# Patient Record
Sex: Female | Born: 1960 | Race: White | Hispanic: No | Marital: Married | Smoking: Former smoker
Health system: Southern US, Community
[De-identification: ages and names within clinical notes are randomized; demographics above are authoritative.]

## PROBLEM LIST (undated history)

## (undated) DIAGNOSIS — L219 Seborrheic dermatitis, unspecified: Secondary | ICD-10-CM

## (undated) DIAGNOSIS — K219 Gastro-esophageal reflux disease without esophagitis: Secondary | ICD-10-CM

## (undated) DIAGNOSIS — Z780 Asymptomatic menopausal state: Secondary | ICD-10-CM

## (undated) DIAGNOSIS — N2889 Other specified disorders of kidney and ureter: Secondary | ICD-10-CM

## (undated) DIAGNOSIS — I10 Essential (primary) hypertension: Secondary | ICD-10-CM

## (undated) DIAGNOSIS — K8020 Calculus of gallbladder without cholecystitis without obstruction: Secondary | ICD-10-CM

## (undated) DIAGNOSIS — D649 Anemia, unspecified: Secondary | ICD-10-CM

## (undated) DIAGNOSIS — R7303 Prediabetes: Secondary | ICD-10-CM

## (undated) DIAGNOSIS — R911 Solitary pulmonary nodule: Secondary | ICD-10-CM

## (undated) DIAGNOSIS — K862 Cyst of pancreas: Secondary | ICD-10-CM

## (undated) DIAGNOSIS — Z862 Personal history of diseases of the blood and blood-forming organs and certain disorders involving the immune mechanism: Secondary | ICD-10-CM

## (undated) DIAGNOSIS — Z8639 Personal history of other endocrine, nutritional and metabolic disease: Secondary | ICD-10-CM

## (undated) DIAGNOSIS — M1990 Unspecified osteoarthritis, unspecified site: Secondary | ICD-10-CM

## (undated) DIAGNOSIS — M25511 Pain in right shoulder: Secondary | ICD-10-CM

## (undated) DIAGNOSIS — N183 Chronic kidney disease, stage 3 unspecified: Secondary | ICD-10-CM

## (undated) DIAGNOSIS — G709 Myoneural disorder, unspecified: Secondary | ICD-10-CM

## (undated) DIAGNOSIS — Z905 Acquired absence of kidney: Secondary | ICD-10-CM

## (undated) DIAGNOSIS — T7840XA Allergy, unspecified, initial encounter: Secondary | ICD-10-CM

## (undated) DIAGNOSIS — M109 Gout, unspecified: Secondary | ICD-10-CM

## (undated) DIAGNOSIS — R0789 Other chest pain: Secondary | ICD-10-CM

## (undated) DIAGNOSIS — C189 Malignant neoplasm of colon, unspecified: Secondary | ICD-10-CM

## (undated) DIAGNOSIS — Z8739 Personal history of other diseases of the musculoskeletal system and connective tissue: Secondary | ICD-10-CM

## (undated) DIAGNOSIS — Z9289 Personal history of other medical treatment: Secondary | ICD-10-CM

## (undated) HISTORY — DX: Allergy, unspecified, initial encounter: T78.40XA

## (undated) HISTORY — PX: LIVER SURGERY: SHX698

## (undated) HISTORY — DX: Other chest pain: R07.89

## (undated) HISTORY — DX: Acquired absence of kidney: Z90.5

## (undated) HISTORY — DX: Anemia, unspecified: D64.9

## (undated) HISTORY — DX: Myoneural disorder, unspecified: G70.9

## (undated) HISTORY — DX: Personal history of diseases of the blood and blood-forming organs and certain disorders involving the immune mechanism: Z86.2

## (undated) HISTORY — DX: Solitary pulmonary nodule: R91.1

## (undated) HISTORY — DX: Chronic kidney disease, stage 3 (moderate): N18.3

## (undated) HISTORY — DX: Personal history of other endocrine, nutritional and metabolic disease: Z86.39

## (undated) HISTORY — DX: Gastro-esophageal reflux disease without esophagitis: K21.9

## (undated) HISTORY — PX: POLYPECTOMY: SHX149

## (undated) HISTORY — DX: Prediabetes: R73.03

## (undated) HISTORY — DX: Gout, unspecified: M10.9

## (undated) HISTORY — DX: Personal history of other medical treatment: Z92.89

## (undated) HISTORY — DX: Personal history of other diseases of the musculoskeletal system and connective tissue: Z87.39

## (undated) HISTORY — DX: Pain in right shoulder: M25.511

## (undated) HISTORY — DX: Essential (primary) hypertension: I10

## (undated) HISTORY — DX: Other specified disorders of kidney and ureter: N28.89

## (undated) HISTORY — DX: Chronic kidney disease, stage 3 unspecified: N18.30

## (undated) HISTORY — DX: Asymptomatic menopausal state: Z78.0

## (undated) HISTORY — DX: Calculus of gallbladder without cholecystitis without obstruction: K80.20

## (undated) HISTORY — PX: OTHER SURGICAL HISTORY: SHX169

## (undated) HISTORY — DX: Cyst of pancreas: K86.2

## (undated) HISTORY — DX: Seborrheic dermatitis, unspecified: L21.9

## (undated) HISTORY — DX: Unspecified osteoarthritis, unspecified site: M19.90

---
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
## 2001-08-12 HISTORY — PX: BREAST REDUCTION SURGERY: SHX8

## 2002-08-12 HISTORY — PX: LUMBAR LAMINECTOMY: SHX95

## 2012-07-24 ENCOUNTER — Encounter: Payer: Self-pay | Admitting: Family Medicine

## 2012-07-24 ENCOUNTER — Ambulatory Visit (INDEPENDENT_AMBULATORY_CARE_PROVIDER_SITE_OTHER): Payer: Managed Care, Other (non HMO) | Admitting: Family Medicine

## 2012-07-24 VITALS — BP 155/79 | HR 83 | Temp 98.2°F | Wt 189.0 lb

## 2012-07-24 DIAGNOSIS — J069 Acute upper respiratory infection, unspecified: Secondary | ICD-10-CM

## 2012-07-24 DIAGNOSIS — J209 Acute bronchitis, unspecified: Secondary | ICD-10-CM

## 2012-07-24 MED ORDER — ALBUTEROL SULFATE HFA 108 (90 BASE) MCG/ACT IN AERS: 2.0000 | INHALATION_SPRAY | Freq: Four times a day (QID) | RESPIRATORY_TRACT | Status: DC | PRN

## 2012-07-24 MED ORDER — BENZONATATE 200 MG PO CAPS: 200.0000 mg | ORAL_CAPSULE | Freq: Three times a day (TID) | ORAL | Status: DC | PRN

## 2012-07-24 NOTE — Progress Notes (Signed)
Office Note 07/26/2012  CC:  Chief Complaint  Patient presents with  . Establish Care    cough x 1 week    HPI:  Elaine Jenkins is a 51 y.o. White female who is here to establish care and discuss recent respiratory complaints. Patient's most recent primary MD: Dr. Sherryll Burger in [place], [place].  Nephrologist is Dr. Darel Hong in [place]. Old records were not reviewed prior to or during today's visit.  Pt presents complaining of respiratory symptoms for 8  days.  Primary symptoms are: nasal mucous, sinus congestion, cough.  Worst symptoms seems to be the cough.  Lately the symptoms seem to be improving some until last night when she had a bad coughing spell.  Feels pretty good today.  Question of fevers--"sweats at night". Spits out clear mucous.  She hears some intermittent wheezing that somewhat clears with position change. Pertinent negatives: no chest tightness or chest pain. Symptoms made worse by night time, talking.  Symptoms improved by nothing (no meds tried). Smoker? no Recent sick contact? unknown Muscle or joint aches? no Flu shot this season at least 2 wks ago? yes  Additional ROS: no n/v/d or abdominal pain.  No rash.  No neck stiffness.   +Mild fatigue.  +Mild appetite loss.     Past Medical History  Diagnosis Date  . HTN (hypertension)   . Gout   . 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 History of subacute thyroiditis   . History of anemia   . Postmenopausal   . History of blood transfusion 1963    Past Surgical History  Procedure Date  . Appendectomy 1964  . Nephrectomy 1964    Wilms tumor (right)  . Back surgery 2001    microdiscectomy  . Breast reduction surgery 1999    bilateral    Family History  Problem Relation Age of Onset  . Hypertension Mother   . Heart disease Mother   . Hypertension Father   . Heart disease Father     History   Social History  . Marital Status: Married    Spouse Name: N/A    Number of  Children: N/A  . Years of Education: N/A   Occupational History  . Not on file.   Social History Main Topics  . Smoking status: Former Games developer  . Smokeless tobacco: Never Used  . Alcohol Use: Yes     Comment: rare  . Drug Use: No  . Sexually Active: Not on file   Other Topics Concern  . 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 No Tobacco.  Rare alcohol.  No drug use.     MEDS: Diovan 160mg  qd, Uloric 40mg  po qd  Allergies  Allergen Reactions  . Aldomet (Methyldopa)     Severe bone suppression  . Latex Rash    Blisters, rash    ROS Review of Systems  Constitutional: Negative for fever and fatigue.  HENT: Negative for sore throat.        See HPI  Eyes: Negative for visual disturbance.  Respiratory:       See HPI  Cardiovascular: Negative for chest pain.  Gastrointestinal: Negative for nausea and abdominal pain.  Genitourinary: Negative for dysuria.  Musculoskeletal: Negative for back pain and joint swelling.  Skin: Negative for rash.  Neurological: Negative for weakness and headaches.  Hematological: Negative for adenopathy.  PE; Blood pressure 155/79, pulse 83, temperature 98.2 F (36.8 C), temperature source Temporal, weight 189 lb (85.73 kg), SpO2 97.00%. VS: noted--normal except systolic bp up slightly. Gen: alert, NAD, NONTOXIC APPEARING. HEENT: eyes without injection, drainage, or swelling.  Ears: EACs clear, TMs with normal light reflex and landmarks.  Nose: Clear rhinorrhea, with some dried, crusty exudate adherent to mildly injected mucosa.  No purulent d/c.  No paranasal sinus TTP.  No facial swelling.  Throat and mouth without focal lesion.  No pharyngial swelling, erythema, or exudate.   Neck: supple, no LAD.   LUNGS: CTA bilat, nonlabored resps.  Mild prolongation of expiratory phase.  No significant post-exhalation coughing. CV: RRR, no  m/r/g. ABD: soft, NT, ND, BS normal.  No HSM, no mass, no bruit. EXT: no c/c/e SKIN: no rash   Pertinent labs:  None today  ASSESSMENT AND PLAN:   New pt: obtain both PMD and nephrologist's old records.  Acute bronchitis With URI as well, plus the slightest hint of RAD. No systemic steroids warranted.  No sign of bacterial infection. Rx'd tessalon perles 200mg  q8h prn and gave a sample of ventolin HFA to use 2 puffs q6h prn wheezing/dry cough/chest tightness.   HTN: historically good control.  Will not change meds based on today's bp reading but encouraged pt to monitor bp regularly between now and next f/u and bring these in for review.  With one kidney we need to have an aggressive bp goal of <130/80 with her.  Gout: well controlled since getting on uloric.  Continue this med.    She'll return in 7 or more days when feeling better and get fasting labs (30 min app and we'll fax copies to her nephrologist in [place], whom she'll continue to see periodically.  Return for f/u in 7 or more days--30 min appt.

## 2012-07-26 ENCOUNTER — Encounter: Payer: Self-pay | Admitting: Family Medicine

## 2012-07-26 DIAGNOSIS — J209 Acute bronchitis, unspecified: Secondary | ICD-10-CM | POA: Insufficient documentation

## 2012-07-26 NOTE — Assessment & Plan Note (Signed)
With URI as well, plus the slightest hint of RAD. No systemic steroids warranted.  No sign of bacterial infection. Rx'd tessalon perles 200mg  q8h prn and gave a sample of ventolin HFA to use 2 puffs q6h prn wheezing/dry cough/chest tightness.

## 2012-08-06 ENCOUNTER — Ambulatory Visit: Payer: Managed Care, Other (non HMO) | Admitting: Family Medicine

## 2012-08-18 ENCOUNTER — Ambulatory Visit: Payer: Managed Care, Other (non HMO) | Admitting: Family Medicine

## 2012-08-24 ENCOUNTER — Encounter: Payer: Self-pay | Admitting: Family Medicine

## 2012-09-02 ENCOUNTER — Ambulatory Visit (INDEPENDENT_AMBULATORY_CARE_PROVIDER_SITE_OTHER): Payer: BC Managed Care – PPO | Admitting: Family Medicine

## 2012-09-02 ENCOUNTER — Encounter: Payer: Self-pay | Admitting: Family Medicine

## 2012-09-02 VITALS — BP 144/81 | HR 97 | Wt 191.0 lb

## 2012-09-02 DIAGNOSIS — R21 Rash and other nonspecific skin eruption: Secondary | ICD-10-CM

## 2012-09-02 DIAGNOSIS — Z905 Acquired absence of kidney: Secondary | ICD-10-CM

## 2012-09-02 DIAGNOSIS — N189 Chronic kidney disease, unspecified: Secondary | ICD-10-CM

## 2012-09-02 DIAGNOSIS — M109 Gout, unspecified: Secondary | ICD-10-CM

## 2012-09-02 LAB — CBC WITH DIFFERENTIAL/PLATELET
Basophils Absolute: 0.1 10*3/uL (ref 0.0–0.1)
Basophils Relative: 1.2 % (ref 0.0–3.0)
Eosinophils Absolute: 0.2 10*3/uL (ref 0.0–0.7)
Hemoglobin: 12.7 g/dL (ref 12.0–15.0)
Lymphocytes Relative: 27.5 % (ref 12.0–46.0)
MCHC: 33.3 g/dL (ref 30.0–36.0)
Monocytes Relative: 6.5 % (ref 3.0–12.0)
Neutro Abs: 3.8 10*3/uL (ref 1.4–7.7)
Neutrophils Relative %: 62.3 % (ref 43.0–77.0)
RBC: 4.3 Mil/uL (ref 3.87–5.11)

## 2012-09-02 LAB — COMPREHENSIVE METABOLIC PANEL
ALT: 23 U/L (ref 0–35)
AST: 22 U/L (ref 0–37)
Albumin: 3.9 g/dL (ref 3.5–5.2)
BUN: 22 mg/dL (ref 6–23)
CO2: 26 mEq/L (ref 19–32)
Calcium: 9.2 mg/dL (ref 8.4–10.5)
Chloride: 107 mEq/L (ref 96–112)
Creatinine, Ser: 1 mg/dL (ref 0.4–1.2)
GFR: 59.1 mL/min — ABNORMAL LOW (ref >60.00)
Potassium: 4.4 mEq/L (ref 3.5–5.1)

## 2012-09-02 MED ORDER — MUPIROCIN 2 % EX OINT: TOPICAL_OINTMENT | Freq: Three times a day (TID) | CUTANEOUS | Status: DC

## 2012-09-02 NOTE — Progress Notes (Signed)
OFFICE NOTE  09/02/2012  CC:  Chief Complaint  Patient presents with  . Follow-up    fasting labs     HPI: Patient is a 52 y.o. Caucasian female who is here for fasting labs. Resp sx's gone, didn't even have to fill rx's I gave her last visit. Still having little "zits" come up around her eyes, R>L (5 wks hx).  A bit itchy just prior to onset of a lesion, but then they don't bother her.  She is worried b/c they are close to her eyes.  Applying cortisone 1/% and sometimes a cream from a spa.  Pertinent PMH:  Past Medical History  Diagnosis Date  . HTN (hypertension)     since age 69.  Also hx of PIH.  Marland Kitchen Gout     Uric acid level decreased from 9 to 4.1 with addition of uloric 40 mg qd (02/2011).  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 History of subacute thyroiditis   . History of anemia   . Postmenopausal   . History of blood transfusion 1963  . Chronic renal insufficiency, stage I     Stage I/II.  No proteinuria.  ARB added 12/2010 by nephrologist for renal protection    MEDS:  Outpatient Prescriptions Prior to Visit  Medication Sig Dispense Refill  . colchicine 0.6 MG tablet Take 0.6 mg by mouth 2 (two) times daily. PRN use for gout flare      . febuxostat (ULORIC) 40 MG tablet Take 40 mg by mouth daily.      . valsartan (DIOVAN) 160 MG tablet Take 160 mg by mouth daily.      . [DISCONTINUED] albuterol (VENTOLIN HFA) 108 (90 BASE) MCG/ACT inhaler Inhale 2 puffs into the lungs every 6 (six) hours as needed for wheezing.  1 Inhaler  0  . [DISCONTINUED] benzonatate (TESSALON) 200 MG capsule Take 1 capsule (200 mg total) by mouth 3 (three) times daily as needed for cough.  30 capsule  1   Last reviewed on 09/02/2012 10:59 AM by Jeoffrey Massed, MD  PE: Blood pressure 144/81, pulse 97, weight 191 lb (86.637 kg). Gen: Alert, well appearing.  Patient is oriented to person, place, time, and situation. CV: RRR, no m/r/g.   LUNGS: CTA  bilat, nonlabored resps, good aeration in all lung fields. FACE: 6-8 scattered erythematous papules in periorbital region/eyelids.  No eye d/c.  No periorbital swelling.  No conjunctival injection  IMPRESSION AND PLAN:  1) Unilateral kidney, s/p nephrectomy for Wilm's tumor, stage I/II CRI. Labs done today to fax to her nephrologist (CBC, CMET, Mag, phos, and uric acid level), Dr. Darel Hong, in [place], [place].  2) Bronchitis--resolved  3) Facial lesions/rash: unclear etiology, unresponsive to low potency topical steroid.  Will try to expedite derm referral (new ref to Dr. Terri Piedra) since the only one she has been able to get is in mid February. I also rx'd bactroban ointment to treat empirically in case these are impetigo lesions.   FOLLOW UP: 58mo

## 2012-09-23 ENCOUNTER — Encounter: Payer: Self-pay | Admitting: Family Medicine

## 2012-11-10 ENCOUNTER — Telehealth: Payer: Self-pay | Admitting: Family Medicine

## 2012-11-10 MED ORDER — VALACYCLOVIR HCL 1 G PO TABS: ORAL_TABLET | ORAL | Status: DC

## 2012-11-10 NOTE — Telephone Encounter (Signed)
Valtrex rx sent to pharmacy.

## 2012-11-10 NOTE — Telephone Encounter (Signed)
Please advise 

## 2012-11-10 NOTE — Telephone Encounter (Signed)
Patient has a cold sore. She has run out of old Rx of Valtrex from previous PCP. It has worked very well for her in the past. She was unsure of the strength of the old Rx but that it should be in her records from prior PCP.

## 2013-03-17 ENCOUNTER — Ambulatory Visit: Payer: BC Managed Care – PPO | Admitting: Family Medicine

## 2013-04-29 ENCOUNTER — Ambulatory Visit (INDEPENDENT_AMBULATORY_CARE_PROVIDER_SITE_OTHER): Payer: BC Managed Care – PPO | Admitting: Family Medicine

## 2013-04-29 ENCOUNTER — Encounter: Payer: Self-pay | Admitting: Family Medicine

## 2013-04-29 VITALS — BP 125/85 | HR 80 | Temp 98.6°F | Resp 18 | Ht 64.75 in | Wt 191.0 lb

## 2013-04-29 DIAGNOSIS — M545 Low back pain, unspecified: Secondary | ICD-10-CM

## 2013-04-29 DIAGNOSIS — S335XXA Sprain of ligaments of lumbar spine, initial encounter: Secondary | ICD-10-CM

## 2013-04-29 DIAGNOSIS — Z23 Encounter for immunization: Secondary | ICD-10-CM

## 2013-04-29 DIAGNOSIS — S39012A Strain of muscle, fascia and tendon of lower back, initial encounter: Secondary | ICD-10-CM | POA: Insufficient documentation

## 2013-04-29 MED ORDER — CYCLOBENZAPRINE HCL 10 MG PO TABS: ORAL_TABLET | ORAL | Status: DC

## 2013-04-29 MED ORDER — TRAMADOL HCL 50 MG PO TABS: ORAL_TABLET | ORAL | Status: DC

## 2013-04-29 NOTE — Progress Notes (Signed)
OFFICE NOTE  04/29/2013  CC:  Chief Complaint  Patient presents with  . Back Pain    x Saturday     HPI: Patient is a 52 y.o. Caucasian female who is here for back pain. Onset 6 days ago, has been doing a lot of housework lately due to a problem in her home, focus of pain in right low back and right glut region.  Sx's severe at times--with prolonged sitting or standing. Worse when she has to get up and move any.  No radiation of the pain down leg, no tingling or numbness down legs.  No perineal numbness or loss of bowel or bladder control. Has taken one dose of NSAID per day lately, helps some.  Tylenol no help. Back feels better today.  Pertinent PMH:  Past Medical History  Diagnosis Date  . HTN (hypertension)     since age 48.  Also hx of PIH.  Marland Kitchen Gout     Uric acid level decreased from 9 to 4.1 with addition of uloric 40 mg qd (02/2011).  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 History of subacute thyroiditis   . History of anemia   . Postmenopausal   . History of blood transfusion 1963  . Chronic renal insufficiency, stage I     Stage I/II.  No proteinuria.  ARB added 12/2010 by nephrologist for renal protection  . Seborrheic dermatitis     Face; consider contact derm (around eyes); [facility] Hockey 09/2012--desonide 0.05% cream rx'd   Past Surgical History  Procedure Laterality Date  . Appendectomy  1964  . Nephrectomy  1964    Wilms tumor (right)  . Lumbar laminectomy  2004    microdiscectomy  . Breast reduction surgery  2003    bilateral  . Cesarean section  1998     MEDS:  Outpatient Prescriptions Prior to Visit  Medication Sig Dispense Refill  . colchicine 0.6 MG tablet Take 0.6 mg by mouth 2 (two) times daily. PRN use for gout flare      . febuxostat (ULORIC) 40 MG tablet Take 40 mg by mouth daily.      . valACYclovir (VALTREX) 1000 MG tablet 2 tabs po q12h x 2 doses  4 tablet  6  . valsartan (DIOVAN) 160 MG tablet  Take 160 mg by mouth daily.      . mupirocin ointment (BACTROBAN) 2 % Apply topically 3 (three) times daily. Apply to affected areas tid  15 g  1   No facility-administered medications prior to visit.    PE: Blood pressure 125/85, pulse 80, temperature 98.6 F (37 C), temperature source Temporal, resp. rate 18, height 5' 4.75" (1.645 m), weight 191 lb (86.637 kg), SpO2 98.00%. Gen: alert, well-appearing.  Any movement is done slowly and appears to hurt. Gets up on exam table with minimal assistance, though. No significant lumbosacral TTP.   LE strength intact 5-/5---limited only by pain with movement against resistance. Patellar and achilles DTRs 1+ bilat. Sitting SLR brings pain in right low back at 15 deg on left leg and at 75 deg on right leg.  No radicular pain elicited by SLR on either side.  IMPRESSION AND PLAN:  Lumbar strain PT referral--start ASAP. Must avoid NSAIDs due to having only one kidney. Tramadol 50mg , 1-2 q6h prn, #30, no RF. Flexeril 10mg , 1/2-1 q8h prn, #30, no RF.  Therapeutic expectations and side effect profile of medications discussed today.  Patient's questions answered. I suggested she try one OR the other of these meds and see which one helps the best (it sounds like she may be having muscle spasms). F/u in office in 2 wks.    Flu vaccine IM today.  An After Visit Summary was printed and given to the patient.  FOLLOW UP: 2 wks.

## 2013-04-29 NOTE — Assessment & Plan Note (Signed)
PT referral--start ASAP. Must avoid NSAIDs due to having only one kidney. Tramadol 50mg , 1-2 q6h prn, #30, no RF. Flexeril 10mg , 1/2-1 q8h prn, #30, no RF.  Therapeutic expectations and side effect profile of medications discussed today.  Patient's questions answered. I suggested she try one OR the other of these meds and see which one helps the best (it sounds like she may be having muscle spasms). F/u in office in 2 wks.

## 2013-05-12 ENCOUNTER — Telehealth: Payer: Self-pay | Admitting: Family Medicine

## 2013-05-12 DIAGNOSIS — M5416 Radiculopathy, lumbar region: Secondary | ICD-10-CM

## 2013-05-12 DIAGNOSIS — Z9889 Other specified postprocedural states: Secondary | ICD-10-CM

## 2013-05-12 NOTE — Telephone Encounter (Signed)
Patient states that she'll go anywhere that can get her seen the fastest.   If you order MRI, Diane will precert and we will call around to see who can see her.

## 2013-05-12 NOTE — Telephone Encounter (Signed)
Patient has been to PT and has been using muscle relaxer.  Patient states that she is having shooting pain down left leg when she woke up in the am.  After Pt on Monday, pt has had unbearable pain in her back and leg.  Pt took a muscle relaxer and a motrin, she's been icing the pain also.   Patient okay when she's sitting but any movement is awful.   Patient does not like tramadol because it makes her dizzy and it doesn't seem to help pain much.  Patient does not want any stronger medication, she just wants MRI to make sure that her spinal cord is okay.  Patient states that she is in too much pain to come to office.  Please advise.

## 2013-05-12 NOTE — Telephone Encounter (Signed)
MRI ordered for [facility].

## 2013-05-12 NOTE — Telephone Encounter (Signed)
OK.  Will order MRI but find out if she'll go to [facility] or should I order it at another location? -thxa

## 2013-05-12 NOTE — Telephone Encounter (Signed)
I called the patient to advise her insurance has not approved the MRI yet however we are still working on it. I also advised the patient that her insurance advised that [facility] is a preferred provider. The insurance agent did advise that the patient can go to [facility] however they will not cover it at the same rate. I advised the patient to contact her insurance company if she had any further questions & that to call me back if she wants me to set up the MRI at [facility]. At this point the patient does not want to drive to [place].

## 2013-05-12 NOTE — Telephone Encounter (Signed)
I advised patient that she would need an OV in our office to further access her symptoms. Dr. Milinda Cave has offerred to set up an orthopedic referral for her. Patient is going to contact the orthopedic office directly. I gave them the phone #'s for [facility], and [place]. Patient also discussed the possibility of going to the ER if her pain worsens. Patient will CB if they need to schedule an OV in our office.

## 2013-05-13 ENCOUNTER — Encounter: Payer: Self-pay | Admitting: Family Medicine

## 2013-05-13 ENCOUNTER — Ambulatory Visit (INDEPENDENT_AMBULATORY_CARE_PROVIDER_SITE_OTHER): Payer: BC Managed Care – PPO | Admitting: Family Medicine

## 2013-05-13 VITALS — BP 127/84 | HR 90 | Temp 97.7°F | Resp 16 | Ht 64.75 in | Wt 191.0 lb

## 2013-05-13 DIAGNOSIS — IMO0002 Reserved for concepts with insufficient information to code with codable children: Secondary | ICD-10-CM

## 2013-05-13 DIAGNOSIS — M5416 Radiculopathy, lumbar region: Secondary | ICD-10-CM

## 2013-05-13 MED ORDER — PREDNISONE 20 MG PO TABS: ORAL_TABLET | ORAL | Status: DC

## 2013-05-13 NOTE — Progress Notes (Signed)
OFFICE NOTE  05/13/2013  CC:  Chief Complaint  Patient presents with  . Back Pain    low back   . Leg Pain    left leg, numbness in foot and toes.      HPI: Patient is a 52 y.o. Caucasian female who is here for 2 week f/u LBP. Pain is worsening despite PT trial.  Tramadol and flexeril no help. NSAIDs avoided due to her having only one kidney.  She recently called with request of MRI (initially declining our request that she come in for o/v) but insurer declined to cover this due to lack of info. She declined stronger pain med trial and ortho referral yesterday. Here to discuss more today.   Pain worse in morning for 2-4 hours, also with any activity, feels it severely in L/S spine area (> on left) and it radiates down left leg posterolaterally to mid calf level, also feels decreased sensation in left leg posterolaterally, primarily in lower calf and on bottom of left foot and 3rd-5th toes.  Feels weak in left lower leg.  She felt a little improvement last night after using ibuprofen 400 q6h and again the pain was not as bad this morning (with 50mg  tramadol + ibup). Denies saddle anesthesia, denies incontinence or urinary retention.    Pertinent PMH:  Past Medical History  Diagnosis Date  . HTN (hypertension)     since age 47.  Also hx of PIH.  Marland Kitchen Gout     Uric acid level decreased from 9 to 4.1 with addition of uloric 40 mg qd (02/2011).  Marland Kitchen History of nephrectomy, unilateral 1964    Wilms tumor at age 57 (left kidney)  . History of subacute thyroiditis   . History of anemia   . Postmenopausal   . History of blood transfusion 1963  . Chronic renal insufficiency, stage I     Stage I/II.  No proteinuria.  ARB added 12/2010 by nephrologist for renal protection  . Seborrheic dermatitis     Face; consider contact derm (around eyes); [facility] Hockey 09/2012--desonide 0.05% cream rx'd   Past Surgical History  Procedure Laterality Date  . Appendectomy  1964  . Nephrectomy  1964    Wilms  tumor (right)  . Lumbar laminectomy  2004    microdiscectomy  . Breast reduction surgery  2003    bilateral  . Cesarean section  1998    MEDS:  Outpatient Prescriptions Prior to Visit  Medication Sig Dispense Refill  . febuxostat (ULORIC) 40 MG tablet Take 40 mg by mouth daily.      Marland Kitchen MIRVASO 0.33 % GEL       . Sulfacetamide Sodium-Sulfur 10-5 % EMUL       . traMADol (ULTRAM) 50 MG tablet 1-2 tabs po q6h prn pain  30 tablet  0  . valACYclovir (VALTREX) 1000 MG tablet 2 tabs po q12h x 2 doses  4 tablet  6  . valsartan (DIOVAN) 160 MG tablet Take 160 mg by mouth daily.      . colchicine 0.6 MG tablet Take 0.6 mg by mouth 2 (two) times daily. PRN use for gout flare      . cyclobenzaprine (FLEXERIL) 10 MG tablet 1/2-1 tab po q8h prn for muscle spasms  30 tablet  0  . mupirocin ointment (BACTROBAN) 2 % Apply topically 3 (three) times daily. Apply to affected areas tid  15 g  1   No facility-administered medications prior to visit.    PE: Blood  pressure 127/84, pulse 90, temperature 97.7 F (36.5 C), temperature source Temporal, resp. rate 16, height 5' 4.75" (1.645 m), weight 191 lb (86.637 kg), SpO2 98.00%. Gen: Alert, appears to be in pain but NAD.  Patient is oriented to person, place, time, and situation. Low back: mild soft tissue tenderness in lumbosacral intersection.  NO spinous process or facet region TTP. ROM: decreased flexion to about 10 deg secondary to LB pain and pain rad down left leg.  All other L-spine ROM intact. LE strength: 5/5 in prox and dist mm's on right.  4/5 prox and dist on left (a bit weaker dist than prox), she cannot do a toe-raise on left. Sensation decreased with monofilament testing in lateral left ankle area, left foot plantar surface, plantar surface of 3rd-5th toes on left. Left foot feels cooler than right, but no color difference and pulses are 2+ DP and PT bilat. No tenderness around ischial tuberosity on either side. DTRs: cannot elicit any on  right knee, only trace on left knee.  Right achilles 1+, left achilles absent. Sitting SLR neg on right, and it elicits LB pain radiating down left leg when left leg is extended to about 45 deg.   IMPRESSION AND PLAN:  Acute low back pain/strain, now worsening after short course of PT ---with signs of spinal nerve impingement in lower lumbar/upper sacral levels.  Discussed options in detail today and decided on the following plan:  Continue tramadol prn.  Stop ibuprofen and do prednisone 40mg  po qd x 5d. MRI L/S spine this afternoon is set up. Referral appt with Dr. Grant Fontana PA set for 05/19/13. Signs/symptoms to call or return for were reviewed and pt expressed understanding.  An After Visit Summary was printed and given to the patient.  FOLLOW UP: prn with me--

## 2013-05-15 ENCOUNTER — Ambulatory Visit
Admission: RE | Admit: 2013-05-15 | Discharge: 2013-05-15 | Disposition: A | Discharge status: Home / Self Care | Payer: BC Managed Care – PPO | Source: Ambulatory Visit | Attending: Family Medicine | Admitting: Family Medicine

## 2013-05-15 DIAGNOSIS — Z9889 Other specified postprocedural states: Secondary | ICD-10-CM

## 2013-05-15 DIAGNOSIS — M5416 Radiculopathy, lumbar region: Secondary | ICD-10-CM

## 2013-05-19 ENCOUNTER — Other Ambulatory Visit (HOSPITAL_COMMUNITY): Payer: Self-pay | Admitting: *Deleted

## 2013-05-20 ENCOUNTER — Ambulatory Visit (HOSPITAL_COMMUNITY)
Admission: RE | Admit: 2013-05-20 | Discharge: 2013-05-20 | Disposition: A | Discharge status: Home / Self Care | Payer: BC Managed Care – PPO | Source: Ambulatory Visit | Attending: Surgical | Admitting: Surgical

## 2013-05-20 ENCOUNTER — Encounter (HOSPITAL_COMMUNITY): Payer: Self-pay

## 2013-05-20 ENCOUNTER — Encounter (HOSPITAL_COMMUNITY)
Admission: RE | Admit: 2013-05-20 | Discharge: 2013-05-20 | Disposition: A | Discharge status: Home / Self Care | Payer: BC Managed Care – PPO | Source: Ambulatory Visit | Attending: Orthopedic Surgery | Admitting: Orthopedic Surgery

## 2013-05-20 ENCOUNTER — Encounter (HOSPITAL_COMMUNITY): Payer: Self-pay | Admitting: Pharmacy Technician

## 2013-05-20 LAB — COMPREHENSIVE METABOLIC PANEL
ALT: 22 U/L (ref 0–35)
AST: 16 U/L (ref 0–37)
Albumin: 3.4 g/dL — ABNORMAL LOW (ref 3.5–5.2)
Alkaline Phosphatase: 102 U/L (ref 39–117)
BUN: 35 mg/dL — ABNORMAL HIGH (ref 6–23)
CO2: 22 mEq/L (ref 19–32)
Calcium: 9.3 mg/dL (ref 8.4–10.5)
Chloride: 102 mEq/L (ref 96–112)
Creatinine, Ser: 1.17 mg/dL — ABNORMAL HIGH (ref 0.50–1.10)
GFR calc Af Amer: 61 mL/min — ABNORMAL LOW (ref >90)
GFR calc non Af Amer: 53 mL/min — ABNORMAL LOW (ref >90)
Glucose, Bld: 91 mg/dL (ref 70–99)
Potassium: 3.6 mEq/L (ref 3.5–5.1)
Sodium: 136 mEq/L (ref 135–145)
Total Bilirubin: 0.2 mg/dL — ABNORMAL LOW (ref 0.3–1.2)
Total Protein: 6.6 g/dL (ref 6.0–8.3)

## 2013-05-20 LAB — CBC
HCT: 38.4 % (ref 36.0–46.0)
Hemoglobin: 12.7 g/dL (ref 12.0–15.0)
MCHC: 33.1 g/dL (ref 30.0–36.0)
RBC: 4.25 MIL/uL (ref 3.87–5.11)

## 2013-05-20 LAB — URINALYSIS, ROUTINE W REFLEX MICROSCOPIC
Bilirubin Urine: NEGATIVE
Glucose, UA: NEGATIVE mg/dL
Hgb urine dipstick: NEGATIVE
Ketones, ur: NEGATIVE mg/dL
Leukocytes, UA: NEGATIVE
Nitrite: NEGATIVE
Protein, ur: NEGATIVE mg/dL
Specific Gravity, Urine: 1.019 (ref 1.005–1.030)
Urobilinogen, UA: 0.2 mg/dL (ref 0.0–1.0)
pH: 6 (ref 5.0–8.0)

## 2013-05-20 LAB — PROTIME-INR
INR: 0.95 (ref 0.00–1.49)
Prothrombin Time: 12.5 seconds (ref 11.6–15.2)

## 2013-05-20 LAB — SURGICAL PCR SCREEN: Staphylococcus aureus: NEGATIVE

## 2013-05-20 LAB — APTT: aPTT: 23 seconds — ABNORMAL LOW (ref 24–37)

## 2013-05-20 NOTE — H&P (Signed)
Elaine Jenkins DOB: [birth year]   Chief Complaint: back pain  History of Present Illness The patient is a 52 year old female who presents with back pain. The patient reports low back symptoms including pain, low back pain, spasms, numbness, tingling and weakness and colder than other foot which began 3 weeks ago without any known injury. Symptoms are reported to be located on the left side more than the right and Symptoms include pain, muscle spasm, paresthesias, numbness, burning, weakness and pain in the calf. The pain radiates to the left buttock, left thigh, left posterior thigh, left lower leg and left posterior lower leg. The patient describes the pain as aching. The symptom onset was gradual. The patient describes the severity of their symptoms as moderate in severity. The patient feels as if the symptoms are worsening. Symptoms are exacerbated by standing and sitting. Symptoms are relieved by recumbency and nonsteroidal anti-inflammatory drugs. Current treatment includes nonsteroidal anti-inflammatory drugs. Past treatment has included nonsteroidal anti-inflammatory drugs, muscle relaxants, physical therapy and back surgery. She noticed this after cleaning one day. She has had previous back surgery on L4-5 in [place]. She has had physical therapy which made it worse. The prednisone has helped but she continue to have pain in the low back as well as weakness in the left LE. MRI shows   Allergies Aldomet  Latex Seafood.    Family History Cancer. sister, brother, grandmother mothers side and grandmother fathers side Chronic Obstructive Lung Disease. mother Congestive Heart Failure. mother Diabetes Mellitus. brother Drug / Alcohol Addiction. brother Heart Disease. father Hypertension. father, sister and brother Rheumatoid Arthritis. brother   Social History Alcohol use. current drinker; drinks wine; only occasionally per week Children. 1 Current work  status. unemployed Exercise. Exercises weekly; does running / walking Illicit drug use. no Living situation. live with spouse Marital status. married Number of flights of stairs before winded. 2-3 Pain Contract. no Tobacco / smoke exposure. no Tobacco use. former smoker; smoke(d) less than 1/2 pack(s) per day   Medication History TraMADol HCl (50MG  Tablet, Oral) Active. Cyclobenzaprine HCl (10MG  Tablet, Oral) Active. Diovan (160MG  Tablet, Oral) Active. Finacea (15% Gel, External) Active. Mirvaso (0.33% Gel, External) Active. PredniSONE (20MG  Tablet, Oral) Active. Sulfacetamide Sodium-Sulfur (10-5% Emulsion, External) Active. Uloric (40MG  Tablet, Oral) Active. ValACYclovir HCl (1GM Tablet, Oral as needed) Active. Motrin (40MG /ML Suspension, 1 (one) Oral) Active. Tylenol (325MG  Tablet, 1 (one) Oral) Active. Medications Reconciled.   Past Surgical History Appendectomy Breast Reconstruction. bilateral Cesarean Delivery. 1 time Dilation and Curettage of Uterus Kidney Removal. right Mammoplasty; Reduction. bilateral Spinal Surgery   Past Medical History Anemia Cancer Gout Hypertension Skin Cancer   Review of Systems(Beth C Vernon; 05/18/2013 3:08 PM) General:Not Present- Chills, Fever, Night Sweats, Appetite Loss, Fatigue, Feeling sick, Weight Gain and Weight Loss. Skin:Not Present- Itching, Rash, Skin Color Changes, Ulcer, Psoriasis and Change in Hair or Nails. HEENT:Not Present- Sensitivity to light, Hearing problems, Nose Bleed and Ringing in the Ears. Neck:Not Present- Swollen Glands and Neck Mass. Respiratory:Not Present- Snoring, Chronic Cough, Bloody sputum and Dyspnea. Cardiovascular:Present- Swelling of Extremities and Leg Cramps. Not Present- Shortness of Breath, Chest Pain and Palpitations. Gastrointestinal:Not Present- Bloody Stool, Heartburn, Abdominal Pain, Vomiting, Nausea and Incontinence of Stool. Female Genitourinary:Not  Present- Blood in Urine, Menstrual Irregularities, Frequency, Incontinence and Nocturia. Musculoskeletal:Present- Muscle Weakness and Back Pain. Not Present- Muscle Pain, Joint Stiffness, Joint Swelling and Joint Pain. Neurological:Present- Tingling and Numbness. Not Present- Burning, Tremor, Headaches and Dizziness. Psychiatric:Not Present- Anxiety, Depression and Memory Loss.  Endocrine:Not Present- Cold Intolerance, Heat Intolerance, Excessive hunger and Excessive Thirst. Hematology:Not Present- Abnormal Bleeding, Anemia, Blood Clots and Easy Bruising.    Vitals Weight: 185 lb Height: 65 in Body Surface Area: 1.96 m Body Mass Index: 30.79 kg/m Pulse: 82 (Regular) BP: 143/102 (Sitting, Left Arm, Standard)   Objective  On exam today she has severe pain with back motion. She has a strongly positive straight leg raising on the left. Positive contralateral straight leg raising on the right. Her sensory exam she has a little decreased sensation along the S1 nerve root distribution on the left. She has a good dorsalis pedis pulse. She can dorsiflex, plantar flex her foot but she is in an extreme amount of discomfort. The hips are negative. Knees are negative. Calves are fine. There are no signs of any deep venous thrombosis. Heart sounds are normal. RRR. No murmurs. Lungs clear to auscultation. Abdomen soft and nontender. EOM intact. Oral cavity negative.     RADIOGRAPHS: MRI shows that she has a large extruded disc at L5-S1 that migrated caudalward.    Assessment & Plan Lumbar disc herniation (722.10) She needs to have a hemilaminectomy and microdiscectomy at L5-S1 on the left The possible complications of spinal surgery number one could be infection, which is extremely rare. We do use antibiotics prior to the surgery and during surgery and after surgery. Number two is always a slight degree of probability that you could develop a blood clot in your leg after any type  of surgery and we try our best to prevent that with aspirin post op when it is safe to begin. The third is a dural leak. That is the spinal fluid leak that could occur. At certain rare times the bone or the disc could literally stick to the dura which is the lining which contains the spinal fluid and we could develop a small tear in that lining which we then patch up. That is an extremely rare complication. The last and final complication is a recurrent disc rupture. That means that you could rupture another small piece of disc later on down the road and there is about a 2% chance of that.   Dimitri Ped, PA-C

## 2013-05-20 NOTE — Patient Instructions (Addendum)
Elaine Jenkins  05/20/2013   Your procedure is scheduled on:   Report to [facility] at 12:15 AM.  Call this number if you have problems the morning of surgery: [phone]   Remember:   Do not eat food or drink liquids after midnight.   Take these medicines the morning of surgery with A SIP OF WATER:   Do not wear jewelry, make-up or nail polish.  Do not wear lotions, powders, or perfumes. You may wear deodorant.  Do not shave 48  hours prior to surgery. Men may shave face and neck.  Do not bring valuables to the hospital.  [facility] is not responsible for any belongings or valuables.                 Contacts, dentures or bridgework may not be worn into surgery.  Leave suitcase in the car. After surgery it may be brought to your room.  For patients admitted to the hospital, checkout time is 11:00 AM the day of discharge  .   Patients discharged the day of surgery will not be allowed to drive  home.  Name and phone number of your driver:   Special Instructions:   Shower using CHG 2 nights before surgery and the night before surgery.  If you shower the day of surgery use CHG.  Use special wash - you have one bottle of CHG for all showers.  You should use approximately 1/3 of the bottle for each shower.   Please read over the following fact sheets that you were given: MRSA Information       Questions please call  Johnette Abraham RN     [phone]    Patient signature _______________________________________  Nurse signature ________________________________________

## 2013-05-21 ENCOUNTER — Ambulatory Visit: Admit: 2013-05-21 | Payer: Self-pay | Admitting: Orthopedic Surgery

## 2013-05-21 ENCOUNTER — Ambulatory Visit (HOSPITAL_COMMUNITY): Payer: BC Managed Care – PPO

## 2013-05-21 ENCOUNTER — Observation Stay (HOSPITAL_COMMUNITY)
Admission: RE | Admit: 2013-05-21 | Discharge: 2013-05-22 | Disposition: A | Discharge status: Home / Self Care | Payer: BC Managed Care – PPO | Source: Ambulatory Visit | Attending: Orthopedic Surgery | Admitting: Orthopedic Surgery

## 2013-05-21 ENCOUNTER — Encounter (HOSPITAL_COMMUNITY): Payer: Self-pay | Admitting: *Deleted

## 2013-05-21 ENCOUNTER — Encounter (HOSPITAL_COMMUNITY): Payer: BC Managed Care – PPO | Admitting: Anesthesiology

## 2013-05-21 ENCOUNTER — Ambulatory Visit (HOSPITAL_COMMUNITY): Payer: BC Managed Care – PPO | Admitting: Anesthesiology

## 2013-05-21 ENCOUNTER — Encounter (HOSPITAL_COMMUNITY)
Admission: RE | Disposition: A | Discharge status: Home / Self Care | Payer: Self-pay | Source: Ambulatory Visit | Attending: Orthopedic Surgery

## 2013-05-21 DIAGNOSIS — M5126 Other intervertebral disc displacement, lumbar region: Secondary | ICD-10-CM | POA: Diagnosis present

## 2013-05-21 DIAGNOSIS — M4180 Other forms of scoliosis, site unspecified: Secondary | ICD-10-CM | POA: Insufficient documentation

## 2013-05-21 DIAGNOSIS — I10 Essential (primary) hypertension: Secondary | ICD-10-CM | POA: Insufficient documentation

## 2013-05-21 DIAGNOSIS — R220 Localized swelling, mass and lump, head: Secondary | ICD-10-CM | POA: Insufficient documentation

## 2013-05-21 DIAGNOSIS — Z85828 Personal history of other malignant neoplasm of skin: Secondary | ICD-10-CM | POA: Insufficient documentation

## 2013-05-21 DIAGNOSIS — M79609 Pain in unspecified limb: Secondary | ICD-10-CM | POA: Insufficient documentation

## 2013-05-21 DIAGNOSIS — M48062 Spinal stenosis, lumbar region with neurogenic claudication: Secondary | ICD-10-CM | POA: Diagnosis present

## 2013-05-21 DIAGNOSIS — R29898 Other symptoms and signs involving the musculoskeletal system: Secondary | ICD-10-CM | POA: Insufficient documentation

## 2013-05-21 DIAGNOSIS — M62838 Other muscle spasm: Secondary | ICD-10-CM | POA: Insufficient documentation

## 2013-05-21 DIAGNOSIS — M48061 Spinal stenosis, lumbar region without neurogenic claudication: Principal | ICD-10-CM | POA: Insufficient documentation

## 2013-05-21 HISTORY — PX: LUMBAR LAMINECTOMY/DECOMPRESSION MICRODISCECTOMY: SHX5026

## 2013-05-21 SURGERY — LUMBAR LAMINECTOMY/DECOMPRESSION MICRODISCECTOMY
Anesthesia: General | Site: Back | Laterality: Left | Wound class: Clean

## 2013-05-21 SURGERY — LUMBAR LAMINECTOMY/DECOMPRESSION MICRODISCECTOMY
Anesthesia: General | Site: Back | Laterality: Left

## 2013-05-21 MED ORDER — HYDROMORPHONE HCL 2 MG PO TABS: 2.0000 mg | ORAL_TABLET | ORAL | Status: DC | PRN

## 2013-05-21 MED ORDER — HYDROMORPHONE HCL 2 MG PO TABS
2.0000 mg | ORAL_TABLET | ORAL | Status: DC | PRN
  Administered 2013-05-22 (×3): 2 mg via ORAL
  Filled 2013-05-21 (×3): qty 1

## 2013-05-21 MED ORDER — COLCHICINE 0.6 MG PO TABS
0.6000 mg | ORAL_TABLET | Freq: Two times a day (BID) | ORAL | Status: DC | PRN
  Filled 2013-05-21: qty 1

## 2013-05-21 MED ORDER — BACITRACIN-NEOMYCIN-POLYMYXIN 400-5-5000 EX OINT
TOPICAL_OINTMENT | CUTANEOUS | Status: AC
  Filled 2013-05-21: qty 1

## 2013-05-21 MED ORDER — POLYETHYLENE GLYCOL 3350 17 G PO PACK: 17.0000 g | PACK | Freq: Every day | ORAL | Status: DC | PRN

## 2013-05-21 MED ORDER — HYDROMORPHONE HCL PF 1 MG/ML IJ SOLN: 0.2500 mg | INTRAMUSCULAR | Status: DC | PRN

## 2013-05-21 MED ORDER — BACITRACIN-NEOMYCIN-POLYMYXIN 400-5-5000 EX OINT
TOPICAL_OINTMENT | CUTANEOUS | Status: DC | PRN
  Administered 2013-05-21: 1 via TOPICAL

## 2013-05-21 MED ORDER — CEFAZOLIN SODIUM-DEXTROSE 2-3 GM-% IV SOLR
INTRAVENOUS | Status: AC
  Filled 2013-05-21: qty 50

## 2013-05-21 MED ORDER — BUPIVACAINE LIPOSOME 1.3 % IJ SUSP
20.0000 mL | Freq: Once | INTRAMUSCULAR | Status: DC
  Filled 2013-05-21: qty 20

## 2013-05-21 MED ORDER — HYDROMORPHONE HCL PF 1 MG/ML IJ SOLN: 0.5000 mg | INTRAMUSCULAR | Status: DC | PRN

## 2013-05-21 MED ORDER — ONDANSETRON HCL 4 MG/2ML IJ SOLN: 4.0000 mg | INTRAMUSCULAR | Status: DC | PRN

## 2013-05-21 MED ORDER — DEXAMETHASONE SODIUM PHOSPHATE 10 MG/ML IJ SOLN
INTRAMUSCULAR | Status: DC | PRN
  Administered 2013-05-21: 10 mg via INTRAVENOUS

## 2013-05-21 MED ORDER — BISACODYL 10 MG RE SUPP: 10.0000 mg | Freq: Every day | RECTAL | Status: DC | PRN

## 2013-05-21 MED ORDER — MENTHOL 3 MG MT LOZG: 1.0000 | LOZENGE | OROMUCOSAL | Status: DC | PRN

## 2013-05-21 MED ORDER — GLYCOPYRROLATE 0.2 MG/ML IJ SOLN
INTRAMUSCULAR | Status: DC | PRN
  Administered 2013-05-21: .6 mg via INTRAVENOUS

## 2013-05-21 MED ORDER — FENTANYL CITRATE 0.05 MG/ML IJ SOLN
INTRAMUSCULAR | Status: DC | PRN
  Administered 2013-05-21 (×2): 25 ug via INTRAVENOUS
  Administered 2013-05-21: 100 ug via INTRAVENOUS
  Administered 2013-05-21 (×2): 25 ug via INTRAVENOUS
  Administered 2013-05-21: 50 ug via INTRAVENOUS

## 2013-05-21 MED ORDER — THROMBIN 5000 UNITS EX SOLR
CUTANEOUS | Status: AC
  Filled 2013-05-21: qty 10000

## 2013-05-21 MED ORDER — METHOCARBAMOL 100 MG/ML IJ SOLN
500.0000 mg | Freq: Four times a day (QID) | INTRAVENOUS | Status: DC | PRN
  Filled 2013-05-21: qty 5

## 2013-05-21 MED ORDER — BUPIVACAINE LIPOSOME 1.3 % IJ SUSP
INTRAMUSCULAR | Status: DC | PRN
  Administered 2013-05-21: 20 mL

## 2013-05-21 MED ORDER — SODIUM CHLORIDE 0.9 % IV SOLN
INTRAVENOUS | Status: DC | PRN
  Administered 2013-05-21: 14:00:00 via INTRAVENOUS

## 2013-05-21 MED ORDER — PHENOL 1.4 % MT LIQD: 1.0000 | OROMUCOSAL | Status: DC | PRN

## 2013-05-21 MED ORDER — METHOCARBAMOL 500 MG PO TABS: 500.0000 mg | ORAL_TABLET | Freq: Four times a day (QID) | ORAL | Status: DC | PRN

## 2013-05-21 MED ORDER — SUCCINYLCHOLINE CHLORIDE 20 MG/ML IJ SOLN
INTRAMUSCULAR | Status: DC | PRN
  Administered 2013-05-21: 100 mg via INTRAVENOUS

## 2013-05-21 MED ORDER — LACTATED RINGERS IV SOLN
INTRAVENOUS | Status: DC
  Administered 2013-05-21: 1000 mL via INTRAVENOUS

## 2013-05-21 MED ORDER — CEFAZOLIN SODIUM 1-5 GM-% IV SOLN
1.0000 g | Freq: Three times a day (TID) | INTRAVENOUS | Status: AC
  Administered 2013-05-21 – 2013-05-22 (×3): 1 g via INTRAVENOUS
  Filled 2013-05-21 (×3): qty 50

## 2013-05-21 MED ORDER — CISATRACURIUM BESYLATE (PF) 10 MG/5ML IV SOLN
INTRAVENOUS | Status: DC | PRN
  Administered 2013-05-21: 4 mg via INTRAVENOUS
  Administered 2013-05-21 (×2): 2 mg via INTRAVENOUS

## 2013-05-21 MED ORDER — NEOSTIGMINE METHYLSULFATE 1 MG/ML IJ SOLN
INTRAMUSCULAR | Status: DC | PRN
  Administered 2013-05-21: 4 mg via INTRAVENOUS

## 2013-05-21 MED ORDER — LACTATED RINGERS IV SOLN: INTRAVENOUS | Status: DC

## 2013-05-21 MED ORDER — FEBUXOSTAT 40 MG PO TABS
40.0000 mg | ORAL_TABLET | Freq: Every morning | ORAL | Status: DC
  Administered 2013-05-22: 40 mg via ORAL
  Filled 2013-05-21: qty 1

## 2013-05-21 MED ORDER — PROMETHAZINE HCL 25 MG/ML IJ SOLN: 6.2500 mg | INTRAMUSCULAR | Status: DC | PRN

## 2013-05-21 MED ORDER — ONDANSETRON HCL 4 MG/2ML IJ SOLN
INTRAMUSCULAR | Status: DC | PRN
  Administered 2013-05-21: 4 mg via INTRAMUSCULAR

## 2013-05-21 MED ORDER — METHOCARBAMOL 500 MG PO TABS
500.0000 mg | ORAL_TABLET | Freq: Four times a day (QID) | ORAL | Status: DC | PRN
  Administered 2013-05-21 – 2013-05-22 (×3): 500 mg via ORAL
  Filled 2013-05-21 (×3): qty 1

## 2013-05-21 MED ORDER — MIDAZOLAM HCL 5 MG/5ML IJ SOLN
INTRAMUSCULAR | Status: DC | PRN
  Administered 2013-05-21: 2 mg via INTRAVENOUS

## 2013-05-21 MED ORDER — PROPOFOL 10 MG/ML IV BOLUS
INTRAVENOUS | Status: DC | PRN
  Administered 2013-05-21: 150 mg via INTRAVENOUS

## 2013-05-21 MED ORDER — ACETAMINOPHEN 325 MG PO TABS: 650.0000 mg | ORAL_TABLET | ORAL | Status: DC | PRN

## 2013-05-21 MED ORDER — THROMBIN 5000 UNITS EX SOLR
CUTANEOUS | Status: DC | PRN
  Administered 2013-05-21: 5000 [IU] via TOPICAL

## 2013-05-21 MED ORDER — ACETAMINOPHEN 650 MG RE SUPP: 650.0000 mg | RECTAL | Status: DC | PRN

## 2013-05-21 MED ORDER — FLEET ENEMA 7-19 GM/118ML RE ENEM: 1.0000 | ENEMA | Freq: Once | RECTAL | Status: AC | PRN

## 2013-05-21 MED ORDER — IRBESARTAN 75 MG PO TABS
75.0000 mg | ORAL_TABLET | Freq: Every day | ORAL | Status: DC
  Administered 2013-05-21 – 2013-05-22 (×2): 75 mg via ORAL
  Filled 2013-05-21 (×2): qty 1

## 2013-05-21 MED ORDER — BUPIVACAINE-EPINEPHRINE 0.5% -1:200000 IJ SOLN
INTRAMUSCULAR | Status: AC
  Filled 2013-05-21: qty 1

## 2013-05-21 MED ORDER — SODIUM CHLORIDE 0.9 % IR SOLN
Status: DC | PRN
  Administered 2013-05-21: 15:00:00

## 2013-05-21 MED ORDER — CEFAZOLIN SODIUM-DEXTROSE 2-3 GM-% IV SOLR
2.0000 g | INTRAVENOUS | Status: AC
  Administered 2013-05-21: 2 g via INTRAVENOUS

## 2013-05-21 SURGICAL SUPPLY — 44 items
BAG ZIPLOCK 12X15 (MISCELLANEOUS) ×2 IMPLANT
BENZOIN TINCTURE PRP APPL 2/3 (GAUZE/BANDAGES/DRESSINGS) ×2 IMPLANT
CLEANER TIP ELECTROSURG 2X2 (MISCELLANEOUS) ×2 IMPLANT
CLOTH BEACON ORANGE TIMEOUT ST (SAFETY) ×2 IMPLANT
CONT SPECI 4OZ STER CLIK (MISCELLANEOUS) ×2 IMPLANT
DRAIN PENROSE 18X1/4 LTX STRL (WOUND CARE) IMPLANT
DRAPE MICROSCOPE LEICA (MISCELLANEOUS) ×2 IMPLANT
DRAPE POUCH INSTRU U-SHP 10X18 (DRAPES) ×2 IMPLANT
DRAPE SURG 17X11 SM STRL (DRAPES) ×2 IMPLANT
DRSG ADAPTIC 3X8 NADH LF (GAUZE/BANDAGES/DRESSINGS) ×2 IMPLANT
DRSG EMULSION OIL 3X3 NADH (GAUZE/BANDAGES/DRESSINGS) ×2 IMPLANT
DRSG PAD ABDOMINAL 8X10 ST (GAUZE/BANDAGES/DRESSINGS) ×2 IMPLANT
DURAPREP 26ML APPLICATOR (WOUND CARE) ×2 IMPLANT
ELECT BLADE TIP CTD 4 INCH (ELECTRODE) ×2 IMPLANT
ELECT REM PT RETURN 9FT ADLT (ELECTROSURGICAL) ×2
ELECTRODE REM PT RTRN 9FT ADLT (ELECTROSURGICAL) ×1 IMPLANT
GLOVE BIOGEL PI IND STRL 8 (GLOVE) ×2 IMPLANT
GLOVE BIOGEL PI INDICATOR 8 (GLOVE) ×2
GLOVE ECLIPSE 8.0 STRL XLNG CF (GLOVE) ×4 IMPLANT
GOWN PREVENTION PLUS LG XLONG (DISPOSABLE) ×6 IMPLANT
GOWN STRL REIN XL XLG (GOWN DISPOSABLE) ×4 IMPLANT
KIT BASIN OR (CUSTOM PROCEDURE TRAY) ×2 IMPLANT
KIT POSITIONING SURG ANDREWS (MISCELLANEOUS) ×2 IMPLANT
MANIFOLD NEPTUNE II (INSTRUMENTS) ×2 IMPLANT
NEEDLE SPNL 18GX3.5 QUINCKE PK (NEEDLE) ×4 IMPLANT
NS IRRIG 1000ML POUR BTL (IV SOLUTION) ×2 IMPLANT
PATTIES SURGICAL .5 X.5 (GAUZE/BANDAGES/DRESSINGS) IMPLANT
PATTIES SURGICAL .75X.75 (GAUZE/BANDAGES/DRESSINGS) IMPLANT
PATTIES SURGICAL 1X1 (DISPOSABLE) IMPLANT
PIN SAFETY NICK PLATE  2 MED (MISCELLANEOUS)
PIN SAFETY NICK PLATE 2 MED (MISCELLANEOUS) IMPLANT
POSITIONER SURGICAL ARM (MISCELLANEOUS) ×2 IMPLANT
SPONGE GAUZE 4X4 12PLY (GAUZE/BANDAGES/DRESSINGS) ×2 IMPLANT
SPONGE LAP 4X18 X RAY DECT (DISPOSABLE) ×4 IMPLANT
SPONGE SURGIFOAM ABS GEL 100 (HEMOSTASIS) ×2 IMPLANT
STAPLER VISISTAT 35W (STAPLE) IMPLANT
SUT VIC AB 0 CT1 27 (SUTURE) ×1
SUT VIC AB 0 CT1 27XBRD ANTBC (SUTURE) ×1 IMPLANT
SUT VIC AB 1 CT1 27 (SUTURE) ×4
SUT VIC AB 1 CT1 27XBRD ANTBC (SUTURE) ×4 IMPLANT
TAPE CLOTH SURG 4X10 WHT LF (GAUZE/BANDAGES/DRESSINGS) ×2 IMPLANT
TOWEL OR 17X26 10 PK STRL BLUE (TOWEL DISPOSABLE) ×4 IMPLANT
TRAY LAMINECTOMY (CUSTOM PROCEDURE TRAY) ×2 IMPLANT
WATER STERILE IRR 1500ML POUR (IV SOLUTION) ×2 IMPLANT

## 2013-05-21 NOTE — Preoperative (Signed)
Beta Blockers   Reason not to administer Beta Blockers:Not Applicable 

## 2013-05-21 NOTE — Brief Op Note (Signed)
05/21/2013  4:11 PM  PATIENT:  Elaine Jenkins  52 y.o. female  PRE-OPERATIVE DIAGNOSIS:  HERNIATED DISC and Spinal Stenosis  POST-OPERATIVE DIAGNOSIS:  herniated disc L5-S1 and Spinal Stenosis  PROCEDURE:  Procedure(s): LUMBAR LAMINECTOMY MICRODISCECTOMY L5-S1 LEFT  (Left) and Foraminotomies for TWO NERVE ROOTS.L-5 and S-1 on the Left and Central Decompression For Severe Stenosis.  SURGEON:  Surgeon(s) and Role:    * Jacki Cones, MD - Primary    * Drucilla Schmidt, MD - Assisting     ASSISTANTS: Marlowe Kays MD  ANESTHESIA:   spinal  EBL:  Total I/O In: -  Out: 100 [Blood:100]  BLOOD ADMINISTERED:none  DRAINS: none   LOCAL MEDICATIONS USED:  MARCAINE 20cc of 0.25% with Epinephrine and 20cc of Exparel.     SPECIMEN:  Source of Specimen:  L-5-S-1  DISPOSITION OF SPECIMEN:  PATHOLOGY  COUNTS:  YES  TOURNIQUET:  * No tourniquets in log *  DICTATION: .Other Dictation: Dictation Number [id number]  PLAN OF CARE: Admit for overnight observation  PATIENT DISPOSITION:  PACU - hemodynamically stable.   Delay start of Pharmacological VTE agent (>24hrs) due to surgical blood loss or risk of bleeding: yes

## 2013-05-21 NOTE — Progress Notes (Signed)
Dr. Okey Dupre in to check pt, right inner lip swollen - pt possibly bit lip; also listened to lungs; lungs clear; encouraged pt to keep lips moist, ice to lip as needed, and use incentive spirometry to encourage coughing and deep breathing

## 2013-05-21 NOTE — Interval H&P Note (Signed)
History and Physical Interval Note:  05/21/2013 1:59 PM  Elaine Jenkins  has presented today for surgery, with the diagnosis of HERNIATED DISC   The various methods of treatment have been discussed with the patient and family. After consideration of risks, benefits and other options for treatment, the patient has consented to  Procedure(s): LUMBAR LAMINECTOMY MICRODISCECTOMY L5-S1 LEFT  (Left) as a surgical intervention .  The patient's history has been reviewed, patient examined, no change in status, stable for surgery.  I have reviewed the patient's chart and labs.  Questions were answered to the patient's satisfaction.     Mehgan Santmyer A

## 2013-05-21 NOTE — Transfer of Care (Signed)
Immediate Anesthesia Transfer of Care Note  Patient: Elaine Jenkins  Procedure(s) Performed: Procedure(s): LUMBAR LAMINECTOMY MICRODISCECTOMY L5-S1 LEFT  (Left)  Patient Location: PACU  Anesthesia Type:General  Level of Consciousness: awake, alert , oriented and patient cooperative  Airway & Oxygen Therapy: Patient Spontanous Breathing and Patient connected to face mask oxygen  Post-op Assessment: Report given to PACU RN and Post -op Vital signs reviewed and stable  Post vital signs: Reviewed and stable  Complications: No apparent anesthesia complications

## 2013-05-21 NOTE — Anesthesia Postprocedure Evaluation (Signed)
  Anesthesia Post-op Note  Patient: Elaine Jenkins  Procedure(s) Performed: Procedure(s) (LRB): LUMBAR LAMINECTOMY MICRODISCECTOMY L5-S1 LEFT  (Left)  Patient Location: PACU  Anesthesia Type: General  Level of Consciousness: awake and alert   Airway and Oxygen Therapy: Patient Spontanous Breathing  Post-op Pain: mild  Post-op Assessment: Post-op Vital signs reviewed, Patient's Cardiovascular Status Stable, Respiratory Function Stable, Patent Airway and No signs of Nausea or vomiting  Last Vitals:  Filed Vitals:   05/21/13 1630  BP: 149/68  Pulse: 71  Temp:   Resp: 20    Post-op Vital Signs: stable   Complications: No apparent anesthesia complications

## 2013-05-21 NOTE — Plan of Care (Signed)
Problem: Consults Goal: Diagnosis - Spinal Surgery Dec laminectomy l5 s1

## 2013-05-21 NOTE — Anesthesia Preprocedure Evaluation (Signed)
Anesthesia Evaluation  Patient identified by MRN, date of birth, ID band Patient awake    Reviewed: Allergy & Precautions, H&P , NPO status , Patient's Chart, lab work & pertinent test results  Airway Mallampati: II TM Distance: <3 FB Neck ROM: Full    Dental no notable dental hx.    Pulmonary neg pulmonary ROS,  breath sounds clear to auscultation  Pulmonary exam normal       Cardiovascular hypertension, Pt. on medications Rhythm:Regular Rate:Normal     Neuro/Psych negative neurological ROS  negative psych ROS   GI/Hepatic negative GI ROS, Neg liver ROS,   Endo/Other  negative endocrine ROS  Renal/GU Chronic renal insufficiency, stage I  negative genitourinary   Musculoskeletal negative musculoskeletal ROS (+)   Abdominal   Peds negative pediatric ROS (+)  Hematology negative hematology ROS (+)   Anesthesia Other Findings   Reproductive/Obstetrics negative OB ROS                           Anesthesia Physical Anesthesia Plan  ASA: II  Anesthesia Plan: General   Post-op Pain Management:    Induction: Intravenous  Airway Management Planned: Oral ETT  Additional Equipment:   Intra-op Plan:   Post-operative Plan: Extubation in OR  Informed Consent: I have reviewed the patients History and Physical, chart, labs and discussed the procedure including the risks, benefits and alternatives for the proposed anesthesia with the patient or authorized representative who has indicated his/her understanding and acceptance.   Dental advisory given  Plan Discussed with: CRNA and Surgeon  Anesthesia Plan Comments:         Anesthesia Quick Evaluation

## 2013-05-22 MED ORDER — METHOCARBAMOL 500 MG PO TABS: 500.0000 mg | ORAL_TABLET | Freq: Four times a day (QID) | ORAL | Status: DC | PRN

## 2013-05-22 MED ORDER — HYDROMORPHONE HCL 2 MG PO TABS: 2.0000 mg | ORAL_TABLET | ORAL | Status: DC | PRN

## 2013-05-22 NOTE — Progress Notes (Signed)
Pt stable, scripts, and equipment given with no questions/concerns voiced by pt or husband.  Pt transported via wheelchair to private vehicle by NT and family.

## 2013-05-22 NOTE — Progress Notes (Signed)
   Subjective: 1 Day Post-Op Procedure(s) (LRB): LUMBAR LAMINECTOMY MICRODISCECTOMY L5-S1 LEFT  (Left)   Patient reports pain as mild, pain well controlled. No events throughout the night. Ready to go home if does wellw with PT and pain stays well controlled.   Objective:   VITALS:   Filed Vitals:   05/22/13 0219  BP: 117/70  Pulse: 86  Temp: 97.9 F (36.6 C)  Resp: 16    Dorsiflexion/Plantar flexion intact Incision: dressing C/D/I No cellulitis present Compartment soft  LABS  Recent Labs  05/20/13 1400  HGB 12.7  HCT 38.4  WBC 11.2*  PLT 299     Recent Labs  05/20/13 1300  NA 136  K 3.6  BUN 35*  CREATININE 1.17*  GLUCOSE 91     Assessment/Plan: 1 Day Post-Op Procedure(s) (LRB): LUMBAR LAMINECTOMY MICRODISCECTOMY L5-S1 LEFT  (Left) Advance diet Up with therapy D/C IV fluids Discharge home with home health Follow up in 2 weeks at [facility]. Follow up with  Dr Darrelyn Hillock in 2 weeks.  Contact information:  [facility] [address], [address] [place] [address] [phone]        Anastasio Auerbach. Ita Fritzsche   PAC  05/22/2013, 7:45 AM

## 2013-05-22 NOTE — Op Note (Signed)
Elaine Jenkins, Elaine Jenkins NO.:  [id number]  MEDICAL RECORD NO.:  [id number]  LOCATION:  1608                         FACILITY:  [facility]  PHYSICIAN:  Georges Lynch. Kajah Santizo, M.D.DATE OF BIRTH:  [birth year]  DATE OF PROCEDURE:  05/21/2013 DATE OF DISCHARGE:                              OPERATIVE REPORT   SURGEON:  Georges Lynch. Darrelyn Hillock, M.D.  ASSISTANT:  Marlowe Kays, M.D.  PREOPERATIVE DIAGNOSES: 1. Severe spinal stenosis secondary to a scoliosis at L5-S1. 2. Large herniated lumbar disk at L5-S1 on the left with severe pain     in her left leg only. 3. Previous disk surgery at L4-5 on the right by another surgeon years     ago.  POSTOPERATIVE DIAGNOSES: 1. Severe spinal stenosis secondary to a scoliosis at L5-S1. 2. Large herniated lumbar disk at L5-S1 on the left with severe pain     in her left leg only. 3. Previous disk surgery at L4-5 on the right by another surgeon years     ago.  OPERATION: 1. Central decompressive lumbar laminectomy at L5-S1 for spinal     stenosis. 2. Foraminotomy for the L5 root on the left. 3. Foraminotomy for the S1 root on the left. 4. Microdiskectomy for an extremely large herniated lumbar disk at L5-     S1 on the left.  DESCRIPTION OF PROCEDURE:  Under general anesthesia, routine orthopedic prep and draping was carried out with the patient on the spinal frame. She had 2 g of IV Ancef.  The appropriate time-out was first carried out.  Prior to that I marked the appropriate left side of her back in the holding area.  Two needles were placed in the back for localization purposes, x-ray was taken.  Incision then was made over the L5-S1 interspace.  The incision was extended proximally and distally.  At this time, the incision was carried down through the lumbodorsal fascia.  The muscle was separated from the lamina and spinous process bilaterally. Self-retaining retractors were inserted after we took a second x-ray for localization  purposes.  At this time, we identified the L5 spinous process.  The McCullough retractors were inserted.  This area was severely collapsed, so I elected to go central.  I did a central decompressive lumbar laminectomy and brought the microscope in and identified the ligamentum flavum.  We gradually removed the ligamentum flavum.  We protected the dura at all times with cottonoids when the microscope was used.  Because of the large herniation and a severe stenosis, we went out far laterally.  First we went proximally and distally.  For laminectomy we did a foraminotomy for the L5 root and along the S1 root because of the extreme tightness.  After this was done, we identified the S1 root, gently retracted it and another x-ray was taken.  A final x-ray was later taken with an instrument in the disk space.  At this time, we easily identified the S1 root, it was quite swollen, and we gently retracted with a D'errico retractor, cauterized lateral recess veins with a bipolar.  We identified a herniated disk right and made a cruciate incision and then went subligamentous and dissected large  amounts of disk material.  Then, we went down into the space as well did a complete diskectomy.  We utilized the nerve hook and the Epstein curettes to go medially and laterally and distally and proximally.  We also made sure the foramen was open and the nerve root now after the diskectomy was easily movable so was the dura easily movable.  We were able to easily pass a hockey-stick out of the foramen for the L5 root and also for the S1 root on the left.  We thoroughly irrigated out the area.  We were happy with the decompression.  I loosely applied some thrombin-soaked Gelfoam and closed the wound in layers in usual fashion.  I left a small distal deep and proximal part of the wound open for draining purposes.  The subcu was closed with 0 Vicryl.  Prior to closing the subcu, I injected 20 mL of Exparel.   Note, at the beginning of the procedure, I injected 20 mL of 0.25% Marcaine with epinephrine into the subcu to control bleeding.  The remaining part of the wound was closed with staples and a sterile dressing was applied. The patient left the operating room in satisfactory condition.  She had 2 g of IV Ancef preop.          ______________________________ Georges Lynch. Darrelyn Hillock, M.D.     RAG/MEDQ  D:  05/21/2013  T:  05/22/2013  Job:  [id number]

## 2013-05-22 NOTE — Progress Notes (Signed)
Clinical Social Work Department BRIEF PSYCHOSOCIAL ASSESSMENT 05/22/2013  Patient:  Elaine Jenkins     Account Number:  [id number]     Admit date:  05/21/2013  Clinical Social Worker:  Leron Croak, CLINICAL SOCIAL WORKER  Date/Time:  05/22/2013 03:27 PM  Referred by:  Physician  Date Referred:  05/22/2013 Referred for  SNF Placement   Other Referral:   Interview type:  Patient Other interview type:   Husband and son were also present at the bedside    PSYCHOSOCIAL DATA Living Status:  FAMILY Admitted from facility:   Level of care:   Primary support name:  Kristyna Bradstreet  [phone] Primary support relationship to patient:  SPOUSE Degree of support available:   Pt has a good support system    CURRENT CONCERNS Current Concerns  Post-Acute Placement   Other Concerns:    SOCIAL WORK ASSESSMENT / PLAN CSW met with the Pt and family at the bedside. Pt was not aware of the consult and stated that she is not wanting SNF placement at this time. Pt stated that she will be leaving in a few hours and will go home.    CSW thanked Pt and family for their time and no furhter needs are noted at this time.   Assessment/plan status:  Information/Referral to [facility] Other assessment/ plan:   Information/referral to community resources:   No information handouts are needed at this time    PATIENT'S/FAMILY'S RESPONSE TO PLAN OF CARE: Pt and family are appreciative for visit.       Leron Croak, LCSWA [facility] Emergency Dept.  [phone]

## 2013-05-22 NOTE — Plan of Care (Signed)
Problem: Consults Goal: Diagnosis - Spinal Surgery Outcome: Completed/Met Date Met:  05/22/13 Lumbar Laminectomy (Complex) L5-S1

## 2013-05-22 NOTE — Evaluation (Addendum)
Occupational Therapy Evaluation and Discharge Patient Details Name: Elaine Jenkins MRN: [id number] DOB: May 11, 1961 Today's Date: 05/22/2013 Time: 4098-1191 OT Time Calculation (min): 45 min  OT Assessment / Plan / Recommendation History of present illness LUMBAR LAMINECTOMY MICRODISCECTOMY L5-S1 LEFT  (Left) and Foraminotomies for TWO NERVE ROOTS.L-5 and S-1 on the Left and Central Decompression For Severe Stenosis   Clinical Impression   This 52 yo female admitted and underwent above presents to acute OT with all education completed. Will D/C from acute OT.    OT Assessment  Patient does not need any further OT services    Follow Up Recommendations  No OT follow up       Equipment Recommendations  3 in 1 bedside comode          Precautions / Restrictions Precautions Precautions: Back Precaution Booklet Issued: Yes (comment) Restrictions Weight Bearing Restrictions: No   Pertinent Vitals/Pain 4/10 back; pre-medicated    ADL  Eating/Feeding: Independent Where Assessed - Eating/Feeding: Chair Grooming: Set up Where Assessed - Grooming: Supported sitting Upper Body Bathing: Set up Where Assessed - Upper Body Bathing: Supported sitting Lower Body Bathing: Set up (crossing one leg over the other while seated) Where Assessed - Lower Body Bathing: Unsupported sit to stand Upper Body Dressing: Set up Where Assessed - Upper Body Dressing: Supported sitting Lower Body Dressing: Set up (crossing one leg over the other while seated & AE) Where Assessed - Lower Body Dressing: Unsupported sit to stand Toilet Transfer: Supervision/safety Toilet Transfer Method: Sit to Barista: Raised toilet seat with arms (or 3-in-1 over toilet) Toileting - Clothing Manipulation and Hygiene: Supervision/safety Where Assessed - Toileting Clothing Manipulation and Hygiene: Sit to stand from 3-in-1 or toilet Equipment Used: Reacher;Gait belt Transfers/Ambulation Related to  ADLs: S for all (pushing IV pole) ADL Comments: Pt has reacher at home and a plastic chair she can put in her shower stall if needed. Discussed using a cup to spit in for brushing teeth. Discussed using Saran Wrap "Press ann Seal" for showering wrapped around her where incision is and then taping at the top     Acute Rehab OT Goals Patient Stated Goal: Home today OT Goal Formulation: With patient  Visit Information  Last OT Received On: 05/22/13 Assistance Needed: +1 History of Present Illness: LUMBAR LAMINECTOMY MICRODISCECTOMY L5-S1 LEFT  (Left) and Foraminotomies for TWO NERVE ROOTS.L-5 and S-1 on the Left and Central Decompression For Severe Stenosis       Prior Functioning     Home Living Family/patient expects to be discharged to:: Private residence Living Arrangements: Spouse/significant other Available Help at Discharge: Family;Available PRN/intermittently Type of Home: House Home Access: Stairs to enter [employer] of Steps: 2 Entrance Stairs-Rails: None Home Layout: Two level Alternate Level Stairs-Number of Steps: 16 Alternate Level Stairs-Rails: Left;Right Home Equipment: None Prior Function Level of Independence: Independent Communication Communication: No difficulties Dominant Hand: Right         Vision/Perception Vision - History Patient Visual Report: No change from baseline   Cognition  Cognition Arousal/Alertness: Awake/alert Behavior During Therapy: WFL for tasks assessed/performed Overall Cognitive Status: Within Functional Limits for tasks assessed    Extremity/Trunk Assessment Upper Extremity Assessment Upper Extremity Assessment: Overall WFL for tasks assessed     Mobility Bed Mobility Bed Mobility: Left Sidelying to Sit;Rolling Left;Sitting - Scoot to Edge of Bed Rolling Left: 4: Min assist;With rail Left Sidelying to Sit: 3: Mod assist;With rails;HOB flat Sitting - Scoot to Edge of Bed:  7: Independent Transfers Transfers:  Sit to Stand;Stand to Sit Sit to Stand: 5: Supervision;With upper extremity assist;From bed Stand to Sit: 5: Supervision;With upper extremity assist;With armrests;To chair/3-in-1           End of Session OT - End of Session Activity Tolerance: Patient tolerated treatment well Patient left: in chair;with call bell/phone within reach       Evette Georges [phone] 05/22/2013, 10:28 AM

## 2013-05-22 NOTE — Evaluation (Signed)
Physical Therapy Evaluation Patient Details Name: Elaine Jenkins MRN: [id number] DOB: [birth year] Today's Date: 05/22/2013 Time: 0865-7846 PT Time Calculation (min): 29 min  PT Assessment / Plan / Recommendation History of Present Illness  LUMBAR LAMINECTOMY MICRODISCECTOMY L5-S1 LEFT  (Left) and Foraminotomies for TWO NERVE ROOTS.L-5 and S-1 on the Left and Central Decompression For Severe Stenosis  Clinical Impression  Pt doing well, all questions answered; verbally reviewed/simulated care transfers as well    PT Assessment  Patent does not need any further PT services    Follow Up Recommendations  No PT follow up    Does the patient have the potential to tolerate intense rehabilitation      Barriers to Discharge        Equipment Recommendations  Rolling walker with 5" wheels;3in1 (PT)    Recommendations for Other Services     Frequency      Precautions / Restrictions Precautions Precautions: Back Precaution Booklet Issued: Yes (comment) Restrictions Weight Bearing Restrictions: No   Pertinent Vitals/Pain Pain fairly well controlled      Mobility  Bed Mobility Bed Mobility: Left Sidelying to Sit Rolling Left: 4: Min assist;With rail Left Sidelying to Sit: 4: Min assist;4: Min guard Sitting - Scoot to [place] of Bed: 7: Independent Details for Bed Mobility Assistance: verbal cues for back precaution and technique Transfers Transfers: Sit to Stand;Stand to Sit Sit to Stand: 4: Min guard;5: Supervision;From chair/3-in-1 Stand to Sit: 4: Min guard;5: Supervision;To chair/3-in-1;To bed Details for Transfer Assistance: verbal cues for back posture and hand placement  Ambulation/Gait Ambulation/Gait Assistance: 4: Min guard;5: Supervision Ambulation Distance (Feet): 260 Feet Assistive device: Rolling walker Ambulation/Gait Assistance Details: verbal cues for RW safety  Gait Pattern: Step-through pattern Stairs: Yes Stairs Assistance: 4: Min guard Stair Management  Technique: No rails;Two rails;Step to pattern;Forwards Number of Stairs: 10    Exercises     PT Diagnosis:    PT Problem List:   PT Treatment Interventions:       PT Goals(Current goals can be found in the care plan section) Acute Rehab PT Goals Patient Stated Goal: Home today  Visit Information  Last PT Received On: 05/22/13 Assistance Needed: +1 History of Present Illness: LUMBAR LAMINECTOMY MICRODISCECTOMY L5-S1 LEFT  (Left) and Foraminotomies for TWO NERVE ROOTS.L-5 and S-1 on the Left and Central Decompression For Severe Stenosis       Prior Functioning  Home Living Family/patient expects to be discharged to:: Private residence Living Arrangements: Spouse/significant other Available Help at Discharge: Family;Available PRN/intermittently Type of Home: House Home Access: Stairs to enter [employer] of Steps: 2 Entrance Stairs-Rails: None Home Layout: Two level Alternate Level Stairs-Number of Steps: 16 Alternate Level Stairs-Rails: Left;Right Home Equipment: None Prior Function Level of Independence: Independent Communication Communication: No difficulties Dominant Hand: Right    Cognition  Cognition Arousal/Alertness: Awake/alert Behavior During Therapy: WFL for tasks assessed/performed Overall Cognitive Status: Within Functional Limits for tasks assessed    Extremity/Trunk Assessment Upper Extremity Assessment Upper Extremity Assessment: Defer to OT evaluation Lower Extremity Assessment Lower Extremity Assessment: LLE deficits/detail LLE Deficits / Details: pt reports weakness and numbness along L lateral thigh and foot but better than before surgery   Balance    End of Session PT - End of Session Activity Tolerance: Patient tolerated treatment well Patient left: with call bell/phone within reach;with family/visitor present Nurse Communication: Mobility status  GP     [facility] 05/22/2013, 1:32 PM

## 2013-05-24 ENCOUNTER — Encounter (HOSPITAL_COMMUNITY): Payer: Self-pay | Admitting: Orthopedic Surgery

## 2013-05-26 NOTE — Discharge Summary (Signed)
Physician Discharge Summary   Patient ID: Elaine Jenkins MRN: [id number] DOB/AGE: Jan 20, 1961 52 y.o.  Admit date: 05/21/2013 Discharge date: 05/22/2013  Primary Diagnosis: Spinal stenosis, lumbar spine  Admission Diagnoses:  Past Medical History  Diagnosis Date  . HTN (hypertension)     since age 70.  Also hx of PIH.  Marland Kitchen Gout     Uric acid level decreased from 9 to 4.1 with addition of uloric 40 mg qd (02/2011).  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 History of subacute thyroiditis   . History of anemia   . Postmenopausal   . History of blood transfusion 1963  . Chronic renal insufficiency, stage I     Stage I/II.  No proteinuria.  ARB added 12/2010 by nephrologist for renal protection  . Seborrheic dermatitis     Face; consider contact derm (around eyes); Roxan Hockey 09/2012--desonide 0.05% cream rx'd   Discharge Diagnoses:   Active Problems:   Spinal stenosis, lumbar region, with neurogenic claudication   Herniated lumbar intervertebral disc  Estimated body mass index is 32.6 kg/(m^2) as calculated from the following:   Height as of this encounter: 5\' 4"  (1.626 m).   Weight as of this encounter: 86.183 kg (190 lb).  Procedure:  Procedure(s) (LRB): LUMBAR LAMINECTOMY MICRODISCECTOMY L5-S1 LEFT  (Left)   Consults: None  HPI: The patient is a 52 year old female who presents with back pain. The patient reports low back symptoms including pain, low back pain, spasms, numbness, tingling and weakness and colder than other foot which began 3 weeks ago without any known injury. Symptoms are reported to be located on the left side more than the right and Symptoms include pain, muscle spasm, paresthesias, numbness, burning, weakness and pain in the calf. The pain radiates to the left buttock, left thigh, left posterior thigh, left lower leg and left posterior lower leg. The patient describes the pain as aching. The symptom onset was gradual.  The patient describes the severity of their symptoms as moderate in severity. The patient feels as if the symptoms are worsening. Symptoms are exacerbated by standing and sitting. Symptoms are relieved by recumbency and nonsteroidal anti-inflammatory drugs. Current treatment includes nonsteroidal anti-inflammatory drugs. Past treatment has included nonsteroidal anti-inflammatory drugs, muscle relaxants, physical therapy and back surgery. She noticed this after cleaning one day. She has had previous back surgery on L4-5 in [place]. She has had physical therapy which made it worse. The prednisone has helped but she continue to have pain in the low back as well as weakness in the left LE.   Laboratory Data: Hospital Outpatient Visit on 05/20/2013  Component Date Value Range Status  . aPTT 05/20/2013 23* 24 - 37 seconds Final  . Sodium 05/20/2013 136  135 - 145 mEq/L Final  . Potassium 05/20/2013 3.6  3.5 - 5.1 mEq/L Final  . Chloride 05/20/2013 102  96 - 112 mEq/L Final  . CO2 05/20/2013 22  19 - 32 mEq/L Final  . Glucose, Bld 05/20/2013 91  70 - 99 mg/dL Final  . BUN 78/29/5621 35* 6 - 23 mg/dL Final  . Creatinine, Ser 05/20/2013 1.17* 0.50 - 1.10 mg/dL Final  . Calcium 30/86/5784 9.3  8.4 - 10.5 mg/dL Final  . Total Protein 05/20/2013 6.6  6.0 - 8.3 g/dL Final  . Albumin 69/62/9528 3.4* 3.5 - 5.2 g/dL Final  . AST 41/32/4401 16  0 - 37 U/L Final  . ALT  05/20/2013 22  0 - 35 U/L Final  . Alkaline Phosphatase 05/20/2013 102  39 - 117 U/L Final  . Total Bilirubin 05/20/2013 0.2* 0.3 - 1.2 mg/dL Final  . GFR calc non Af Amer 05/20/2013 53* >90 mL/min Final  . GFR calc Af Amer 05/20/2013 61* >90 mL/min Final   Comment: (NOTE)                          The eGFR has been calculated using the CKD EPI equation.                          This calculation has not been validated in all clinical situations.                          eGFR's persistently <90 mL/min signify possible Chronic Kidney                           Disease.  Marland Kitchen Prothrombin Time 05/20/2013 12.5  11.6 - 15.2 seconds Final  . INR 05/20/2013 0.95  0.00 - 1.49 Final  . Color, Urine 05/20/2013 YELLOW  YELLOW Final  . APPearance 05/20/2013 CLEAR  CLEAR Final  . Specific Gravity, Urine 05/20/2013 1.019  1.005 - 1.030 Final  . pH 05/20/2013 6.0  5.0 - 8.0 Final  . Glucose, UA 05/20/2013 NEGATIVE  NEGATIVE mg/dL Final  . Hgb urine dipstick 05/20/2013 NEGATIVE  NEGATIVE Final  . Bilirubin Urine 05/20/2013 NEGATIVE  NEGATIVE Final  . Ketones, ur 05/20/2013 NEGATIVE  NEGATIVE mg/dL Final  . Protein, ur 16/05/9603 NEGATIVE  NEGATIVE mg/dL Final  . Urobilinogen, UA 05/20/2013 0.2  0.0 - 1.0 mg/dL Final  . Nitrite 54/04/8118 NEGATIVE  NEGATIVE Final  . Leukocytes, UA 05/20/2013 NEGATIVE  NEGATIVE Final   MICROSCOPIC NOT DONE ON URINES WITH NEGATIVE PROTEIN, BLOOD, LEUKOCYTES, NITRITE, OR GLUCOSE <1000 mg/dL.  . WBC 05/20/2013 11.2* 4.0 - 10.5 K/uL Final  . RBC 05/20/2013 4.25  3.87 - 5.11 MIL/uL Final  . Hemoglobin 05/20/2013 12.7  12.0 - 15.0 g/dL Final  . HCT 14/78/2956 38.4  36.0 - 46.0 % Final  . MCV 05/20/2013 90.4  78.0 - 100.0 fL Final  . MCH 05/20/2013 29.9  26.0 - 34.0 pg Final  . MCHC 05/20/2013 33.1  30.0 - 36.0 g/dL Final  . RDW 21/30/8657 13.1  11.5 - 15.5 % Final  . Platelets 05/20/2013 299  150 - 400 K/uL Final  . MRSA, PCR 05/20/2013 NEGATIVE  NEGATIVE Final  . Staphylococcus aureus 05/20/2013 NEGATIVE  NEGATIVE Final   Comment:                                 The Xpert SA Assay (FDA                          approved for NASAL specimens                          in patients over 48 years of age),                          is one component of  a comprehensive surveillance                          program.  Test performance has                          been validated by [facility] for patients greater                          than or equal to 47 year old.                           It is not intended                          to diagnose infection nor to                          guide or monitor treatment.     X-Rays:Dg Chest 2 View  05/20/2013   *RADIOLOGY REPORT*  Clinical Data: Preop examination (lumbar spinal surgery), initial encounter.  CHEST - 2 VIEW  Comparison: None.  Findings: Borderline enlarged cardiac silhouette with possible mild ectasia of the thoracic aorta.  No focal airspace opacity.  No pleural effusion or pneumothorax.  No evidence of edema.  Mild scoliotic curvature of the lower thoracic / upper lumbar spine, convex to the left, possibly positional.  IMPRESSION: Borderline enlarged cardiac silhouette with possible mild ectasia of the thoracic aorta.  Further evaluation with cardiac echo may be performed as clinically indicated.   Original Report Authenticated By: Tacey Ruiz, MD   Dg Lumbar Spine 2-3 Views  05/20/2013   CLINICAL DATA:  Hypertension, preop  EXAM: LUMBAR SPINE - 2-3 VIEW  COMPARISON:  MR 05/15/2013  FINDINGS: There is no evidence of lumbar spine fracture. There is a mild lumbar levoscoliosis apex L2-3 without evident underlying vertebral anomaly. Intervertebral disc spaces are maintained.  IMPRESSION: Mild lumbar levoscoliosis   Electronically Signed   By: Oley Balm M.D.   On: 05/20/2013 15:07   Mr Lumbar Spine Wo Contrast  05/16/2013   CLINICAL DATA:  Low back and left leg pain. No prior surgery. Left leg numbness and weakness.  EXAM: MRI LUMBAR SPINE WITHOUT CONTRAST  TECHNIQUE: Multiplanar, multisequence MR imaging was performed. No intravenous contrast was administered.  COMPARISON:  None.  FINDINGS: The alignment is anatomic. No vertebral body compression fracture or subluxation. Marrow signal homogeneous. Conus slightly low, ending at L1, but otherwise normal. Mild disk space narrowing L4-5. Normal paravertebral soft tissues. Unremarkable pelvis and SI joints.  The L1-2 and L2-3 disk spaces are normal.  At L3-4, the disc is  unremarkable. There is mild facet arthropathy without impingement.  At L4-5, the disc bulges mildly. There is advanced facet and ligamentum flavum hypertrophy. Bilateral neural foraminal narrowing is present. 4 mm synovial cyst on the right. Right L5 and bilateral L4 nerve root impingement are likely.  At L5-S1, there is a large extruded disc fragment downward centrally and to the left. Mild facet arthropathy is superimposed. The thecal sac is displaced left-to-right, and the left S1 nerve root is severely compressed. Mild foraminal narrowing does not appear  to affect either L5 nerve root.  IMPRESSION: The dominant abnormality is a large extruded disk fragment at L5-S1 downward with significant mass effect on the thecal sac and left S1 nerve root.  Mild annular bulging L4-5 with advanced posterior element hypertrophy and a 4 mm synovial cyst on the right. Right L5 and bilateral L4 nerve root impingement are likely.   Electronically Signed   By: Davonna Belling M.D.   On: 05/16/2013 09:43   Dg Spine Portable 1 View  05/21/2013   CLINICAL DATA:  Back pain, surgery.  EXAM: PORTABLE SPINE - 1 VIEW  COMPARISON:  05/21/2013  FINDINGS: Portable radiograph demonstrate a probe overlapping the L5-S1 disc space.  IMPRESSION: Probe marks the L5-S1 disc space.   Electronically Signed   By: Jerene Dilling M.D.   On: 05/21/2013 15:52   Dg Spine Portable 1 View  05/21/2013   CLINICAL DATA:  Back pain, surgery  EXAM: PORTABLE SPINE - 1 VIEW  COMPARISON:  None.  FINDINGS: Portable radiograph 3. Demonstrates an angled probe directed most closely toward S1, and a straight probe directed most closely toward L5.  IMPRESSION: L5-S1 marked.   Electronically Signed   By: Davonna Belling M.D.   On: 05/21/2013 15:36   Dg Spine Portable 1 View  05/21/2013   CLINICAL DATA:  Spinal surgery. Surgical level L5-S1.  EXAM: PORTABLE SPINE - 1 VIEW  COMPARISON:  05/21/2013.  FINDINGS: There is a curved surgical probe in place with tip  posterior to S1, and a straight surgical probe in place posterior to the L5-S1 interspace.  IMPRESSION: Intraoperative localization, as above.   Electronically Signed   By: Trudie Reed M.D.   On: 05/21/2013 15:14   Dg Spine Portable 1 View  05/21/2013   CLINICAL DATA:  Surgical level localization.  EXAM: PORTABLE SPINE - 1 VIEW  COMPARISON:  05/20/2013  FINDINGS: Cross-table portable lateral view shows 2 surgical needles or probes, the more superior with its tip along the superior margin of the L5 spinous process posterior to the mid body of L5 and the more inferior posterior to the pedicle of S1, with the surgical probe for needles evenly spaced above and below the L5-S1 disc.  IMPRESSION: Surgical localization portable lateral view as described.   Electronically Signed   By: Amie Portland M.D.   On: 05/21/2013 14:52    EKG: Orders placed during the hospital encounter of 05/21/13  . EKG     Hospital Course: TROI BECHTOLD is a 52 y.o. who was admitted to [facility]. They were brought to the operating room on 05/21/2013 and underwent Procedure(s): LUMBAR LAMINECTOMY MICRODISCECTOMY L5-S1 LEFT .  Patient tolerated the procedure well and was later transferred to the recovery room and then to the orthopaedic floor for postoperative care.  They were given PO and IV analgesics for pain control following their surgery.  They were given 24 hours of postoperative antibiotics of  Anti-infectives   Start     Dose/Rate Route Frequency Ordered Stop   05/21/13 2000  ceFAZolin (ANCEF) IVPB 1 g/50 mL premix     1 g 100 mL/hr over 30 Minutes Intravenous 3 times per day 05/21/13 1736 05/22/13 1532   05/21/13 1448  polymyxin B 500,000 Units, bacitracin 50,000 Units in sodium chloride irrigation 0.9 % 500 mL irrigation  Status:  Discontinued       As needed 05/21/13 1448 05/21/13 1604   05/21/13 1245  ceFAZolin (ANCEF) IVPB 2 g/50 mL premix  2 g 100 mL/hr over 30 Minutes Intravenous On call to  O.R. 05/21/13 1233 05/21/13 1420     and started on DVT prophylaxis in the form of Aspirin.   PT was ordered.  Discharge planning consulted to help with postop disposition and equipment needs.  Patient had a fair night on the evening of surgery.  They started to get up OOB with therapy on day one.  Patient was seen in rounds and was ready to go home.   Discharge Medications: Prior to Admission medications   Medication Sig Start Date End Date Taking? Authorizing Provider  acetaminophen (TYLENOL) 500 MG tablet Take 1,000 mg by mouth every 6 (six) hours as needed for pain.   Yes Historical Provider, MD  Azelaic Acid (FINACEA) 15 % cream Apply 1 application topically 2 (two) times daily. After skin is thoroughly washed and patted dry, gently but thoroughly massage a thin film of azelaic acid cream into the affected area twice daily, in the morning and evening.   Yes Historical Provider, MD  febuxostat (ULORIC) 40 MG tablet Take 40 mg by mouth every morning.    Yes Historical Provider, MD  Sulfacetamide Sodium-Sulfur 10-5 % EMUL Apply 1 application topically every 7 (seven) days.  01/28/13  Yes Historical Provider, MD  traMADol (ULTRAM) 50 MG tablet Take 50-100 mg by mouth every 6 (six) hours as needed for pain.   Yes Historical Provider, MD  valsartan (DIOVAN) 160 MG tablet Take 160 mg by mouth every morning.    Yes Historical Provider, MD  colchicine 0.6 MG tablet Take 0.6 mg by mouth 2 (two) times daily as needed. for gout flare    Historical Provider, MD  HYDROmorphone (DILAUDID) 2 MG tablet Take 1 tablet (2 mg total) by mouth every 3 (three) hours as needed. 05/22/13   Genelle Gather Babish, PA-C  methocarbamol (ROBAXIN) 500 MG tablet Take 1 tablet (500 mg total) by mouth every 6 (six) hours as needed. 05/22/13   Genelle Gather Babish, PA-C    Diet: Regular diet Activity:WBAT Follow-up:in 2 weeks Disposition - Home Discharged Condition: stable   Discharge Orders   Future Orders Complete By  Expires   Call MD / Call 911  As directed    Comments:     If you experience chest pain or shortness of breath, CALL 911 and be transported to the hospital emergency room.  If you develope a fever above 101 F, pus (white drainage) or increased drainage or redness at the wound, or calf pain, call your surgeon's office.   Constipation Prevention  As directed    Comments:     Drink plenty of fluids.  Prune juice may be helpful.  You may use a stool softener, such as Colace (over the counter) 100 mg twice a day.  Use MiraLax (over the counter) for constipation as needed.   Diet general  As directed    Discharge instructions  As directed    Comments:     Change your dressing daily. Shower only, no tub bath. Call if any temperatures greater than 101 or any wound complications: [phone] during the day and ask for Dr. Jeannetta Ellis nurse, Mackey Birchwood.   Driving restrictions  As directed    Comments:     No driving for 2 weeks   Increase activity slowly as tolerated  As directed    Lifting restrictions  As directed    Comments:     No lifting       Medication List  STOP taking these medications       cyclobenzaprine 10 MG tablet  Commonly known as:  FLEXERIL     ibuprofen 200 MG tablet  Commonly known as:  ADVIL,MOTRIN     predniSONE 20 MG tablet  Commonly known as:  DELTASONE      TAKE these medications       acetaminophen 500 MG tablet  Commonly known as:  TYLENOL  Take 1,000 mg by mouth every 6 (six) hours as needed for pain.     colchicine 0.6 MG tablet  Take 0.6 mg by mouth 2 (two) times daily as needed. for gout flare     DIOVAN 160 MG tablet  Generic drug:  valsartan  Take 160 mg by mouth every morning.     febuxostat 40 MG tablet  Commonly known as:  ULORIC  Take 40 mg by mouth every morning.     FINACEA 15 % cream  Generic drug:  Azelaic Acid  Apply 1 application topically 2 (two) times daily. After skin is thoroughly washed and patted dry, gently but  thoroughly massage a thin film of azelaic acid cream into the affected area twice daily, in the morning and evening.     HYDROmorphone 2 MG tablet  Commonly known as:  DILAUDID  Take 1 tablet (2 mg total) by mouth every 3 (three) hours as needed.     methocarbamol 500 MG tablet  Commonly known as:  ROBAXIN  Take 1 tablet (500 mg total) by mouth every 6 (six) hours as needed.     Sulfacetamide Sodium-Sulfur 10-5 % Emul  Apply 1 application topically every 7 (seven) days.     traMADol 50 MG tablet  Commonly known as:  ULTRAM  Take 50-100 mg by mouth every 6 (six) hours as needed for pain.           Follow-up Information   Follow up with GIOFFRE,RONALD A, MD. Schedule an appointment as soon as possible for a visit in 2 weeks.   Specialty:  Orthopedic Surgery   Contact information:   [address] [place] [address] [phone]       Signed: Dimitri Ped [facility] 05/26/2013, 10:55 AM

## 2014-01-18 ENCOUNTER — Other Ambulatory Visit: Payer: Self-pay | Admitting: Family Medicine

## 2014-01-18 NOTE — Telephone Encounter (Signed)
Rf request for valtrex.  Patient hasn't been seen since 05/13/2013.  Please advise rf.

## 2014-07-12 ENCOUNTER — Ambulatory Visit: Payer: BC Managed Care – PPO

## 2014-07-12 ENCOUNTER — Ambulatory Visit (INDEPENDENT_AMBULATORY_CARE_PROVIDER_SITE_OTHER): Payer: BC Managed Care – PPO | Admitting: Family Medicine

## 2014-07-12 DIAGNOSIS — Z23 Encounter for immunization: Secondary | ICD-10-CM

## 2014-08-01 ENCOUNTER — Ambulatory Visit (INDEPENDENT_AMBULATORY_CARE_PROVIDER_SITE_OTHER)
Admission: RE | Admit: 2014-08-01 | Discharge: 2014-08-01 | Disposition: A | Discharge status: Home / Self Care | Payer: BC Managed Care – PPO | Source: Ambulatory Visit | Attending: Family Medicine | Admitting: Family Medicine

## 2014-08-01 ENCOUNTER — Encounter: Payer: Self-pay | Admitting: Family Medicine

## 2014-08-01 ENCOUNTER — Other Ambulatory Visit: Payer: BC Managed Care – PPO

## 2014-08-01 ENCOUNTER — Ambulatory Visit (INDEPENDENT_AMBULATORY_CARE_PROVIDER_SITE_OTHER): Payer: BC Managed Care – PPO | Admitting: Family Medicine

## 2014-08-01 VITALS — BP 119/81 | HR 90 | Temp 99.2°F | Resp 16 | Ht 64.75 in | Wt 180.0 lb

## 2014-08-01 DIAGNOSIS — R0609 Other forms of dyspnea: Secondary | ICD-10-CM

## 2014-08-01 DIAGNOSIS — R5381 Other malaise: Secondary | ICD-10-CM

## 2014-08-01 DIAGNOSIS — R5382 Chronic fatigue, unspecified: Secondary | ICD-10-CM

## 2014-08-01 LAB — COMPREHENSIVE METABOLIC PANEL
ALBUMIN: 3.6 g/dL (ref 3.5–5.2)
ALT: 22 U/L (ref 0–35)
AST: 27 U/L (ref 0–37)
Alkaline Phosphatase: 93 U/L (ref 39–117)
BUN: 24 mg/dL — ABNORMAL HIGH (ref 6–23)
CALCIUM: 9 mg/dL (ref 8.4–10.5)
CO2: 24 meq/L (ref 19–32)
Chloride: 105 mEq/L (ref 96–112)
Creatinine, Ser: 1.2 mg/dL (ref 0.4–1.2)
GFR: 49.27 mL/min — ABNORMAL LOW (ref >60.00)
Glucose, Bld: 110 mg/dL — ABNORMAL HIGH (ref 70–99)
POTASSIUM: 4.6 meq/L (ref 3.5–5.1)
Sodium: 136 mEq/L (ref 135–145)
Total Bilirubin: 0.3 mg/dL (ref 0.2–1.2)
Total Protein: 6.6 g/dL (ref 6.0–8.3)

## 2014-08-01 LAB — SEDIMENTATION RATE: SED RATE: 51 mm/h — AB (ref 0–22)

## 2014-08-01 LAB — CBC WITH DIFFERENTIAL/PLATELET
BASOS PCT: 0.7 % (ref 0.0–3.0)
Basophils Absolute: 0 10*3/uL (ref 0.0–0.1)
Eosinophils Absolute: 0.1 10*3/uL (ref 0.0–0.7)
Eosinophils Relative: 1.5 % (ref 0.0–5.0)
HCT: 25.2 % — ABNORMAL LOW (ref 36.0–46.0)
HEMOGLOBIN: 7.6 g/dL — AB (ref 12.0–15.0)
LYMPHS PCT: 23 % (ref 12.0–46.0)
Lymphs Abs: 1.6 10*3/uL (ref 0.7–4.0)
MCHC: 30.1 g/dL (ref 30.0–36.0)
MCV: 70.9 fl — ABNORMAL LOW (ref 78.0–100.0)
Monocytes Absolute: 0.4 10*3/uL (ref 0.1–1.0)
Monocytes Relative: 5.9 % (ref 3.0–12.0)
Neutro Abs: 4.9 10*3/uL (ref 1.4–7.7)
Neutrophils Relative %: 68.9 % (ref 43.0–77.0)
Platelets: 392 10*3/uL (ref 150.0–400.0)
RBC: 3.55 Mil/uL — AB (ref 3.87–5.11)
RDW: 17.1 % — ABNORMAL HIGH (ref 11.5–15.5)
WBC: 7.1 10*3/uL (ref 4.0–10.5)

## 2014-08-01 LAB — C-REACTIVE PROTEIN: CRP: 1 mg/dL (ref 0.5–20.0)

## 2014-08-01 LAB — URIC ACID: Uric Acid, Serum: 4.8 mg/dL (ref 2.4–7.0)

## 2014-08-01 LAB — PHOSPHORUS: Phosphorus: 3.6 mg/dL (ref 2.3–4.6)

## 2014-08-01 LAB — MAGNESIUM: Magnesium: 2 mg/dL (ref 1.5–2.5)

## 2014-08-01 NOTE — Progress Notes (Signed)
OFFICE NOTE  08/01/2014  CC:  Chief Complaint  Patient presents with  . Fatigue    cone and go   . Blood In Stools   HPI: Patient is a 53 y.o. Caucasian female who is here for fatigue.   Says back surgery fall 2014 did not go that well, recovery did not go that well. Basically has felt fatigued, severity waxing and waning, if walks any significant distance or grade uphill she feels very winded. Also recently having some ice-chewing/craving.  Recently noting she feels cold intolerance.  Almost passed out once a few days ago. Has seen some BRB in stool recently, on toilet paper some.  Has long hx of having 3-4 stools per day. Checking bp's at home last few days and they have been in low normal range in mornings.   She feels the worst in mornings.  Better as day goes on. Only occ feeling of lower abd pain with her frequent stools.  No urinary complaints.  ROS: no chest pain, no palpitations, no hair loss, no rash, no joint swelling or pains, no headaches, no depression  Pertinent PMH:  Past Medical History  Diagnosis Date  . HTN (hypertension)     since age 79.  Also hx of PIH.  Marland Kitchen Gout     Uric acid level decreased from 9 to 4.1 with addition of uloric 40 mg qd (02/2011).  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 History of subacute thyroiditis   . History of anemia   . Postmenopausal   . History of blood transfusion 1963  . Chronic renal insufficiency, stage I     Stage I/II.  No proteinuria.  ARB added 12/2010 by nephrologist for renal protection  . Seborrheic dermatitis     Face; consider contact derm (around eyes); Lyndle Herrlich 09/2012--desonide 0.05% cream rx'd   Past Surgical History  Procedure Laterality Date  . Appendectomy  1964  . Nephrectomy  1964    Wilms tumor (right)  . Lumbar laminectomy  2004    microdiscectomy  . Breast reduction surgery  2003    bilateral  . Cesarean section  1998  . Lumbar  laminectomy/decompression microdiscectomy Left 05/21/2013    Procedure: LUMBAR LAMINECTOMY MICRODISCECTOMY L5-S1 LEFT ;  Surgeon: Tobi Bastos, MD;  Location: [facility];  Service: Orthopedics;  Laterality: Left;   History   Social History Narrative   Married, one child.   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   Relocated to [place] from [place] 2013.    No Tobacco.  Rare alcohol.  No drug use.    Family History  Problem Relation Age of Onset  . Hypertension Mother   . Heart disease Mother   . Hypertension Father   . Heart disease Father   . COPD Mother   . Alzheimer's disease Father   . Crohn's disease Father      MEDS:  Tylenol 500 mg q6h prn, uloric 3m qd, valtrex 1000, 2 tabs q12h x 2 doses prn, valsartan 1619mqd  PE: Blood pressure 119/81, pulse 90, temperature 99.2 F (37.3 C), temperature source Temporal, resp. rate 16, height 5' 4.75" (1.645 m), weight 180 lb (81.647 kg), SpO2 100 %. Gen: Alert, well appearing.  Patient is oriented to person, place, time, and situation. ENCHE:NIDPno injection, icteris, swelling, or exudate.  EOMI, PERRLA. Mouth: lips without lesion/swelling.  Oral mucosa pink and moist. Oropharynx without erythema, exudate, or  swelling.  Neck - No masses or thyromegaly or limitation in range of motion CV: RRR, no m/r/g.   LUNGS: CTA bilat, nonlabored resps, good aeration in all lung fields. ABD: soft, NT, ND, BS normal.  No hepatospenomegaly or mass.  No bruits. RUQ surgical scars. EXT: no clubbing, cyanosis, or edema.  Skin - no sores or suspicious lesions or rashes or color  Rectal: no hemorrhoids or fissures or irritation: no mass or stool palpable. Stool wipings (maroon-brown colored) + hemoccult in office today.  12 lead EKG today: NSR, rate 83, no signs of ischemia.  No LVH.  No ectopy.   IMPRESSION AND PLAN:  Fatigue and DOE: I feel like this is most likely physical deconditioning. EKG reassuring today. Check CXR, CBC, CMET,  ESR, CRP, Uric acid, Mag, Phos, TSH. If all normal, I think the next step is to go ahead and get her to GI for colon cancer screening and further evaluation of her BRBPR.  An After Visit Summary was printed and given to the patient.  FOLLOW UP: To be determined based on results of pending workup.

## 2014-08-01 NOTE — Progress Notes (Signed)
Pre visit review using our clinic review tool, if applicable. No additional management support is needed unless otherwise documented below in the visit note. 

## 2014-08-02 ENCOUNTER — Encounter: Payer: Self-pay | Admitting: Physician Assistant

## 2014-08-02 ENCOUNTER — Telehealth: Payer: Self-pay | Admitting: Family Medicine

## 2014-08-02 ENCOUNTER — Telehealth: Payer: Self-pay

## 2014-08-02 ENCOUNTER — Other Ambulatory Visit: Payer: Self-pay | Admitting: Family Medicine

## 2014-08-02 DIAGNOSIS — R55 Syncope and collapse: Secondary | ICD-10-CM

## 2014-08-02 DIAGNOSIS — D509 Iron deficiency anemia, unspecified: Secondary | ICD-10-CM

## 2014-08-02 DIAGNOSIS — K922 Gastrointestinal hemorrhage, unspecified: Secondary | ICD-10-CM

## 2014-08-02 DIAGNOSIS — K625 Hemorrhage of anus and rectum: Secondary | ICD-10-CM

## 2014-08-02 DIAGNOSIS — R5382 Chronic fatigue, unspecified: Secondary | ICD-10-CM

## 2014-08-02 LAB — TSH: TSH: 1.46 u[IU]/mL (ref 0.35–4.50)

## 2014-08-02 NOTE — Telephone Encounter (Signed)
Pls notify pt that her Hb was low (around 7). I am entering a referral to GI now. I will also get Lattie Haw started with the process of getting pt set up for blood transfusion. If GI can see her before transfusion is set up then GI evaluation takes precedence in her case.  Tell her other lab results not available yet.-thx

## 2014-08-02 NOTE — Telephone Encounter (Signed)
Diane called GI.

## 2014-08-02 NOTE — Telephone Encounter (Signed)
Spoke with Elaine Jenkins advised her appt at [facility] for typed and crossing on 08/08/2014 at 2:30pm. Her transfusion is 08/09/2014 at 8:00 am. Her GI appt is at [facility] 09/11/2013 at 9:00 am. Pt aware. Dr. Anitra Lauth advised pt to take Ferrous Sulfate 325 mg one tablet twice daily until transfusion. Pt would like to FYI Dr, Anitra Lauth about her history with transfusions. She states her mother told her that when she received one when she was little she was allergic to them. Pt doesn't know what reaction she had but was told this. Please advise.

## 2014-08-03 ENCOUNTER — Encounter (HOSPITAL_COMMUNITY): Payer: BC Managed Care – PPO

## 2014-08-03 ENCOUNTER — Telehealth: Payer: Self-pay | Admitting: Family Medicine

## 2014-08-03 NOTE — Telephone Encounter (Signed)
See Telephone note 08/03/14 I wrote about this.--PM

## 2014-08-03 NOTE — Telephone Encounter (Signed)
Talked with pt today. She recalls parents telling her of her hx of severe transfusion reactions as a child when she had transfusions related to her Wilm's tumor dx/treatment, etc. She feels like the anemia is likely chronic (since summer) and although she has ongoing bouts of some BRBPR it is small volume, so ongoing blood loss is likely minimal.  We decided to cancel plans for RBC transfusion.  Stick with plan of ferrous sulfate 325 bid and GI appt at the end of Jan 2016. However, will monitor Hb (first recheck next week) and will ask to be put on GI waiting list. I also relayed results of the remainder of pt's recent lab work, which was all normal/reassuring.

## 2014-08-08 ENCOUNTER — Other Ambulatory Visit (INDEPENDENT_AMBULATORY_CARE_PROVIDER_SITE_OTHER): Payer: BC Managed Care – PPO

## 2014-08-08 ENCOUNTER — Encounter (HOSPITAL_COMMUNITY): Payer: BC Managed Care – PPO

## 2014-08-08 ENCOUNTER — Inpatient Hospital Stay (HOSPITAL_COMMUNITY): Admission: RE | Admit: 2014-08-08 | Payer: BC Managed Care – PPO | Source: Ambulatory Visit

## 2014-08-08 DIAGNOSIS — D649 Anemia, unspecified: Secondary | ICD-10-CM

## 2014-08-08 LAB — POCT HEMOGLOBIN: Hemoglobin: 7.3 g/dL — AB (ref 12.2–16.2)

## 2014-08-09 ENCOUNTER — Encounter (HOSPITAL_COMMUNITY): Payer: BC Managed Care – PPO

## 2014-08-11 ENCOUNTER — Encounter: Payer: Self-pay | Admitting: Physician Assistant

## 2014-08-11 ENCOUNTER — Ambulatory Visit (INDEPENDENT_AMBULATORY_CARE_PROVIDER_SITE_OTHER): Payer: BC Managed Care – PPO | Admitting: Physician Assistant

## 2014-08-11 ENCOUNTER — Telehealth: Payer: Self-pay | Admitting: *Deleted

## 2014-08-11 VITALS — BP 150/82 | HR 92 | Ht 65.0 in | Wt 179.0 lb

## 2014-08-11 DIAGNOSIS — D649 Anemia, unspecified: Secondary | ICD-10-CM

## 2014-08-11 DIAGNOSIS — Z905 Acquired absence of kidney: Secondary | ICD-10-CM

## 2014-08-11 DIAGNOSIS — K625 Hemorrhage of anus and rectum: Secondary | ICD-10-CM

## 2014-08-11 DIAGNOSIS — Z800 Family history of malignant neoplasm of digestive organs: Secondary | ICD-10-CM

## 2014-08-11 NOTE — Progress Notes (Signed)
Reviewed and agree with management plan.  Sherryann Frese T. Carlisha Wisler, MD FACG 

## 2014-08-11 NOTE — Patient Instructions (Addendum)
Please go to the basement level to have your labs drawn on Monday 08-15-2014. You have been scheduled for a colonoscopy. Please follow written instructions given to you at your visit today.  Please pick up your prep kit at the pharmacy within the next 1-3 days. [facility] [address] [place] If you use inhalers (even only as needed), please bring them with you on the day of your procedure. Your physician has requested that you go to www.startemmi.com and enter the access code given to you at your visit today. This web site gives a general overview about your procedure. However, you should still follow specific instructions given to you by our office regarding your preparation for the procedure.

## 2014-08-11 NOTE — Progress Notes (Signed)
Patient ID: Elaine Jenkins, female   DOB: 11/14/1960, 53 y.o.   MRN: [id number]   Subjective:    Patient ID: Elaine Jenkins, female    DOB: [birth year], 53 y.o.   MRN: [id number]  HPI Elaine Jenkins is a pleasant 53 year old white female known to GI today referred by Dr. Anitra Lauth for evaluation of severe microcytic anemia. She has not had any prior GI evaluation. 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 She also required chemotherapy and radiation. She has mild chronic renal insufficiency and hypertension. She had undergone a lumbar laminectomy about a year ago. Patient had labs done October 2014 prior to her laminectomies showed hemoglobin of 12.7 hematocrit of 38. Labs were done on 08/01/2014 showing a hemoglobin of 7.6/hematocrit 25.2 and MCV of 70.9. Hemoglobin repeated on 1228 was 7.3. Patient relates that she had a very difficult time recovering from her laminectomy last winter gradually started feeling better and then thinking back feels that she has had onset of fatigue since the summer of 2015. She initially started noticing this with taking long walks. She has also had some intermittent lower abdominal cramping and diarrhea. Now over the past couple months has noted more frequent bowel movements and has intermittently seen blood mixed in with her stools. She says about 2 weeks ago she had more obvious red blood with a bowel movement. Her energy level has been significantly decreased over the past month. She is gotten to the point where sometimes she has to sit down and we'll taking a shower etc. due to fatigue. She denies shortness of breath or chest pain or dizziness. She's not had any ongoing persistent abdominal pain. Of note her father had history of colon cancer and Crohn's disease. Patient was supposed to get a transfusion earlier this week but is very anxious about being transfused because her mother had hold her years ago that she had transfusion reactions while she was  hospitalized with a Wilms tumor.  Review of Systems Pertinent positive and negative review of systems were noted in the above HPI section.  All other review of systems was otherwise negative.  Outpatient Encounter Prescriptions as of 08/11/2014  Medication Sig  . acetaminophen (TYLENOL) 500 MG tablet Take 1,000 mg by mouth every 6 (six) hours as needed for pain.  . Azelaic Acid (FINACEA) 15 % cream Apply 1 application topically 2 (two) times daily. After skin is thoroughly washed and patted dry, gently but thoroughly massage a thin film of azelaic acid cream into the affected area twice daily, in the morning and evening.  . colchicine 0.6 MG tablet Take 0.6 mg by mouth 2 (two) times daily as needed. for gout flare  . febuxostat (ULORIC) 40 MG tablet Take 40 mg by mouth every morning.   . ferrous sulfate 325 (65 FE) MG tablet Take 325 mg by mouth daily with breakfast.  . valACYclovir (VALTREX) 1000 MG tablet TAKE 2 TABLETS BY MOUTH EVERY 12 HOURS AS 2 DOSES (Patient taking differently: TAKE 2 TABLETS BY MOUTH EVERY 12 HOURS AS 2 DOSES prn)  . valsartan (DIOVAN) 160 MG tablet Take 160 mg by mouth every morning.    Allergies  Allergen Reactions  . Aldomet [Methyldopa]     Severe bone suppression  . Latex Rash    Blisters, rash  . Banana Itching  . Peach [Prunus Persica] Itching  . Thiazide-Type Diuretics Other (See Comments)    HCTZ increased gout flares  . Crab [Shellfish Allergy]  Rash   Patient Active Problem List   Diagnosis Date Noted  . S/p nephrectomy 08/11/2014  . Spinal stenosis, lumbar region, with neurogenic claudication 05/21/2013  . Herniated lumbar intervertebral disc 05/21/2013  . Lumbar strain 04/29/2013   History   Social History  . Marital Status: Married    Spouse Name: N/A    Number of Children: N/A  . Years of Education: N/A   Occupational History  . Not on file.   Social History Main Topics  . Smoking status: Former Research scientist (life sciences)  . Smokeless tobacco: Never  Used  . Alcohol Use: 0.6 oz/week    1 Cans of beer per week     Comment: rare  . Drug Use: No  . Sexual Activity: Yes   Other Topics Concern  . Not on file   Social History Narrative   Married, one child.   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   Relocated to [place] from [place], [place] 2013.    No Tobacco.  Rare alcohol.  No drug use.     Elaine Jenkins family history includes Alzheimer's disease in her father; COPD in her mother; Crohn's disease in her father; Heart disease in her father and mother; Hypertension in her father and mother.      Objective:    Filed Vitals:   08/11/14 0852  BP: 150/82  Pulse: 92    Physical Exam well-developed white female in no acute distress, pleasant pale is 150/82 pulse 92 height 5 foot 5 weight 179. HEENT; nontraumatic normocephalic EOMI PERRLA sclera anicteric, Supple ;no JVD, Cardiovascular; regular rate and rhythm with S1-S2 no murmur or gallop, Pulmonary; clear bilaterally, Abdomen ;soft mildly tender in the left lower quadrant there is no guarding or rebound no palpable mass or hepatosplenomegaly bowel sounds are present, Rectal ;exam not done, documented Hemoccult positive by PCP, Extremities no clubbing cyanosis or edema skin warm and dry, Psych; mood and affect appropriate       Assessment & Plan:   #1  53 yo female with profound microcytic anemia, Hemoccult-positive stool change in bowel habits with more frequent bowel movements and intermittent hematochezia. She is symptomatic from an anemia standpoint with fatigue. I am concerned about the possibility of colonic neoplasm with secondary bleeding. Other possibilities include IBD #2 status post right nephrectomy as a child secondary to Wilms tumor she had total body radiation and chemotherapy #3 history of transfusion reactions as a child #4 hypertension #5 mild chronic renal insufficiency  Plan; Patient has started ferrous sulfate 325 twice daily we'll increase this to 3 times  daily Will repeat hemoglobin on Monday, 08/15/2014, and patient is aware that if hemoglobin drops below 7 she will need transfusions.. We will need to do antibody workup prior to transfusions. We'll also check iron studies next week. Will schedule for colonoscopy next week with Dr. Fuller Plan. Procedure discussed in detail with the patient and she is agreeable to proceed.   Amy Genia Harold PA-C 08/11/2014

## 2014-08-11 NOTE — Telephone Encounter (Signed)
I called to inform the patient per Dr. Fuller Plan, that she is to hold her iron supplement between now the the colonoscopy scheduled for 08-17-2014.  The patient was hesitant to do so . She said the iron supplement makes her feel better.  Per Nicoletta Ba PA, I told her holding it will not effect her blood count and she is actively bleeding at this time. She said she is worried to stay off of it for this length of time. I told her to call our on call MD over the weekend if she has any trouble.

## 2014-08-12 DIAGNOSIS — C189 Malignant neoplasm of colon, unspecified: Secondary | ICD-10-CM

## 2014-08-12 DIAGNOSIS — N2889 Other specified disorders of kidney and ureter: Secondary | ICD-10-CM

## 2014-08-12 HISTORY — DX: Other specified disorders of kidney and ureter: N28.89

## 2014-08-12 HISTORY — DX: Malignant neoplasm of colon, unspecified: C18.9

## 2014-08-15 ENCOUNTER — Other Ambulatory Visit: Payer: Self-pay | Admitting: *Deleted

## 2014-08-15 ENCOUNTER — Telehealth: Payer: Self-pay | Admitting: Gastroenterology

## 2014-08-15 ENCOUNTER — Other Ambulatory Visit (INDEPENDENT_AMBULATORY_CARE_PROVIDER_SITE_OTHER): Payer: BLUE CROSS/BLUE SHIELD

## 2014-08-15 DIAGNOSIS — D509 Iron deficiency anemia, unspecified: Secondary | ICD-10-CM

## 2014-08-15 LAB — FERRITIN: Ferritin: 8.4 ng/mL — ABNORMAL LOW (ref 10.0–291.0)

## 2014-08-15 LAB — IBC PANEL
Iron: 22 ug/dL — ABNORMAL LOW (ref 42–145)
SATURATION RATIOS: 5.2 % — AB (ref 20.0–50.0)
Transferrin: 300.8 mg/dL (ref 212.0–360.0)

## 2014-08-15 LAB — HEMOGLOBIN AND HEMATOCRIT, BLOOD
HEMATOCRIT: 28.2 % — AB (ref 36.0–46.0)
Hemoglobin: 8.6 g/dL — ABNORMAL LOW (ref 12.0–15.0)

## 2014-08-15 MED ORDER — PEG-KCL-NACL-NASULF-NA ASC-C 100 G PO SOLR: 1.0000 | Freq: Once | ORAL | Status: DC

## 2014-08-15 NOTE — Telephone Encounter (Signed)
Prescription resent to patient's pharmacy. 

## 2014-08-16 ENCOUNTER — Telehealth: Payer: Self-pay | Admitting: Physician Assistant

## 2014-08-16 NOTE — Telephone Encounter (Signed)
Patient advised of labs.

## 2014-08-17 ENCOUNTER — Encounter (HOSPITAL_COMMUNITY)
Admission: RE | Disposition: A | Discharge status: Home / Self Care | Payer: Self-pay | Source: Ambulatory Visit | Attending: Gastroenterology

## 2014-08-17 ENCOUNTER — Ambulatory Visit (HOSPITAL_COMMUNITY)
Admission: RE | Admit: 2014-08-17 | Discharge: 2014-08-17 | Disposition: A | Discharge status: Home / Self Care | Payer: BLUE CROSS/BLUE SHIELD | Source: Ambulatory Visit | Attending: Gastroenterology | Admitting: Gastroenterology

## 2014-08-17 ENCOUNTER — Encounter (HOSPITAL_COMMUNITY)
Admission: RE | Disposition: A | Discharge status: Home / Self Care | Payer: BLUE CROSS/BLUE SHIELD | Source: Ambulatory Visit | Attending: Gastroenterology

## 2014-08-17 ENCOUNTER — Encounter (HOSPITAL_COMMUNITY): Payer: Self-pay | Admitting: *Deleted

## 2014-08-17 ENCOUNTER — Other Ambulatory Visit: Payer: Self-pay

## 2014-08-17 DIAGNOSIS — D508 Other iron deficiency anemias: Secondary | ICD-10-CM | POA: Insufficient documentation

## 2014-08-17 DIAGNOSIS — Z87891 Personal history of nicotine dependence: Secondary | ICD-10-CM | POA: Diagnosis not present

## 2014-08-17 DIAGNOSIS — D123 Benign neoplasm of transverse colon: Secondary | ICD-10-CM

## 2014-08-17 DIAGNOSIS — K648 Other hemorrhoids: Secondary | ICD-10-CM | POA: Insufficient documentation

## 2014-08-17 DIAGNOSIS — Z79899 Other long term (current) drug therapy: Secondary | ICD-10-CM | POA: Diagnosis not present

## 2014-08-17 DIAGNOSIS — I129 Hypertensive chronic kidney disease with stage 1 through stage 4 chronic kidney disease, or unspecified chronic kidney disease: Secondary | ICD-10-CM | POA: Diagnosis not present

## 2014-08-17 DIAGNOSIS — K921 Melena: Secondary | ICD-10-CM | POA: Diagnosis present

## 2014-08-17 DIAGNOSIS — K6389 Other specified diseases of intestine: Secondary | ICD-10-CM

## 2014-08-17 DIAGNOSIS — N189 Chronic kidney disease, unspecified: Secondary | ICD-10-CM | POA: Insufficient documentation

## 2014-08-17 DIAGNOSIS — D128 Benign neoplasm of rectum: Secondary | ICD-10-CM

## 2014-08-17 DIAGNOSIS — D125 Benign neoplasm of sigmoid colon: Secondary | ICD-10-CM

## 2014-08-17 DIAGNOSIS — D509 Iron deficiency anemia, unspecified: Secondary | ICD-10-CM

## 2014-08-17 HISTORY — PX: COLONOSCOPY: SHX5424

## 2014-08-17 SURGERY — COLONOSCOPY
Anesthesia: Moderate Sedation

## 2014-08-17 MED ORDER — FENTANYL CITRATE 0.05 MG/ML IJ SOLN
INTRAMUSCULAR | Status: AC
  Filled 2014-08-17: qty 4

## 2014-08-17 MED ORDER — DIPHENHYDRAMINE HCL 50 MG/ML IJ SOLN
INTRAMUSCULAR | Status: DC | PRN
  Administered 2014-08-17: 6.5 mg via INTRAVENOUS

## 2014-08-17 MED ORDER — FENTANYL CITRATE 0.05 MG/ML IJ SOLN
INTRAMUSCULAR | Status: DC | PRN
  Administered 2014-08-17 (×7): 25 ug via INTRAVENOUS

## 2014-08-17 MED ORDER — SODIUM CHLORIDE 0.9 % IV SOLN
INTRAVENOUS | Status: DC
  Administered 2014-08-17 (×2): 500 mL via INTRAVENOUS

## 2014-08-17 MED ORDER — MIDAZOLAM HCL 10 MG/2ML IJ SOLN
INTRAMUSCULAR | Status: AC
  Filled 2014-08-17: qty 2

## 2014-08-17 MED ORDER — MIDAZOLAM HCL 5 MG/5ML IJ SOLN
INTRAMUSCULAR | Status: DC | PRN
  Administered 2014-08-17 (×5): 2 mg via INTRAVENOUS

## 2014-08-17 MED ORDER — DIPHENHYDRAMINE HCL 50 MG/ML IJ SOLN
INTRAMUSCULAR | Status: AC
  Filled 2014-08-17: qty 1

## 2014-08-17 NOTE — Progress Notes (Signed)
Patient is scheduled for surgical consult for 08/19/14 10:30 arrival for 11:00 with Dr. Johney Jenkins at [facility].  She will have CT scan tomorrow am at 9:30 at [facility].  She is aware of all the above.

## 2014-08-17 NOTE — H&P (View-Only) (Signed)
Patient ID: Elaine Jenkins, female   DOB: 1960-11-13, 54 y.o.   MRN: [id number]   Subjective:    Patient ID: Elaine Jenkins, female    DOB: [birth year], 54 y.o.   MRN: [id number]  HPI Elaine Jenkins is a pleasant 54 year old white female known to GI today referred by Dr. Anitra Lauth for evaluation of severe microcytic anemia. She has not had any prior GI evaluation. She has history of a nephrectomy at age 78 because of a Wilms tumor. She also required chemotherapy and radiation. She has mild chronic renal insufficiency and hypertension. She had undergone a lumbar laminectomy about a year ago. Patient had labs done October 2014 prior to her laminectomies showed hemoglobin of 12.7 hematocrit of 38. Labs were done on 08/01/2014 showing a hemoglobin of 7.6/hematocrit 25.2 and MCV of 70.9. Hemoglobin repeated on 1228 was 7.3. Patient relates that she had a very difficult time recovering from her laminectomy last winter gradually started feeling better and then thinking back feels that she has had onset of fatigue since the summer of 2015. She initially started noticing this with taking long walks. She has also had some intermittent lower abdominal cramping and diarrhea. Now over the past couple months has noted more frequent bowel movements and has intermittently seen blood mixed in with her stools. She says about 2 weeks ago she had more obvious red blood with a bowel movement. Her energy level has been significantly decreased over the past month. She is gotten to the point where sometimes she has to sit down and we'll taking a shower etc. due to fatigue. She denies shortness of breath or chest pain or dizziness. She's not had any ongoing persistent abdominal pain. Of note her father had history of colon cancer and Crohn's disease. Patient was supposed to get a transfusion earlier this week but is very anxious about being transfused because her mother had hold her years ago that she had transfusion reactions while she was  hospitalized with a Wilms tumor.  Review of Systems Pertinent positive and negative review of systems were noted in the above HPI section.  All other review of systems was otherwise negative.  Outpatient Encounter Prescriptions as of 08/11/2014  Medication Sig  . acetaminophen (TYLENOL) 500 MG tablet Take 1,000 mg by mouth every 6 (six) hours as needed for pain.  . Azelaic Acid (FINACEA) 15 % cream Apply 1 application topically 2 (two) times daily. After skin is thoroughly washed and patted dry, gently but thoroughly massage a thin film of azelaic acid cream into the affected area twice daily, in the morning and evening.  . colchicine 0.6 MG tablet Take 0.6 mg by mouth 2 (two) times daily as needed. for gout flare  . febuxostat (ULORIC) 40 MG tablet Take 40 mg by mouth every morning.   . ferrous sulfate 325 (65 FE) MG tablet Take 325 mg by mouth daily with breakfast.  . valACYclovir (VALTREX) 1000 MG tablet TAKE 2 TABLETS BY MOUTH EVERY 12 HOURS AS 2 DOSES (Patient taking differently: TAKE 2 TABLETS BY MOUTH EVERY 12 HOURS AS 2 DOSES prn)  . valsartan (DIOVAN) 160 MG tablet Take 160 mg by mouth every morning.    Allergies  Allergen Reactions  . Aldomet [Methyldopa]     Severe bone suppression  . Latex Rash    Blisters, rash  . Banana Itching  . Peach [Prunus Persica] Itching  . Thiazide-Type Diuretics Other (See Comments)    HCTZ increased gout flares  . Crab [Shellfish Allergy]  Rash   Patient Active Problem List   Diagnosis Date Noted  . S/p nephrectomy 08/11/2014  . Spinal stenosis, lumbar region, with neurogenic claudication 05/21/2013  . Herniated lumbar intervertebral disc 05/21/2013  . Lumbar strain 04/29/2013   History   Social History  . Marital Status: Married    Spouse Name: N/A    Number of Children: N/A  . Years of Education: N/A   Occupational History  . Not on file.   Social History Main Topics  . Smoking status: Former Research scientist (life sciences)  . Smokeless tobacco: Never  Used  . Alcohol Use: 0.6 oz/week    1 Cans of beer per week     Comment: rare  . Drug Use: No  . Sexual Activity: Yes   Other Topics Concern  . Not on file   Social History Narrative   Married, one child.   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   Relocated to [place] from [place], [place] 2013.    No Tobacco.  Rare alcohol.  No drug use.     Ms. Soderman family history includes Alzheimer's disease in her father; COPD in her mother; Crohn's disease in her father; Heart disease in her father and mother; Hypertension in her father and mother.      Objective:    Filed Vitals:   08/11/14 0852  BP: 150/82  Pulse: 92    Physical Exam well-developed white female in no acute distress, pleasant pale is 150/82 pulse 92 height 5 foot 5 weight 179. HEENT; nontraumatic normocephalic EOMI PERRLA sclera anicteric, Supple ;no JVD, Cardiovascular; regular rate and rhythm with S1-S2 no murmur or gallop, Pulmonary; clear bilaterally, Abdomen ;soft mildly tender in the left lower quadrant there is no guarding or rebound no palpable mass or hepatosplenomegaly bowel sounds are present, Rectal ;exam not done, documented Hemoccult positive by PCP, Extremities no clubbing cyanosis or edema skin warm and dry, Psych; mood and affect appropriate       Assessment & Plan:   #1  54 yo female with profound microcytic anemia, Hemoccult-positive stool change in bowel habits with more frequent bowel movements and intermittent hematochezia. She is symptomatic from an anemia standpoint with fatigue. I am concerned about the possibility of colonic neoplasm with secondary bleeding. Other possibilities include IBD #2 status post right nephrectomy as a child secondary to Wilms tumor she had total body radiation and chemotherapy #3 history of transfusion reactions as a child #4 hypertension #5 mild chronic renal insufficiency  Plan; Patient has started ferrous sulfate 325 twice daily we'll increase this to 3 times  daily Will repeat hemoglobin on Monday, 08/15/2014, and patient is aware that if hemoglobin drops below 7 she will need transfusions.. We will need to do antibody workup prior to transfusions. We'll also check iron studies next week. Will schedule for colonoscopy next week with Dr. Fuller Plan. Procedure discussed in detail with the patient and she is agreeable to proceed.   Raechell Singleton Genia Harold PA-C 08/11/2014

## 2014-08-17 NOTE — Interval H&P Note (Signed)
History and Physical Interval Note:  08/17/2014 7:54 AM  Elaine Jenkins  has presented today for surgery, with the diagnosis of Severe anemia REctal bleeding Family history of colon cancer  The various methods of treatment have been discussed with the patient and family. After consideration of risks, benefits and other options for treatment, the patient has consented to  Procedure(s): COLONOSCOPY (N/A) as a surgical intervention .  The patient's history has been reviewed, patient examined, no change in status, stable for surgery.  I have reviewed the patient's chart and labs.  Questions were answered to the patient's satisfaction.     Pricilla Riffle. Fuller Plan MD

## 2014-08-17 NOTE — Op Note (Signed)
[facility] [place], [id number]   COLONOSCOPY PROCEDURE REPORT  PATIENT: Shaquita, Fort  MR#: [id number] BIRTHDATE: 1961/01/21 , 13  yrs. old GENDER: female ENDOSCOPIST: Ladene Artist, MD, [facility] REFERRED ER:[id number] Anitra Lauth, M.D. PROCEDURE DATE:  08/17/2014 PROCEDURE:   Colonoscopy with biopsy and Colonoscopy with snare polypectomy First Screening Colonoscopy - Avg.  risk and is 50 yrs.  old or older - No.  Prior Negative Screening - Now for repeat screening. N/A  History of Adenoma - Now for follow-up colonoscopy & has been > or = to 3 yrs.  N/A  Polyps Removed Today? Yes. ASA CLASS:   Class II INDICATIONS:iron deficiency anemia and hematochezia. MEDICATIONS: Fentanyl 175 mcg IV, Versed 10 mg IV, and Benadryl 6.5 mg IV DESCRIPTION OF PROCEDURE:   After the risks benefits and alternatives of the procedure were thoroughly explained, informed consent was obtained.  The digital rectal exam revealed no abnormalities of the rectum.   The Pentax Ped Colon [id number] endoscope was introduced through the anus and advanced to the cecum, which was identified by both the appendix and ileocecal valve. No adverse events experienced.   The quality of the prep was good, using MoviPrep  The instrument was then slowly withdrawn as the colon was fully examined.    COLON FINDINGS: A circumferential, firm, friable and ulcerated mass, measuring 4 X 3 cm in size, was found in the sigmoid colon distal marging was 25 cm from anal verge. It was partialy obstructing and could not be traversed with the pediatric colonoscope. I was able to traverse with a standard upper endoscope.  Multiple biopsies of the lesion were performed.  A sessile polyp measuring 7 mm in size was found in the transverse colon.  A polypectomy was performed with a cold snare.  The resection was complete, the polyp tissue was completely retrieved and sent to histology.   A sessile polyp measuring 10  mm in size was found in the transverse colon.  A polypectomy was performed using snare cautery.  The resection was complete, the polyp tissue was completely retrieved and sent to histology.   Four sessile polyps measuring 5-7 mm in size were found.  Polypectomies were performed with a cold snare.  The resection was complete, the polyp tissue was completely retrieved and sent to histology.   The examination was otherwise normal. Retroflexed views revealed internal Grade I hemorrhoids. The time to cecum=10 minutes 00 seconds.  Withdrawal time=15 minutes 00 seconds.  The scope was withdrawn and the procedure completed.  COMPLICATIONS: There were no immediate complications.  ENDOSCOPIC IMPRESSION: 1.   Circumferential mass in the sigmoid colon; multiple biopsies performed 2.   Sessile polyp was found in the transverse colon; polypectomy performed with a cold snare 3.   Sessile polyp was found in the transverse colon; polypectomy performed using snare cautery 4.   Four sessile polyps were found; polypectomies performed with a cold snare 5.   Grade l internal hemorrhoids     RECOMMENDATIONS: 1.  Await pathology results 2.  Hold Aspirin and all other NSAIDS for 2 weeks. 3.  My office will arrange for you to have a CT scan of abdomen/pelvis. 4.  My office will arrange for you to meet with a surgeon. 5.  Repeat Colonoscopy in 1 year.  eSigned:  Ladene Artist, MD, [facility] 08/17/2014 10:06 AM     PATIENT NAME:  Selena, Swaminathan MR#: [id number]

## 2014-08-18 ENCOUNTER — Encounter (HOSPITAL_COMMUNITY): Payer: Self-pay | Admitting: Gastroenterology

## 2014-08-18 ENCOUNTER — Other Ambulatory Visit: Payer: Self-pay

## 2014-08-18 ENCOUNTER — Ambulatory Visit (INDEPENDENT_AMBULATORY_CARE_PROVIDER_SITE_OTHER)
Admission: RE | Admit: 2014-08-18 | Discharge: 2014-08-18 | Disposition: A | Discharge status: Home / Self Care | Payer: BLUE CROSS/BLUE SHIELD | Source: Ambulatory Visit | Attending: Gastroenterology | Admitting: Gastroenterology

## 2014-08-18 DIAGNOSIS — C189 Malignant neoplasm of colon, unspecified: Secondary | ICD-10-CM

## 2014-08-18 DIAGNOSIS — K6389 Other specified diseases of intestine: Secondary | ICD-10-CM

## 2014-08-18 DIAGNOSIS — R935 Abnormal findings on diagnostic imaging of other abdominal regions, including retroperitoneum: Secondary | ICD-10-CM

## 2014-08-18 HISTORY — DX: Malignant neoplasm of colon, unspecified: C18.9

## 2014-08-18 MED ORDER — IOHEXOL 300 MG/ML  SOLN
100.0000 mL | Freq: Once | INTRAMUSCULAR | Status: AC | PRN
  Administered 2014-08-18: 100 mL via INTRAVENOUS

## 2014-08-19 ENCOUNTER — Other Ambulatory Visit: Payer: Self-pay | Admitting: Surgery

## 2014-08-19 ENCOUNTER — Other Ambulatory Visit (INDEPENDENT_AMBULATORY_CARE_PROVIDER_SITE_OTHER): Payer: Self-pay | Admitting: Surgery

## 2014-08-19 DIAGNOSIS — C187 Malignant neoplasm of sigmoid colon: Secondary | ICD-10-CM

## 2014-08-19 NOTE — H&P (Signed)
Alvy Bimler 08/19/2014 11:06 AM Location: [facility] Patient #: [id number] DOB: 1961-06-22 Married / Language: Cleophus Molt / Race: White Female History of Present Illness Adin Hector MD; 08/19/2014 1:47 PM) Patient words: colon mass.  The patient is a 54 year old female who presents with colorectal cancer. Patient sent to me by gastroenterologist Dr. Fuller Plan for concern of cancer. She comes today with her husband. Pleasant woman with problems for the past few years. Required 2 back surgeries. Psychologic a long time to recover. Had increasing fatigue throughout the year. Began to notice some rectal bleeding. Dyspnea became more severe. Also had intermittent episodes of fullness and bloating. Found to be severely anemic with hemoglobin in the sevens. Started on iron. Colonoscopy revealed bulky tumor and sigmoid colon. Surgical consultation requested. Patient's father diagnosed with cancer in the colon in his 76s. Also had a history of Crohn's. She does not think she has inflammatory bowel disease was concerned for that. She is very anxious about having any surgery. Since she started on the iron, her fatigue has worsened. Her exercise tolerance has improved. She is to be able to walk a couple miles a day but given her back and anemia problems the past few years has not been doing that some much. Has had some urgency with frequent bowel movements. Usually less than 4 a day. 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 Recalls being told she had severe reactions to blood transfusions and is very afraid of having any. Not she was while the first survivors half post-adjuvant chemotherapy for the Wilms tumor. No evidence of recurrence. She has many questions and concerns about cancer and surgery. Other Problems Briant Cedar, CMA; 08/19/2014 11:06 AM) Back Pain Cancer Colon Cancer High blood pressure Thyroid Disease Transfusion  history  Past Surgical History Briant Cedar, [place]; 08/19/2014 11:06 AM) Appendectomy Cesarean Section - 1 Colon Polyp Removal - Colonoscopy Mammoplasty; Reduction Bilateral. Nephrectomy Right. Spinal Surgery - Lower Back  Diagnostic Studies History Briant Cedar, CMA; 08/19/2014 11:06 AM) Colonoscopy within last year Mammogram 1-3 years ago Pap Smear >5 years ago  Allergies Briant Cedar, [place]; 08/19/2014 11:08 AM) Latex PEACHES  Medication History Briant Cedar, CMA; 08/19/2014 11:09 AM) Melburn Popper (40MG  Tablet, Oral) Active. Diovan (160MG  Tablet, Oral) Active. Iron Plus (Oral) Active. ValACYclovir HCl (1GM Tablet, Oral) Active. Finacea (15% Gel, External) Active. MoviPrep (100GM For Solution, Oral) Active.  Social History Briant Cedar, [place]; 08/19/2014 11:06 AM) Alcohol use Occasional alcohol use. Caffeine use Carbonated beverages, Tea. No drug use Tobacco use Former smoker.  Family History Briant Cedar, [place]; 08/19/2014 11:06 AM) Alcohol Abuse Brother. Anesthetic complications Father. Arthritis Brother, Mother. Colon Cancer Father. Colon Polyps Father. Heart Disease Father. Hypertension Brother, Father, Sister. Prostate Cancer Brother. Rectal Cancer Father. Respiratory Condition Mother. Seizure disorder Son.  Pregnancy / Birth History Briant Cedar, CMA; 08/19/2014 11:06 AM) Age at menarche 55 years. Age of menopause 1-50 Contraceptive History Oral contraceptives. Gravida 4 Maternal age 62-35 Para 1     Review of Systems Briant Cedar CMA; 08/19/2014 11:06 AM) General Present- Chills and Fatigue. Not Present- Appetite Loss, Fever, Night Sweats, Weight Gain and Weight Loss. Skin Present- Dryness. Not Present- Change in Wart/Mole, Hives, Jaundice, New Lesions, Non-Healing Wounds, Rash and Ulcer. HEENT Present- Seasonal Allergies and Wears glasses/contact lenses. Not Present- Earache, Hearing Loss, Hoarseness, Nose  Bleed, Oral Ulcers, Ringing in the Ears, Sinus Pain, Sore Throat, Visual Disturbances and Yellow Eyes. Breast Not  Present- Breast Mass, Breast Pain, Nipple Discharge and Skin Changes. Cardiovascular Present- Shortness of Breath. Not Present- Chest Pain, Difficulty Breathing Lying Down, Leg Cramps, Palpitations, Rapid Heart Rate and Swelling of Extremities. Gastrointestinal Present- Bloody Stool and Change in Bowel Habits. Not Present- Abdominal Pain, Bloating, Chronic diarrhea, Constipation, Difficulty Swallowing, Excessive gas, Gets full quickly at meals, Hemorrhoids, Indigestion, Nausea, Rectal Pain and Vomiting. Female Genitourinary Not Present- Frequency, Nocturia, Painful Urination, Pelvic Pain and Urgency. Musculoskeletal Present- Back Pain. Not Present- Joint Pain, Joint Stiffness, Muscle Pain, Muscle Weakness and Swelling of Extremities. Neurological Not Present- Decreased Memory, Fainting, Headaches, Numbness, Seizures, Tingling, Tremor, Trouble walking and Weakness. Psychiatric Not Present- Anxiety, Bipolar, Change in Sleep Pattern, Depression, Fearful and Frequent crying. Endocrine Not Present- Cold Intolerance, Excessive Hunger, Hair Changes, Heat Intolerance, Hot flashes and New Diabetes. Hematology Not Present- Easy Bruising, Excessive bleeding, Gland problems, HIV and Persistent Infections.  Vitals Briant Cedar CMA; 08/19/2014 11:10 AM) 08/19/2014 11:10 AM Weight: 177.25 lb Height: 65in Body Surface Area: 1.92 m Body Mass Index: 29.5 kg/m Temp.: 98.16F  Pulse: 74 (Regular)  BP: 142/80 (Sitting, Left Arm, Standard)     Physical Exam Adin Hector MD; 08/19/2014 12:19 PM)  General Mental Status-Alert. General Appearance-Not in acute distress, Not Sickly. Orientation-Oriented X3. Hydration-Well hydrated. Voice-Normal.  Integumentary Global Assessment Upon inspection and palpation of skin surfaces of the - Axillae: non-tender, no inflammation or  ulceration, no drainage. and Distribution of scalp and body hair is normal. General Characteristics Temperature - normal warmth is noted.  Head and Neck Head-normocephalic, atraumatic with no lesions or palpable masses. Face Global Assessment - atraumatic, no absence of expression. Neck Global Assessment - no abnormal movements, no bruit auscultated on the right, no bruit auscultated on the left, no decreased range of motion, non-tender. Trachea-midline. Thyroid Gland Characteristics - non-tender.  Eye Eyeball - Left-Extraocular movements intact, No Nystagmus. Eyeball - Right-Extraocular movements intact, No Nystagmus. Cornea - Left-No Hazy. Cornea - Right-No Hazy. Sclera/Conjunctiva - Left-No scleral icterus, No Discharge. Sclera/Conjunctiva - Right-No scleral icterus, No Discharge. Pupil - Left-Direct reaction to light normal. Pupil - Right-Direct reaction to light normal.  ENMT Ears Pinna - Left - no drainage observed, no generalized tenderness observed. Right - no drainage observed, no generalized tenderness observed. Nose and Sinuses External Inspection of the Nose - no destructive lesion observed. Inspection of the nares - Left - quiet respiration. Right - quiet respiration. Mouth and Throat Lips - Upper Lip - no fissures observed, no pallor noted. Lower Lip - no fissures observed, no pallor noted. Nasopharynx - no discharge present. Oral Cavity/Oropharynx - Tongue - no dryness observed. Oral Mucosa - no cyanosis observed. Hypopharynx - no evidence of airway distress observed.  Chest and Lung Exam Inspection Movements - Normal and Symmetrical. Accessory muscles - No use of accessory muscles in breathing. Palpation Palpation of the chest reveals - Non-tender. Auscultation Breath sounds - Normal and Clear.  Breast Note: Scars from prior breast reduction surgery.   Cardiovascular Auscultation Rhythm - Regular. Murmurs & Other Heart Sounds -  Auscultation of the heart reveals - No Murmurs and No Systolic Clicks.  Abdomen Inspection Inspection of the abdomen reveals - No Visible peristalsis and No Abnormal pulsations. Umbilicus - No Bleeding, No Urine drainage. Palpation/Percussion Palpation and Percussion of the abdomen reveal - Soft, Non Tender, No Rebound tenderness, No Rigidity (guarding) and No Cutaneous hyperesthesia.   Female Genitourinary Sexual Maturity Tanner 5 - Adult hair pattern. Note: No vaginal bleeding nor discharge  Peripheral Vascular Upper Extremity Inspection - Left - No Cyanotic nailbeds, Not Ischemic. Right - No Cyanotic nailbeds, Not Ischemic.  Neurologic Neurologic evaluation reveals -normal attention span and ability to concentrate, able to name objects and repeat phrases. Appropriate fund of knowledge , normal sensation and normal coordination. Mental Status Affect - not angry, not paranoid. Cranial Nerves-Normal Bilaterally. Gait-Normal.  Neuropsychiatric Mental status exam performed with findings of-able to articulate well with normal speech/language, rate, volume and coherence, thought content normal with ability to perform basic computations and apply abstract reasoning and no evidence of hallucinations, delusions, obsessions or homicidal/suicidal ideation.  Musculoskeletal Global Assessment Spine, Ribs and Pelvis - no instability, subluxation or laxity. Right Upper Extremity - no instability, subluxation or laxity.  Lymphatic Head & Neck  General Head & Neck Lymphatics: Bilateral - Description - No Localized lymphadenopathy. Axillary  General Axillary Region: Bilateral - Description - No Localized lymphadenopathy. Femoral & Inguinal  Generalized Femoral & Inguinal Lymphatics: Left - Description - No Localized lymphadenopathy. Right - Description - No Localized lymphadenopathy.  Ct Abdomen Pelvis W Contrast  08/18/2014   CLINICAL DATA:  54 year old female with new colonic  mass recently seen at colonoscopy, with blood in the stool. Abdominal cramping worsening over the past several months. History Wilms tumor diagnosed in 1964 status post right radical nephrectomy.  EXAM: CT ABDOMEN AND PELVIS WITH CONTRAST  TECHNIQUE: Multidetector CT imaging of the abdomen and pelvis was performed using the standard protocol following bolus administration of intravenous contrast.  CONTRAST:  141mL OMNIPAQUE IOHEXOL 300 MG/ML  SOLN  COMPARISON:  No priors.  FINDINGS: Lower chest:  Unremarkable.  Hepatobiliary: No cystic or solid hepatic lesion. No intra or extrahepatic biliary ductal dilatation. Gallbladder is unremarkable in appearance.  Pancreas: 1.3 x 0.7 cm low-attenuation lesion in the proximal body of the pancreas, which appears to have a central calcification (image 25 of series 2). 2 other low-attenuation lesions in the distal body of the pancreas, measuring up to 7 mm. No definite pancreatic ductal dilatation. No peripancreatic lymphadenopathy or peripancreatic fluid collection.  Spleen: Unremarkable.  Adrenals/Urinary Tract: Status post right radical nephrectomy. Complex multilocular cystic lesion in the upper pole of the left kidney with innumerable thin-walled cysts, estimated to measure 7.9 x 3.6 x 5.2 cm. 2.1 cm fatty attenuation lesion in the lower pole of the left kidney, compatible with an angiomyolipoma. Several other subcentimeter low attenuation lesions in the left kidney are too small to definitively characterize, but statistically likely a tiny cysts. No hydroureteronephrosis. Urinary bladder is normal in appearance. Right adrenal gland is not visualized and may be surgically absent. There is a 1.2 x 1.2 cm lesion in the left adrenal gland (image 20 of series 2).  Stomach/Bowel: Normal appearance of the stomach. No pathologic dilatation of small bowel or colon. In the distal sigmoid colon (best appreciated on images 53-57 of series 2), there is a long segment of irregular  mass-like thickening, corresponding to the recently diagnosed colonic mass. This spans a length of at least 5.1 cm. No definite lymphadenopathy in the surrounding mesocolon.  Vascular/Lymphatic: Atherosclerosis throughout the abdominal and pelvic vasculature, without evidence of aneurysm or dissection. No lymphadenopathy identified in the abdomen or pelvis.  Reproductive: Uterus and ovaries are atrophic. Notably, the left ovary appears intimately associated with the sigmoid colon, best appreciated on image 61 of series 2, and sagittal image 74 of series 603.  Other: No significant volume of ascites.  No pneumoperitoneum.  Musculoskeletal: There are coarsened trabeculae in the  left proximal femur and in the left hemipelvis where there is also some mild expansion and thickening of the cortex, most compatible with underlying Paget's disease. There are no aggressive appearing lytic or blastic lesions noted in the visualized portions of the skeleton.  IMPRESSION: 1. Segment of irregular mass-like thickening of the distal sigmoid colon, corresponding to the recently diagnosed metastatic colonoscopy. There is a 1.2 cm nonspecific nodule in the left adrenal gland, which could represent a metastatic lesion, or could be a primary adrenal lesion such as an adenoma. This is incompletely characterized on today's examination. No other definite signs suggestive of metastatic disease in the abdomen or pelvis. No surrounding mesocolic lymphadenopathy. 2. Complex multilocular cystic mass in the upper pole of the left kidney. The possibility of a cystic renal cell carcinoma warrants consideration, however, the possibility of a benign lesion such as an adult multilocular cystic nephroma warrants consideration. Further evaluation with MRI of the abdomen with and without IV gadolinium is recommended to better evaluate this finding. 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  3. Multiple small cystic lesions in the body of the pancreas, 1 of which contains a central calcification. 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 Attention at time of followup MRI is recommended. 4. 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   Electronically Signed   By: Vinnie Langton M.D.   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 Despite her numerous abdominal surgeries, reasonable start out with a robotic or laparoscopic minimally invasive approach. I spent over an hour talking to the patient and her husband with history and physical exam and explaining. I gave a copy of the pathology report to the patient. Numerous questions asked. Answered. They feel comfortable proceeding with surgery at some point.  Obtain CEA and CT of chest for completion of colon cancer workup.  I strongly recommend she continue exercising more. Help improve her tolerance of surgery.  The anatomy & physiology of the digestive tract was discussed. The pathophysiology of the colon was discussed. Natural history risks without surgery was discussed. I feel the risks of no intervention will lead to serious problems that outweigh the operative risks; therefore, I recommended a partial colectomy to remove the pathology. Minimally invasive (Robotic/Laparoscopic) & open techniques were discussed.  Risks such as bleeding, infection, abscess, leak, reoperation, possible ostomy, hernia, heart attack, death, and other risks were discussed. I noted a good likelihood this will help address the problem. Goals of post-operative recovery were discussed as well. Need for bowel regimen and healthy physical activity to optimize recovery  noted as well. We will work to minimize complications. Educational materials were given as well. Questions were answered. The patient expresses understanding & wishes to proceed with surgery.  Current Plans Started Valium 5MG , 1-2 Tablet every eight hours, as needed, #6, 08/19/2014, No Refill. Pt Education - [facility] Colon Bowel Prep 2015 Miralax/Antibiotics Started Neomycin Sulfate 500MG , 2 (two) Tablet SEE NOTE, #6, 08/19/2014, No Refill. Local Order: TAKE TWO TABLETS AT 2 PM, 3 PM, AND 10 PM THE DAY PRIOR TO SURGERY Started Flagyl 500MG , 2 (two) Tablet SEE NOTE, #6, 08/19/2014, No Refill. Local Order: Take at 2pm,  3pm, and 10pm the day prior to your colon operation Schedule for Surgery Pt Education - [facility] Good Bowel Health (Teniola Tseng) Pt Education - [facility] Abdominal Surgery (Thora Scherman) Pt Education - [facility] Pain Control (Loomis Anacker) The anatomy & physiology of the digestive tract was discussed. The pathophysiology of the colon was discussed. Natural history risks without surgery was discussed. I feel the risks of no intervention will lead to serious problems that outweigh the operative risks; therefore, I recommended a partial colectomy to remove the pathology. Minimally invasive (Robotic/Laparoscopic) & open techniques were discussed.  Risks such as bleeding, infection, abscess, leak, reoperation, possible ostomy, hernia, heart attack, death, and other risks were discussed. I noted a good likelihood this will help address the problem. Goals of post-operative recovery were discussed as well. Need for adequate nutrition, daily bowel regimen and healthy physical activity, to optimize recovery was noted as well. We will work to minimize complications. Educational materials were available as well. Questions were answered. The patient expresses understanding & wishes to proceed with surgery.  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 Improved on iron. Agree  with follow-up hemoglobin. We'll check with preoperative labs.  LEFT KIDNEY MASS (593.9  N28.89) Impression: Agree with MRI of abdomen to assess. Only remaining mass. Also it help look at adrenal nodule and pancreatic mass

## 2014-08-20 LAB — CEA: CEA: 1.2 ng/mL (ref 0.0–5.0)

## 2014-08-22 ENCOUNTER — Ambulatory Visit (HOSPITAL_COMMUNITY)
Admission: RE | Admit: 2014-08-22 | Discharge: 2014-08-22 | Disposition: A | Discharge status: Home / Self Care | Payer: BLUE CROSS/BLUE SHIELD | Source: Ambulatory Visit | Attending: Gastroenterology | Admitting: Gastroenterology

## 2014-08-22 ENCOUNTER — Ambulatory Visit (HOSPITAL_COMMUNITY)
Admission: RE | Admit: 2014-08-22 | Discharge: 2014-08-22 | Disposition: A | Discharge status: Home / Self Care | Payer: BLUE CROSS/BLUE SHIELD | Source: Ambulatory Visit | Attending: Surgery | Admitting: Surgery

## 2014-08-22 DIAGNOSIS — C189 Malignant neoplasm of colon, unspecified: Secondary | ICD-10-CM

## 2014-08-22 DIAGNOSIS — R935 Abnormal findings on diagnostic imaging of other abdominal regions, including retroperitoneum: Secondary | ICD-10-CM

## 2014-08-22 DIAGNOSIS — C187 Malignant neoplasm of sigmoid colon: Secondary | ICD-10-CM

## 2014-08-22 DIAGNOSIS — R938 Abnormal findings on diagnostic imaging of other specified body structures: Secondary | ICD-10-CM | POA: Insufficient documentation

## 2014-08-22 DIAGNOSIS — K760 Fatty (change of) liver, not elsewhere classified: Secondary | ICD-10-CM | POA: Insufficient documentation

## 2014-08-22 MED ORDER — GADOBENATE DIMEGLUMINE 529 MG/ML IV SOLN
15.0000 mL | Freq: Once | INTRAVENOUS | Status: AC | PRN
  Administered 2014-08-22: 15 mL via INTRAVENOUS

## 2014-08-22 MED ORDER — IOHEXOL 300 MG/ML  SOLN
80.0000 mL | Freq: Once | INTRAMUSCULAR | Status: AC | PRN
  Administered 2014-08-22: 80 mL via INTRAVENOUS

## 2014-08-24 ENCOUNTER — Encounter: Payer: Self-pay | Admitting: Family Medicine

## 2014-08-24 ENCOUNTER — Ambulatory Visit (INDEPENDENT_AMBULATORY_CARE_PROVIDER_SITE_OTHER): Payer: BLUE CROSS/BLUE SHIELD | Admitting: Family Medicine

## 2014-08-24 VITALS — BP 138/85 | HR 73 | Temp 98.5°F | Ht 64.75 in | Wt 178.0 lb

## 2014-08-24 DIAGNOSIS — C189 Malignant neoplasm of colon, unspecified: Secondary | ICD-10-CM

## 2014-08-24 NOTE — Progress Notes (Signed)
Pre visit review using our clinic review tool, if applicable. No additional management support is needed unless otherwise documented below in the visit note. 

## 2014-08-24 NOTE — Progress Notes (Signed)
OFFICE VISIT  08/24/2014   CC: Discuss recent dx of colon ca/upcoming surgery  HPI:    Patient is a 54 y.o. Caucasian female who presents for discussion of upcoming surgery with Dr. Johney Maine for colon cancer surgery scheduled 09/08/13. She has some reservations about Dr. Johney Maine being a good match for her.  She has already scheduled a 2nd opinion with a surgeon at [facility] and asks for records to be sent to her: Dr. Leo Rod.    Also, abd imaging showed some finding she wants to discuss.  Hepatic lesions not convincing for mets, some pancreatic cysts, and a left kidney complicated cystic mass.  She has already been set up for appropriate f/u imaging for the hepatic and pancreatic lesions, and has already been set up with a urologist for further evaluation of the renal mass. We reviewed these today in detail.  Past Medical History  Diagnosis Date  . HTN (hypertension)     since age 89.  Also hx of PIH.  Marland Kitchen Gout     Uric acid level decreased from 9 to 4.1 with addition of uloric 40 mg qd (02/2011).  Marland Kitchen History of nephrectomy, unilateral 1964    Wilms tumor at age 88 (left kidney)  . History of subacute thyroiditis   . History of anemia   . Postmenopausal   . History of blood transfusion 1963  . Chronic renal insufficiency, stage I     Stage I/II.  No proteinuria.  ARB added 12/2010 by nephrologist for renal protection  . Seborrheic dermatitis     Face; consider contact derm (around eyes); Lyndle Herrlich 09/2012--desonide 0.05% cream rx'd  . Colon cancer 08/2014    Past Surgical History  Procedure Laterality Date  . Appendectomy  1964  . Nephrectomy  1964    Wilms tumor (right)  . Lumbar laminectomy  2004    microdiscectomy  . Breast reduction surgery  2003    bilateral  . Cesarean section  1998  . Lumbar laminectomy/decompression microdiscectomy Left 05/21/2013    Procedure: LUMBAR LAMINECTOMY MICRODISCECTOMY L5-S1 LEFT ;  Surgeon: Tobi Bastos, MD;  Location: [facility];  Service:  Orthopedics;  Laterality: Left;  . Colonoscopy N/A 08/17/2014    Path; invasive adenocarcinoma. Procedure: COLONOSCOPY;  Surgeon: Ladene Artist, MD;  Location: [facility];  Service: Endoscopy;  Laterality: N/A;    Outpatient Prescriptions Prior to Visit  Medication Sig Dispense Refill  . acetaminophen (TYLENOL) 500 MG tablet Take 1,000 mg by mouth every 6 (six) hours as needed for pain.    Marland Kitchen colchicine 0.6 MG tablet Take 0.6 mg by mouth 2 (two) times daily as needed. for gout flare    . febuxostat (ULORIC) 40 MG tablet Take 40 mg by mouth every morning.     . ferrous sulfate 325 (65 FE) MG tablet Take 325 mg by mouth daily with breakfast.    . valACYclovir (VALTREX) 1000 MG tablet TAKE 2 TABLETS BY MOUTH EVERY 12 HOURS AS 2 DOSES (Patient taking differently: TAKE 2 TABLETS BY MOUTH EVERY 12 HOURS AS 2 DOSES prn) 4 tablet 10  . valsartan (DIOVAN) 160 MG tablet Take 160 mg by mouth every morning.     . Azelaic Acid (FINACEA) 15 % cream Apply 1 application topically 2 (two) times daily. After skin is thoroughly washed and patted dry, gently but thoroughly massage a thin film of azelaic acid cream into the affected area twice daily, in the morning and evening.    . peg 3350  powder (MOVIPREP) 100 G SOLR Take 1 kit (200 g total) by mouth once. (Patient not taking: Reported on 08/24/2014) 1 kit 0   No facility-administered medications prior to visit.    Allergies  Allergen Reactions  . Aldomet [Methyldopa]     Severe bone suppression  . Latex Rash    Blisters, rash  . Banana Itching  . Peach [Prunus Persica] Itching  . Thiazide-Type Diuretics Other (See Comments)    HCTZ increased gout flares  . Crab [Shellfish Allergy] Rash    ROS As per HPI  PE: Blood pressure 138/85, pulse 73, temperature 98.5 F (36.9 C), temperature source Temporal, height 5' 4.75" (1.645 m), weight 178 lb (80.74 kg), SpO2 100 %. Gen: Alert, well appearing.  Patient is oriented to person, place, time, and  situation. No further exam today.  LABS:  Lab Results  Component Value Date   WBC 7.1 08/01/2014   HGB 8.6 Repeated and verified X2.* 08/15/2014   HCT 28.2* 08/15/2014   MCV 70.9* 08/01/2014   PLT 392.0 08/01/2014     IMPRESSION AND PLAN:  Colon cancer, no evidence of local or distant metastases at this time. Spent 30 min with pt today, with >50% of this time spent in counseling and care coordination regarding the above problems. Reassured pt, encouraged pt, gave emotional support.  She has all the necessary specialists working with/for her. I agree with her decision to seek 2nd opinion surgically so she can possibly feel more at ease with the whole process--which is not the way Dr. Johney Maine made her feel apparently.  She made it clear she felt like he (Dr. Johney Maine) was competent, though. We had her sign the appropriate release/paperwork so we could get records to the surgeon at [facility] that she'll be seeing soon. We also decided to have her come back in 7d for recheck CBC in our lab.  She is continuing to take ferrous sulfate 325m tid.  An After Visit Summary was printed and given to the patient.  FOLLOW UP: prn

## 2014-08-25 NOTE — Progress Notes (Signed)
Per patient history, as a child was hospitalized at [facility] with cancer- her parents who are deceased now informed her that every time "they tried to give me blood, I had allergic reaction".  Patient reports she has called [facility] and was told they are many different ways of testing the blood and antibodies. I spoke with the blood bank here at [facility] and relayed this information to them along with pre op date and surgery date. Beth stated this would be plenty of time for testing. Patient notified

## 2014-08-31 ENCOUNTER — Other Ambulatory Visit (INDEPENDENT_AMBULATORY_CARE_PROVIDER_SITE_OTHER): Payer: BLUE CROSS/BLUE SHIELD

## 2014-08-31 DIAGNOSIS — D509 Iron deficiency anemia, unspecified: Secondary | ICD-10-CM

## 2014-08-31 LAB — CBC WITH DIFFERENTIAL/PLATELET
BASOS ABS: 0 10*3/uL (ref 0.0–0.1)
BASOS PCT: 0.8 % (ref 0.0–3.0)
EOS ABS: 0.1 10*3/uL (ref 0.0–0.7)
EOS PCT: 2.2 % (ref 0.0–5.0)
HEMATOCRIT: 31.2 % — AB (ref 36.0–46.0)
Hemoglobin: 9.9 g/dL — ABNORMAL LOW (ref 12.0–15.0)
LYMPHS ABS: 1.6 10*3/uL (ref 0.7–4.0)
Lymphocytes Relative: 28.9 % (ref 12.0–46.0)
MCHC: 31.6 g/dL (ref 30.0–36.0)
MCV: 78.8 fl (ref 78.0–100.0)
Monocytes Absolute: 0.5 10*3/uL (ref 0.1–1.0)
Monocytes Relative: 8.3 % (ref 3.0–12.0)
Neutro Abs: 3.3 10*3/uL (ref 1.4–7.7)
Neutrophils Relative %: 59.8 % (ref 43.0–77.0)
Platelets: 255 10*3/uL (ref 150.0–400.0)
RBC: 3.95 Mil/uL (ref 3.87–5.11)
RDW: 27.9 % — AB (ref 11.5–15.5)
WBC: 5.6 10*3/uL (ref 4.0–10.5)

## 2014-09-01 ENCOUNTER — Telehealth: Payer: Self-pay | Admitting: Gastroenterology

## 2014-09-01 NOTE — Telephone Encounter (Signed)
No answer-did not leave a message

## 2014-09-03 ENCOUNTER — Encounter: Payer: Self-pay | Admitting: Family Medicine

## 2014-09-05 ENCOUNTER — Inpatient Hospital Stay (HOSPITAL_COMMUNITY): Admission: RE | Admit: 2014-09-05 | Payer: BLUE CROSS/BLUE SHIELD | Source: Ambulatory Visit

## 2014-09-05 NOTE — Telephone Encounter (Signed)
Please thank her for letting us know. If she would give [place] the name of the surgeon and ask that they send [place] her operative report and pathology. Recall colonoscopy in 1 year.

## 2014-09-05 NOTE — Telephone Encounter (Signed)
Patient left a voicemail that she just wanted Dr. Fuller Plan to know she will have her surgery at [facility] not here in [place]

## 2014-09-05 NOTE — Telephone Encounter (Signed)
Left message for patient to call back  

## 2014-09-05 NOTE — Telephone Encounter (Signed)
Recall entered [name] is the surgeon.

## 2014-09-08 ENCOUNTER — Inpatient Hospital Stay (HOSPITAL_COMMUNITY): Admission: RE | Admit: 2014-09-08 | Payer: BLUE CROSS/BLUE SHIELD | Source: Ambulatory Visit | Admitting: Surgery

## 2014-09-08 ENCOUNTER — Encounter (HOSPITAL_COMMUNITY): Admission: RE | Payer: Self-pay | Source: Ambulatory Visit

## 2014-09-08 SURGERY — COLECTOMY, PARTIAL, ROBOT-ASSISTED, LAPAROSCOPIC
Anesthesia: General

## 2014-09-12 HISTORY — PX: COLON SURGERY: SHX602

## 2014-09-12 HISTORY — PX: OTHER SURGICAL HISTORY: SHX169

## 2014-09-26 ENCOUNTER — Encounter: Payer: Self-pay | Admitting: Family Medicine

## 2014-10-12 ENCOUNTER — Encounter (HOSPITAL_COMMUNITY): Payer: Self-pay | Admitting: Gastroenterology

## 2014-11-22 ENCOUNTER — Other Ambulatory Visit: Payer: Self-pay

## 2014-11-22 ENCOUNTER — Telehealth: Payer: Self-pay

## 2014-11-22 DIAGNOSIS — K869 Disease of pancreas, unspecified: Secondary | ICD-10-CM

## 2014-11-22 NOTE — Telephone Encounter (Signed)
Patient is scheduled for a MRI abdomen at [facility] with pancreatic protocol for 12/15/14 10:00.  She is asked to arrive at 9:45 and be NPO for 4 hours prior. Patient asked about adding on a urology protocol to look at the lesion in the Kidney.  She is advised that she will need to speak with her Urologist about what specific test he would want to order.  She is advised that potentially they could add on to the order we have if the tests he would want are compatible. She will contact her Urologist. She verbalized understanding of instructions for the MRI

## 2014-11-22 NOTE — Telephone Encounter (Signed)
-----   Message from Kellie Moor, RN sent at 08/23/2014  1:42 PM EST ----- Patient needs MRI see results from 08/23/14 - stark

## 2014-12-07 ENCOUNTER — Telehealth: Payer: Self-pay | Admitting: Gastroenterology

## 2014-12-08 NOTE — Telephone Encounter (Signed)
Patient notified that it has been added

## 2014-12-08 NOTE — Telephone Encounter (Signed)
OK to add kidneys and liver to follow up on lesions in both those areas in addition to panc protocol. mrcp not needed at this time.

## 2014-12-08 NOTE — Telephone Encounter (Signed)
I spoke with Peggy at [facility] scheduling and she will make sure that the order is for MRI pancreatic protocol, liver and kidneys

## 2014-12-08 NOTE — Telephone Encounter (Signed)
Patient provided the number to central scheduling to reschedule/  

## 2014-12-08 NOTE — Telephone Encounter (Signed)
Dr. Fuller Plan we received a letter today from Dr. Alinda Money from [facility] (in your office), he is asking if we can add on to the MRI abdomen with pancreatic protocol you have ordered.  He wants to make sure that the kidneys are seen to follow up a Bosniak III renal cystic lesion.  I spoke with radiology and they said if we put in the comments to include the kidney they will do it.  Is it ok to order this.  They said that as long as you don't need to look at the pancreatic ducts they can do this from your order.  Please review that I have ordered what you want and that she does not need an MRCP but just a pancreatic protocol.

## 2014-12-15 ENCOUNTER — Ambulatory Visit (HOSPITAL_COMMUNITY): Payer: BLUE CROSS/BLUE SHIELD

## 2014-12-21 ENCOUNTER — Ambulatory Visit (HOSPITAL_COMMUNITY)
Admission: RE | Admit: 2014-12-21 | Discharge: 2014-12-21 | Disposition: A | Discharge status: Home / Self Care | Payer: BLUE CROSS/BLUE SHIELD | Source: Ambulatory Visit | Attending: Gastroenterology | Admitting: Gastroenterology

## 2014-12-21 DIAGNOSIS — Z85038 Personal history of other malignant neoplasm of large intestine: Secondary | ICD-10-CM | POA: Diagnosis not present

## 2014-12-21 DIAGNOSIS — K869 Disease of pancreas, unspecified: Secondary | ICD-10-CM

## 2014-12-21 DIAGNOSIS — K760 Fatty (change of) liver, not elsewhere classified: Secondary | ICD-10-CM | POA: Insufficient documentation

## 2014-12-21 DIAGNOSIS — D1771 Benign lipomatous neoplasm of kidney: Secondary | ICD-10-CM | POA: Insufficient documentation

## 2014-12-21 DIAGNOSIS — Z905 Acquired absence of kidney: Secondary | ICD-10-CM | POA: Diagnosis not present

## 2014-12-21 DIAGNOSIS — Z85528 Personal history of other malignant neoplasm of kidney: Secondary | ICD-10-CM | POA: Diagnosis not present

## 2014-12-21 LAB — POCT I-STAT CREATININE: CREATININE: 1.1 mg/dL — AB (ref 0.44–1.00)

## 2014-12-26 ENCOUNTER — Telehealth: Payer: Self-pay | Admitting: Family Medicine

## 2014-12-26 NOTE — Telephone Encounter (Signed)
Postponed mammogram.  Pt is currently going through colon cancer.

## 2015-02-05 ENCOUNTER — Encounter: Payer: Self-pay | Admitting: Family Medicine

## 2015-05-30 ENCOUNTER — Other Ambulatory Visit: Payer: Self-pay | Admitting: *Deleted

## 2015-05-30 MED ORDER — VALACYCLOVIR HCL 1 G PO TABS: ORAL_TABLET | ORAL | Status: DC

## 2015-05-30 NOTE — Telephone Encounter (Signed)
RF request for valacyclovir LOV: 08/01/14 Next ov: None Last written: 01/19/14 #4 w/ 8RF  Dr. Anitra Lauth is off the week. Please advise. Thanks.

## 2015-06-30 ENCOUNTER — Ambulatory Visit (INDEPENDENT_AMBULATORY_CARE_PROVIDER_SITE_OTHER): Payer: BLUE CROSS/BLUE SHIELD

## 2015-06-30 DIAGNOSIS — Z23 Encounter for immunization: Secondary | ICD-10-CM

## 2015-07-09 ENCOUNTER — Encounter: Payer: Self-pay | Admitting: Family Medicine

## 2015-07-11 ENCOUNTER — Telehealth: Payer: Self-pay | Admitting: Gastroenterology

## 2015-07-11 NOTE — Telephone Encounter (Signed)
Patient is due for MRI of the abdomen in May of 2017.  I left her a detailed message that no plan for a CT and that I will contact her in may to schedule follow up MRI.  She is asked to call back for any additional questions or concerns.

## 2015-07-13 ENCOUNTER — Other Ambulatory Visit (HOSPITAL_COMMUNITY): Payer: Self-pay | Admitting: Internal Medicine

## 2015-07-13 DIAGNOSIS — C641 Malignant neoplasm of right kidney, except renal pelvis: Secondary | ICD-10-CM

## 2015-07-13 DIAGNOSIS — C19 Malignant neoplasm of rectosigmoid junction: Secondary | ICD-10-CM

## 2015-07-28 ENCOUNTER — Ambulatory Visit (HOSPITAL_COMMUNITY): Payer: BLUE CROSS/BLUE SHIELD

## 2015-07-28 ENCOUNTER — Ambulatory Visit (HOSPITAL_COMMUNITY): Admission: RE | Admit: 2015-07-28 | Payer: BLUE CROSS/BLUE SHIELD | Source: Ambulatory Visit

## 2015-08-10 ENCOUNTER — Ambulatory Visit (HOSPITAL_COMMUNITY): Admission: RE | Admit: 2015-08-10 | Payer: BLUE CROSS/BLUE SHIELD | Source: Ambulatory Visit

## 2015-08-10 ENCOUNTER — Ambulatory Visit (HOSPITAL_COMMUNITY): Payer: BLUE CROSS/BLUE SHIELD

## 2015-08-13 DIAGNOSIS — M25511 Pain in right shoulder: Secondary | ICD-10-CM

## 2015-08-13 HISTORY — DX: Pain in right shoulder: M25.511

## 2015-08-23 ENCOUNTER — Other Ambulatory Visit: Payer: Self-pay

## 2015-08-23 ENCOUNTER — Ambulatory Visit (HOSPITAL_COMMUNITY)
Admission: RE | Admit: 2015-08-23 | Discharge: 2015-08-23 | Disposition: A | Discharge status: Home / Self Care | Payer: BLUE CROSS/BLUE SHIELD | Source: Ambulatory Visit | Attending: Internal Medicine | Admitting: Internal Medicine

## 2015-08-23 DIAGNOSIS — D1771 Benign lipomatous neoplasm of kidney: Secondary | ICD-10-CM | POA: Insufficient documentation

## 2015-08-23 DIAGNOSIS — C641 Malignant neoplasm of right kidney, except renal pelvis: Secondary | ICD-10-CM

## 2015-08-23 DIAGNOSIS — C19 Malignant neoplasm of rectosigmoid junction: Secondary | ICD-10-CM

## 2015-08-23 DIAGNOSIS — K869 Disease of pancreas, unspecified: Secondary | ICD-10-CM | POA: Insufficient documentation

## 2015-08-23 LAB — POCT PREGNANCY, URINE: Preg Test, Ur: NEGATIVE

## 2015-08-23 LAB — POCT I-STAT CREATININE: CREATININE: 1 mg/dL (ref 0.44–1.00)

## 2015-08-23 MED ORDER — GADOBENATE DIMEGLUMINE 529 MG/ML IV SOLN
20.0000 mL | Freq: Once | INTRAVENOUS | Status: AC | PRN
  Administered 2015-08-23: 16 mL via INTRAVENOUS

## 2015-09-04 ENCOUNTER — Encounter: Payer: Self-pay | Admitting: Gastroenterology

## 2015-09-10 ENCOUNTER — Encounter: Payer: Self-pay | Admitting: Family Medicine

## 2015-09-14 ENCOUNTER — Encounter: Payer: Self-pay | Admitting: Family Medicine

## 2015-09-14 ENCOUNTER — Encounter: Payer: Self-pay | Admitting: Gastroenterology

## 2015-10-05 ENCOUNTER — Encounter: Payer: Self-pay | Admitting: Family Medicine

## 2015-10-21 ENCOUNTER — Encounter: Payer: Self-pay | Admitting: Family Medicine

## 2015-10-27 ENCOUNTER — Ambulatory Visit (AMBULATORY_SURGERY_CENTER): Payer: Self-pay

## 2015-10-27 VITALS — Ht 65.0 in | Wt 184.0 lb

## 2015-10-27 DIAGNOSIS — Z85038 Personal history of other malignant neoplasm of large intestine: Secondary | ICD-10-CM

## 2015-10-27 MED ORDER — NA SULFATE-K SULFATE-MG SULF 17.5-3.13-1.6 GM/177ML PO SOLN
1.0000 | Freq: Once | ORAL | Status: DC
Start: 2015-10-27 — End: 2015-11-16

## 2015-10-27 NOTE — Progress Notes (Signed)
No egg or soy allergies Not on home 02 No previous anesthesia complications No diet or weight loss meds 

## 2015-11-15 ENCOUNTER — Telehealth: Payer: Self-pay | Admitting: Gastroenterology

## 2015-11-15 NOTE — Telephone Encounter (Signed)
Called pt and discussed concerns.  Elaine Jenkins/PV

## 2015-11-15 NOTE — Telephone Encounter (Signed)
patient states that she isn't sure if she will be able to drink 16oz after drinking the prep. she is wanting to know if it will be ok if she didnt drink all 16oz of water. Also, patient states that she is on Diovan and has questions regarding that and the procedure.

## 2015-11-16 ENCOUNTER — Ambulatory Visit (AMBULATORY_SURGERY_CENTER): Payer: BLUE CROSS/BLUE SHIELD | Admitting: Gastroenterology

## 2015-11-16 ENCOUNTER — Encounter: Payer: Self-pay | Admitting: Gastroenterology

## 2015-11-16 VITALS — BP 119/74 | HR 76 | Temp 97.6°F | Resp 12 | Ht 65.0 in | Wt 184.0 lb

## 2015-11-16 DIAGNOSIS — D125 Benign neoplasm of sigmoid colon: Secondary | ICD-10-CM | POA: Diagnosis not present

## 2015-11-16 DIAGNOSIS — D126 Benign neoplasm of colon, unspecified: Secondary | ICD-10-CM | POA: Diagnosis not present

## 2015-11-16 DIAGNOSIS — D124 Benign neoplasm of descending colon: Secondary | ICD-10-CM | POA: Diagnosis not present

## 2015-11-16 DIAGNOSIS — D122 Benign neoplasm of ascending colon: Secondary | ICD-10-CM

## 2015-11-16 DIAGNOSIS — D123 Benign neoplasm of transverse colon: Secondary | ICD-10-CM | POA: Diagnosis not present

## 2015-11-16 DIAGNOSIS — Z1211 Encounter for screening for malignant neoplasm of colon: Secondary | ICD-10-CM

## 2015-11-16 DIAGNOSIS — Z85038 Personal history of other malignant neoplasm of large intestine: Secondary | ICD-10-CM | POA: Diagnosis not present

## 2015-11-16 DIAGNOSIS — D120 Benign neoplasm of cecum: Secondary | ICD-10-CM | POA: Diagnosis not present

## 2015-11-16 MED ORDER — SODIUM CHLORIDE 0.9 % IV SOLN: 500.0000 mL | INTRAVENOUS | Status: DC

## 2015-11-16 NOTE — Patient Instructions (Signed)
FOLLOW DISCHARGE INSTRUCTIONS (BLUE AND GREEN SHEETS).YOU HAD AN ENDOSCOPIC PROCEDURE TODAY AT [facility]:   Refer to the procedure report that was given to you for any specific questions about what was found during the examination.  If the procedure report does not answer your questions, please call your gastroenterologist to clarify.  If you requested that your care partner not be given the details of your procedure findings, then the procedure report has been included in a sealed envelope for you to review at your convenience later.  YOU SHOULD EXPECT: Some feelings of bloating in the abdomen. Passage of more gas than usual.  Walking can help get rid of the air that was put into your GI tract during the procedure and reduce the bloating. If you had a lower endoscopy (such as a colonoscopy or flexible sigmoidoscopy) you may notice spotting of blood in your stool or on the toilet paper. If you underwent a bowel prep for your procedure, you may not have a normal bowel movement for a few days.  Please Note:  You might notice some irritation and congestion in your nose or some drainage.  This is from the oxygen used during your procedure.  There is no need for concern and it should clear up in a day or so.  SYMPTOMS TO REPORT IMMEDIATELY:   Following lower endoscopy (colonoscopy or flexible sigmoidoscopy):  Excessive amounts of blood in the stool  Significant tenderness or worsening of abdominal pains  Swelling of the abdomen that is new, acute  Fever of 100F or higher   For urgent or emergent issues, a gastroenterologist can be reached at any hour by calling [phone].   DIET: Your first meal following the procedure should be a small meal and then it is ok to progress to your normal diet. Heavy or fried foods are harder to digest and may make you feel nauseous or bloated.  Likewise, meals heavy in dairy and vegetables can increase bloating.  Drink plenty of fluids but you  should avoid alcoholic beverages for 24 hours.  ACTIVITY:  You should plan to take it easy for the rest of today and you should NOT DRIVE or use heavy machinery until tomorrow (because of the sedation medicines used during the test).    FOLLOW UP: Our staff will call the number listed on your records the next business day following your procedure to check on you and address any questions or concerns that you may have regarding the information given to you following your procedure. If we do not reach you, we will leave a message.  However, if you are feeling well and you are not experiencing any problems, there is no need to return our call.  We will assume that you have returned to your regular daily activities without incident.  If any biopsies were taken you will be contacted by phone or by letter within the next 1-3 weeks.  Please call us at [phone] if you have not heard about the biopsies in 3 weeks.    SIGNATURES/CONFIDENTIALITY: You and/or your care partner have signed paperwork which will be entered into your electronic medical record.  These signatures attest to the fact that that the information above on your After Visit Summary has been reviewed and is understood.  Full responsibility of the confidentiality of this discharge information lies with you and/or your care-partner.  Internal hemorrhoid information given  Polyp information given  Repeat colonoscopy 1 year-2018

## 2015-11-16 NOTE — Progress Notes (Signed)
Called to room to assist during endoscopic procedure.  Patient ID and intended procedure confirmed with present staff. Received instructions for my participation in the procedure from the performing physician.  

## 2015-11-16 NOTE — Progress Notes (Signed)
A/ox3 pleased with MAC, report to Jane RN 

## 2015-11-16 NOTE — Op Note (Signed)
[facility] Patient Name: Elaine Jenkins Procedure Date: 11/16/2015 8:01 AM MRN: [id number] Endoscopist: Ladene Artist , MD Age: 55 Date of Birth: Jun 08, 1961 Gender: Female Procedure:                Colonoscopy Indications:              High risk colon cancer surveillance: Personal                            history of colon cancer Medicines:                Monitored Anesthesia Care Procedure:                Pre-Anesthesia Assessment:                           - Prior to the procedure, a History and Physical                            was performed, and patient medications and                            allergies were reviewed. The patient's tolerance of                            previous anesthesia was also reviewed. The risks                            and benefits of the procedure and the sedation                            options and risks were discussed with the patient.                            All questions were answered, and informed consent                            was obtained. Prior Anticoagulants: The patient has                            taken no previous anticoagulant or antiplatelet                            agents. ASA Grade Assessment: II - A patient with                            mild systemic disease. After reviewing the risks                            and benefits, the patient was deemed in                            satisfactory condition to undergo the procedure.  After obtaining informed consent, the colonoscope                            was passed under direct vision. Throughout the                            procedure, the patient's blood pressure, pulse, and                            oxygen saturations were monitored continuously. The                            Model PCF-[id number]) scope was introduced                            through the anus and advanced to the the cecum,   identified by appendiceal orifice and ileocecal                            valve. The colonoscopy was performed without                            difficulty. The patient tolerated the procedure                            well. The quality of the bowel preparation was                            excellent. The ileocecal valve, appendiceal                            orifice, and rectum were photographed. Scope In: 8:14:09 AM Scope Out: 8:32:38 AM Scope Withdrawal Time: 0 hours 17 minutes 21 seconds  Total Procedure Duration: 0 hours 18 minutes 29 seconds  Findings:                 The digital rectal exam was normal.                           Six sessile polyps were found in the sigmoid colon                            (1), descending colon (4) and ileocecal valve (1).                            The polyps were 6 to 7 mm in size. These polyps                            were removed with a cold snare. Resection and                            retrieval were complete.                           Five  sessile polyps were found in the transverse                            colon (2), ascending colon (1) and cecum (2). The                            polyps were 4 to 5 mm in size. These polyps were                            removed with a cold biopsy forceps. Resection and                            retrieval were complete.                           There was evidence of a prior end-to-end                            colo-colonic anastomosis in the sigmoid colon. This                            was patent and was characterized by healthy                            appearing mucosa.                           Internal hemorrhoids were found during                            retroflexion. The hemorrhoids were small and Grade                            I (internal hemorrhoids that do not prolapse).                           The exam was otherwise normal throughout the                            examined  colon.                           No additional abnormalities were found on                            retroflexion. Complications:            No immediate complications. Estimated Blood Loss:     Estimated blood loss was minimal. Impression:               - Six 6 to 7 mm polyps in the sigmoid colon, in the                            descending colon and at the ileocecal valve,  removed with a cold snare. Resected and retrieved.                           - Five 4 to 5 mm polyps in the transverse colon, in                            the ascending colon and in the cecum, removed with                            a cold biopsy forceps. Resected and retrieved.                           - Patent end-to-end colo-colonic anastomosis,                            characterized by healthy appearing mucosa.                           - Internal hemorrhoids. Recommendation:           - Patient has a contact number available for                            emergencies. The signs and symptoms of potential                            delayed complications were discussed with the                            patient. Return to normal activities tomorrow.                            Written discharge instructions were provided to the                            patient.                           - Resume previous diet.                           - Continue present medications.                           - Await pathology results.                           - Repeat colonoscopy in 1 year for surveillance. Ladene Artist, MD 11/16/2015 8:43:15 AM This report has been signed electronically.

## 2015-11-17 NOTE — Telephone Encounter (Signed)
  Follow up Call-  Call back number 11/16/2015  Post procedure Call Back phone  # [phone]  Permission to leave phone message Yes     Patient questions:  Do you have a fever, pain , or abdominal swelling? No. Pain Score  0 *  Have you tolerated food without any problems? No.  Have you been able to return to your normal activities? Yes.    Do you have any questions about your discharge instructions: Diet   No. Medications  No. Follow up visit  No.  Do you have questions or concerns about your Care? No.  Actions: * If pain score is 4 or above: No action needed, pain <4.

## 2015-11-22 ENCOUNTER — Encounter: Payer: Self-pay | Admitting: Gastroenterology

## 2015-11-28 ENCOUNTER — Telehealth: Payer: Self-pay | Admitting: Gastroenterology

## 2015-11-28 NOTE — Telephone Encounter (Signed)
Patient advised that I will fax pathology, procedure report, and letter to Dr. Nelda Marseille at [facility] [phone]. Patient will call back for any additional questions or concerns

## 2015-11-28 NOTE — Telephone Encounter (Signed)
patient is wanting to talk to a nurse regarding path results. She states that she has an appointment with her oncologist to have port removed on Friday and is wanting to know if she should continue with that plan. she also wants results sent to her oncologist. Dr. Karle Starch P: [phone]

## 2015-12-11 ENCOUNTER — Telehealth: Payer: Self-pay

## 2015-12-11 NOTE — Telephone Encounter (Signed)
Per MRI report from 12/22/2014 patient was to have a follow up MRI to eval abnormal pancreas. Reviewed by Dr. Fuller Plan today Patient had an MRI 08/23/15 and this was reviewed by Dr. Fuller Plan.  Lesions were stable and Dr. Lanelle Bal wants a repeat MRI abd one year from last MRI 08/23/15. Reminder sent to myself.

## 2015-12-11 NOTE — Telephone Encounter (Signed)
-----   Message from Marlon Pel, RN sent at 12/22/2014  3:12 PM EDT ----- Needs follow up MRI in 1 year- see results from 12/22/14 Elaine Jenkins patient

## 2016-01-04 ENCOUNTER — Other Ambulatory Visit: Payer: Self-pay | Admitting: *Deleted

## 2016-01-04 MED ORDER — VALACYCLOVIR HCL 1 G PO TABS: ORAL_TABLET | ORAL | Status: DC

## 2016-01-04 NOTE — Telephone Encounter (Signed)
RF request for valacyclovir LOV: 08/24/14 Next ov: None Last written: 05/30/15 #4 w/ 0RF  Please advise.Thanks.

## 2016-02-08 ENCOUNTER — Other Ambulatory Visit (HOSPITAL_COMMUNITY): Payer: Self-pay | Admitting: Orthopedic Surgery

## 2016-02-08 DIAGNOSIS — C19 Malignant neoplasm of rectosigmoid junction: Secondary | ICD-10-CM

## 2016-02-09 ENCOUNTER — Other Ambulatory Visit (HOSPITAL_COMMUNITY): Payer: Self-pay | Admitting: Internal Medicine

## 2016-02-09 DIAGNOSIS — C19 Malignant neoplasm of rectosigmoid junction: Secondary | ICD-10-CM

## 2016-02-23 ENCOUNTER — Ambulatory Visit (HOSPITAL_COMMUNITY)
Admission: RE | Admit: 2016-02-23 | Discharge: 2016-02-23 | Disposition: A | Discharge status: Home / Self Care | Payer: BLUE CROSS/BLUE SHIELD | Source: Ambulatory Visit | Attending: Orthopedic Surgery | Admitting: Orthopedic Surgery

## 2016-02-23 DIAGNOSIS — N289 Disorder of kidney and ureter, unspecified: Secondary | ICD-10-CM | POA: Diagnosis not present

## 2016-02-23 DIAGNOSIS — K869 Disease of pancreas, unspecified: Secondary | ICD-10-CM | POA: Diagnosis not present

## 2016-02-23 DIAGNOSIS — K760 Fatty (change of) liver, not elsewhere classified: Secondary | ICD-10-CM | POA: Diagnosis not present

## 2016-02-23 DIAGNOSIS — K862 Cyst of pancreas: Secondary | ICD-10-CM | POA: Insufficient documentation

## 2016-02-23 DIAGNOSIS — C19 Malignant neoplasm of rectosigmoid junction: Secondary | ICD-10-CM | POA: Diagnosis present

## 2016-02-23 DIAGNOSIS — D1771 Benign lipomatous neoplasm of kidney: Secondary | ICD-10-CM | POA: Insufficient documentation

## 2016-02-23 LAB — POCT I-STAT CREATININE: CREATININE: 1.1 mg/dL — AB (ref 0.44–1.00)

## 2016-02-23 MED ORDER — GADOBENATE DIMEGLUMINE 529 MG/ML IV SOLN
17.0000 mL | Freq: Once | INTRAVENOUS | Status: AC | PRN
  Administered 2016-02-23: 17 mL via INTRAVENOUS

## 2016-03-19 ENCOUNTER — Encounter: Payer: Self-pay | Admitting: Family Medicine

## 2016-04-17 ENCOUNTER — Encounter: Payer: Self-pay | Admitting: Family Medicine

## 2016-05-27 ENCOUNTER — Encounter: Payer: Self-pay | Admitting: Family Medicine

## 2016-05-29 ENCOUNTER — Ambulatory Visit: Payer: BLUE CROSS/BLUE SHIELD

## 2016-05-30 ENCOUNTER — Ambulatory Visit: Payer: BLUE CROSS/BLUE SHIELD

## 2016-06-03 ENCOUNTER — Ambulatory Visit (INDEPENDENT_AMBULATORY_CARE_PROVIDER_SITE_OTHER): Payer: BLUE CROSS/BLUE SHIELD

## 2016-06-03 DIAGNOSIS — Z23 Encounter for immunization: Secondary | ICD-10-CM

## 2016-06-28 ENCOUNTER — Encounter: Payer: Self-pay | Admitting: Family Medicine

## 2016-07-29 ENCOUNTER — Other Ambulatory Visit: Payer: Self-pay | Admitting: Pulmonary Disease

## 2016-07-29 DIAGNOSIS — C189 Malignant neoplasm of colon, unspecified: Secondary | ICD-10-CM

## 2016-08-09 ENCOUNTER — Other Ambulatory Visit: Payer: Self-pay | Admitting: Pulmonary Disease

## 2016-08-09 ENCOUNTER — Ambulatory Visit (HOSPITAL_COMMUNITY)
Admission: RE | Admit: 2016-08-09 | Discharge: 2016-08-09 | Disposition: A | Discharge status: Home / Self Care | Payer: BLUE CROSS/BLUE SHIELD | Source: Ambulatory Visit | Attending: Pulmonary Disease | Admitting: Pulmonary Disease

## 2016-08-09 DIAGNOSIS — C189 Malignant neoplasm of colon, unspecified: Secondary | ICD-10-CM

## 2016-08-11 ENCOUNTER — Ambulatory Visit (HOSPITAL_COMMUNITY)
Admission: RE | Admit: 2016-08-11 | Discharge: 2016-08-11 | Disposition: A | Discharge status: Home / Self Care | Payer: BLUE CROSS/BLUE SHIELD | Source: Ambulatory Visit | Attending: Pulmonary Disease | Admitting: Pulmonary Disease

## 2016-08-11 ENCOUNTER — Ambulatory Visit (HOSPITAL_COMMUNITY): Payer: BLUE CROSS/BLUE SHIELD

## 2016-08-11 DIAGNOSIS — K862 Cyst of pancreas: Secondary | ICD-10-CM | POA: Diagnosis not present

## 2016-08-11 DIAGNOSIS — N289 Disorder of kidney and ureter, unspecified: Secondary | ICD-10-CM | POA: Diagnosis not present

## 2016-08-11 DIAGNOSIS — C189 Malignant neoplasm of colon, unspecified: Secondary | ICD-10-CM | POA: Insufficient documentation

## 2016-08-11 LAB — POCT I-STAT CREATININE: CREATININE: 1.3 mg/dL — AB (ref 0.44–1.00)

## 2016-08-11 MED ORDER — GADOBENATE DIMEGLUMINE 529 MG/ML IV SOLN
17.0000 mL | Freq: Once | INTRAVENOUS | Status: AC | PRN
  Administered 2016-08-11: 6 mL via INTRAVENOUS

## 2016-08-13 ENCOUNTER — Other Ambulatory Visit: Payer: Self-pay | Admitting: Pulmonary Disease

## 2016-08-14 ENCOUNTER — Telehealth: Payer: Self-pay

## 2016-08-14 NOTE — Telephone Encounter (Signed)
-----   Message from Marlon Pel, RN sent at 12/11/2015  5:08 PM EDT ----- Needs MRI see phone note from 12/11/15- Fuller Plan

## 2016-08-14 NOTE — Telephone Encounter (Signed)
Patient was due for MRI for follow up.  Please see recent MRI from [facility] under imaging results for your review

## 2016-08-14 NOTE — Telephone Encounter (Signed)
Reviewed the report of the abd/pelvic MRI dated 08/11/16 under imaging. Pancreatic cysts are stable suggesting a benign etiology.  Repeat abd MRI in 6 months however this might be already scheduled by her oncologist who can follow this problem.

## 2016-08-27 ENCOUNTER — Other Ambulatory Visit: Payer: Self-pay | Admitting: *Deleted

## 2016-08-27 MED ORDER — FEBUXOSTAT 40 MG PO TABS: 40.0000 mg | ORAL_TABLET | Freq: Every morning | ORAL | 0 refills | Status: DC

## 2016-08-27 MED ORDER — VALSARTAN 160 MG PO TABS: 160.0000 mg | ORAL_TABLET | Freq: Every morning | ORAL | 0 refills | Status: DC

## 2016-08-27 NOTE — Telephone Encounter (Signed)
Fax from [facility].  RF request for valsartan LOV: 08/01/14 Next ov: None Last written: unknown  RF request for uloric Last written: unknown   Per Dr. Anitra Lauth okay for fill for #30 w/ 0Rf. Pt needs office visit for f/u [facility] for more refills.   Pt advised and voiced understanding.  Apt made for 10/09/16 at 3:00pm.

## 2016-09-20 ENCOUNTER — Encounter: Payer: Self-pay | Admitting: Gastroenterology

## 2016-09-26 ENCOUNTER — Encounter: Payer: Self-pay | Admitting: Gastroenterology

## 2016-09-28 ENCOUNTER — Other Ambulatory Visit: Payer: Self-pay | Admitting: Family Medicine

## 2016-10-09 ENCOUNTER — Encounter: Payer: Self-pay | Admitting: Family Medicine

## 2016-10-09 ENCOUNTER — Ambulatory Visit (INDEPENDENT_AMBULATORY_CARE_PROVIDER_SITE_OTHER): Payer: BLUE CROSS/BLUE SHIELD | Admitting: Family Medicine

## 2016-10-09 VITALS — BP 109/77 | HR 87 | Temp 98.3°F | Resp 16 | Ht 64.75 in | Wt 192.8 lb

## 2016-10-09 DIAGNOSIS — Z85038 Personal history of other malignant neoplasm of large intestine: Secondary | ICD-10-CM

## 2016-10-09 DIAGNOSIS — N183 Chronic kidney disease, stage 3 unspecified: Secondary | ICD-10-CM

## 2016-10-09 DIAGNOSIS — I10 Essential (primary) hypertension: Secondary | ICD-10-CM

## 2016-10-09 DIAGNOSIS — M1000 Idiopathic gout, unspecified site: Secondary | ICD-10-CM

## 2016-10-09 MED ORDER — ONDANSETRON HCL 4 MG PO TABS: 4.0000 mg | ORAL_TABLET | Freq: Three times a day (TID) | ORAL | 2 refills | Status: DC | PRN

## 2016-10-09 MED ORDER — COLCHICINE 0.6 MG PO TABS: 0.6000 mg | ORAL_TABLET | Freq: Two times a day (BID) | ORAL | 2 refills | Status: DC | PRN

## 2016-10-09 MED ORDER — VALACYCLOVIR HCL 1 G PO TABS: ORAL_TABLET | ORAL | 6 refills | Status: DC

## 2016-10-09 MED ORDER — FEBUXOSTAT 40 MG PO TABS: 40.0000 mg | ORAL_TABLET | Freq: Every morning | ORAL | 11 refills | Status: DC

## 2016-10-09 MED ORDER — VALSARTAN 160 MG PO TABS: 160.0000 mg | ORAL_TABLET | Freq: Every morning | ORAL | 11 refills | Status: DC

## 2016-10-09 NOTE — Progress Notes (Signed)
Pre visit review using our clinic review tool, if applicable. No additional management support is needed unless otherwise documented below in the visit note. 

## 2016-10-09 NOTE — Progress Notes (Signed)
OFFICE VISIT  10/12/2016   CC:  Chief Complaint  Patient presents with  . Follow-up    [facility], pt is not fasting.    HPI:    Patient is a 56 y.o. Caucasian female who presents for follow up gout, HTN, CRI stage III. Says no gout flares in a very long time. All bp checks at various MD visits have been normal but she does not monitor this at home.  Takes valsartan daily.  No gout flares in at least > 1 yr.    Cyst on kidney: followed by urol and has been stable.  Colon ca: recent MRI abd/pelv 08/2016 showed no sign of residual cancer.  However, her CEA was found to be slightly elevated 3.7, then 4.7 at repeat.  Got another recheck of CEA yesterday and is awaiting result. If it is up again, her oncologist wants her to do PET scan.  Pt has recent labs done at [facility] on her phone for me to see today: Labs 09/10/16 showed normal CBC and BMET normal and stable Cr at 1.05 (GFR 54).  She reports feeling good, started exercising recently.  Has R frozen shoulder that Dr. Mardelle Matte is following her for.  MRI showed no obvious tear.  PT helping some.  Past Medical History:  Diagnosis Date  . Allergy   . Anemia   . Cancer ([place])    kidney ca  . Chronic renal insufficiency, stage I    Stage II/III (GFR 50s-60s).  No proteinuria.  ARB added 12/2010 by nephrologist for renal protection  . Colon cancer ([place]) 09/12/14   Low anterior resection of rectum-laparoscopic, + chemo X about 8 cycles with dose reductions for toxicity.  As of 09/2015 f/u with colorectal surgeon and 10/2015 f/u with oncologist she was doing well.  . Gout    Uric acid level decreased from 9 to 4.1 with addition of uloric 40 mg qd (02/2011).  Marland Kitchen History of anemia   . History of blood transfusion 1963  . 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 History of subacute thyroiditis   . HTN (hypertension)    since age 5.  Also hx of PIH.  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  . Pancreatic cyst 2016/17   Stable and benign-appearing on f/u imaging as of 08/2015--repeat MRI planned for 02/2016 per onc  . Postmenopausal   . Right shoulder pain 2017   Dr. Mardelle Matte: pt did well with injection and PT.  Adhesive capsulitis vs cuff pathology: another injection by Dr. Mardelle Matte 05/20/16 helped x 2 wks.  MRI is next step: pt is considering this.  . Seborrheic dermatitis    Face; consider contact derm (around eyes); Lyndle Herrlich 09/2012--desonide 0.05% cream rx'd  . Solitary pulmonary nodule 2016/17   Stable on f/u CT imaging as of 10/2015---next ct to be 04/2016 per onc    Past Surgical History:  Procedure Laterality Date  . APPENDECTOMY  1964  . BREAST REDUCTION SURGERY  2003   bilateral  . [place]  . COLON SURGERY  09/2014   [facility]  . COLONOSCOPY N/A 08/17/2014   Procedure: COLONOSCOPY;  Surgeon: Ladene Artist, MD;  Location: [facility];  Service: Endoscopy;  Laterality: N/A;  . low anter resect rectum  09/12/14  for colon cancer; [facility], [name]  . LUMBAR LAMINECTOMY  2004   microdiscectomy  . LUMBAR LAMINECTOMY/DECOMPRESSION MICRODISCECTOMY Left 05/21/2013   Procedure: LUMBAR LAMINECTOMY MICRODISCECTOMY L5-S1 LEFT ;  Surgeon: Tobi Bastos, MD;  Location: [facility];  Service: Orthopedics;  Laterality: Left;  . NEPHRECTOMY  1964   Wilms tumor (left)    Outpatient Medications Prior to Visit  Medication Sig Dispense Refill  . acetaminophen (TYLENOL) 500 MG tablet Take 1,000 mg by mouth every 6 (six) hours as needed for pain.    Marland Kitchen colchicine 0.6 MG tablet Take 0.6 mg by mouth 2 (two) times daily as needed. for gout flare    . ondansetron (ZOFRAN) 4 MG tablet Take 4 mg by mouth.    Marland Kitchen ULORIC 40 MG tablet TAKE 1 TABLET (40 MG TOTAL) BY MOUTH EVERY MORNING. 30 tablet 0  . valACYclovir (VALTREX) 1000 MG  tablet TAKE 2 TABLETS BY MOUTH EVERY 12 HOURS AS 2 DOSES 4 tablet 4  . valsartan (DIOVAN) 160 MG tablet TAKE 1 TABLET (160 MG TOTAL) BY MOUTH EVERY MORNING. 30 tablet 0  . ferrous sulfate 325 (65 FE) MG tablet Take 325 mg by mouth daily with breakfast. Reported on 10/27/2015     No facility-administered medications prior to visit.     Allergies  Allergen Reactions  . Aldomet [Methyldopa]     Severe bone suppression  . Latex Rash    Blisters, rash  . Banana Itching  . Other Other (See Comments)    Scratchy throat and itchy ear Nectarine- itchy throat  dermabond- vascular reaction  . Peach [Prunus Persica] Itching  . Thiazide-Type Diuretics Other (See Comments)    HCTZ increased gout flares  . Crab [Shellfish Allergy] Rash  . Tape Rash    ROS As per HPI  PE: Blood pressure 109/77, pulse 87, temperature 98.3 F (36.8 C), temperature source Oral, resp. rate 16, height 5' 4.75" (1.645 m), weight 192 lb 12 oz (87.4 kg), SpO2 98 %. Gen: Alert, well appearing.  Patient is oriented to person, place, time, and situation. AFFECT: pleasant, lucid thought and speech. CV: RRR, no m/r/g.   LUNGS: CTA bilat, nonlabored resps, good aeration in all lung fields. EXT: no clubbing, cyanosis, or edema.    LABS:  Lab Results  Component Value Date   TSH 1.46 08/01/2014   Lab Results  Component Value Date   WBC 5.6 08/31/2014   HGB 9.9 (L) 08/31/2014   HCT 31.2 (L) 08/31/2014   MCV 78.8 08/31/2014   PLT 255.0 08/31/2014   Lab Results  Component Value Date   CREATININE 1.30 (H) 08/11/2016   BUN 24 (H) 08/01/2014   NA 136 08/01/2014   K 4.6 08/01/2014   CL 105 08/01/2014   CO2 24 08/01/2014   Lab Results  Component Value Date   ALT 22 08/01/2014   AST 27 08/01/2014   ALKPHOS 93 08/01/2014   BILITOT 0.3 08/01/2014    IMPRESSION AND PLAN:  1) HTN; The current medical regimen is effective;  continue present plan and medications. Recent cr/lytes stable per [facility].  Refill  valsartan 160 mg qd.  2) Gout: stable on uloric.  Not requiring any prn colchicine in > 38yr.  3) CRI stage 3: recent lytes normal, GFR stable at 54 ml/min. Avoid NSAIDs.  4) Colon cancer, s/p treatment/cure.  Recent slightly elevated CEA level. If repeat result that she is awaiting today is elevated even more then her oncologist has recommended she get a PET  scan.  An After Visit Summary was printed and given to the patient.  FOLLOW UP: Return in about 1 year (around 10/09/2017) for annual CPE (fasting).  Signed:  Crissie Sickles, MD           10/12/2016

## 2016-10-12 ENCOUNTER — Encounter: Payer: Self-pay | Admitting: Family Medicine

## 2016-10-25 ENCOUNTER — Telehealth: Payer: Self-pay | Admitting: Gastroenterology

## 2016-10-25 NOTE — Telephone Encounter (Signed)
Dr. Fuller Plan see note below. There is a held spot on 11/12/16 at 4pm. Please advise if ok to schedule pt there for colon.

## 2016-10-25 NOTE — Telephone Encounter (Signed)
OK for colonoscopy on 4/3

## 2016-10-25 NOTE — Telephone Encounter (Signed)
Pt scheduled for previsit 11/01/16@9am , colon scheduled for 11/12/16@4pm . Shelly to inform pt of new appts.

## 2016-11-01 ENCOUNTER — Ambulatory Visit (AMBULATORY_SURGERY_CENTER): Payer: Self-pay

## 2016-11-01 VITALS — Ht 65.5 in | Wt 193.4 lb

## 2016-11-01 DIAGNOSIS — C187 Malignant neoplasm of sigmoid colon: Secondary | ICD-10-CM

## 2016-11-01 MED ORDER — SUPREP BOWEL PREP KIT 17.5-3.13-1.6 GM/177ML PO SOLN: 1.0000 | Freq: Once | ORAL | 0 refills | Status: AC

## 2016-11-01 NOTE — Progress Notes (Signed)
No allergies to eggs or soy No home oxygen No diet meds No past problems with anesthesia  Declined emmi  

## 2016-11-12 ENCOUNTER — Ambulatory Visit (AMBULATORY_SURGERY_CENTER): Payer: BLUE CROSS/BLUE SHIELD | Admitting: Gastroenterology

## 2016-11-12 ENCOUNTER — Encounter: Payer: Self-pay | Admitting: Gastroenterology

## 2016-11-12 VITALS — BP 119/76 | HR 74 | Temp 98.0°F | Resp 60 | Ht 65.5 in | Wt 193.0 lb

## 2016-11-12 DIAGNOSIS — D122 Benign neoplasm of ascending colon: Secondary | ICD-10-CM

## 2016-11-12 DIAGNOSIS — D124 Benign neoplasm of descending colon: Secondary | ICD-10-CM | POA: Diagnosis not present

## 2016-11-12 DIAGNOSIS — D120 Benign neoplasm of cecum: Secondary | ICD-10-CM

## 2016-11-12 DIAGNOSIS — D121 Benign neoplasm of appendix: Secondary | ICD-10-CM

## 2016-11-12 DIAGNOSIS — D123 Benign neoplasm of transverse colon: Secondary | ICD-10-CM | POA: Diagnosis not present

## 2016-11-12 DIAGNOSIS — Z85038 Personal history of other malignant neoplasm of large intestine: Secondary | ICD-10-CM

## 2016-11-12 HISTORY — PX: COLONOSCOPY W/ POLYPECTOMY: SHX1380

## 2016-11-12 MED ORDER — SODIUM CHLORIDE 0.9 % IV SOLN: 500.0000 mL | INTRAVENOUS | Status: DC

## 2016-11-12 NOTE — Progress Notes (Signed)
PET SCAN SHOW AREA TO LIVER,CEA LEVELS HAVE INCREASE

## 2016-11-12 NOTE — Progress Notes (Signed)
A and O x3. Report to RN. Tolerated MAC anesthesia well.

## 2016-11-12 NOTE — Op Note (Signed)
[name] Procedure Date: 11/12/2016 3:15 PM MRN: [id number] Endoscopist: Ladene Artist , MD Age: 56 Referring MD:  Date of Birth: Oct 27, 1960 Gender: Female Account #: [id number] Procedure:                Colonoscopy Indications:              High risk colon cancer surveillance: Personal                            history of colon cancer Medicines:                Monitored Anesthesia Care Procedure:                Pre-Anesthesia Assessment:                           - Prior to the procedure, a History and Physical                            was performed, and patient medications and                            allergies were reviewed. The patient's tolerance of                            previous anesthesia was also reviewed. The risks                            and benefits of the procedure and the sedation                            options and risks were discussed with the patient.                            All questions were answered, and informed consent                            was obtained. Prior Anticoagulants: The patient has                            taken no previous anticoagulant or antiplatelet                            agents. ASA Grade Assessment: III - A patient with                            severe systemic disease. After reviewing the risks                            and benefits, the patient was deemed in                            satisfactory condition to undergo the procedure.  After obtaining informed consent, the colonoscope                            was passed under direct vision. Throughout the                            procedure, the patient's blood pressure, pulse, and                            oxygen saturations were monitored continuously. The                            Colonoscope was introduced through the anus and                            advanced to the the cecum, identified  by                            appendiceal orifice and ileocecal valve. The                            ileocecal valve, appendiceal orifice, and rectum                            were photographed. The quality of the bowel                            preparation was good. The colonoscopy was performed                            without difficulty. The patient tolerated the                            procedure well. Scope In: 3:42:22 PM Scope Out: 3:59:28 PM Scope Withdrawal Time: 0 hours 16 minutes 12 seconds  Total Procedure Duration: 0 hours 17 minutes 6 seconds  Findings:                 The perianal and digital rectal examinations were                            normal.                           Five sessile polyps were found in the descending                            colon (4) and transverse colon (1). The polyps were                            6 to 8 mm in size. These polyps were removed with a                            cold snare. Resection and retrieval were complete.  Four sessile polyps were found in the descending                            colon, ascending colon, cecum and appendiceal                            orifice. The polyps were 4 to 5 mm in size. These                            polyps were removed with a cold biopsy forceps.                            Resection and retrieval were complete.                           There was evidence of a prior end-to-end                            colo-colonic anastomosis in the sigmoid colon. This                            was patent and was characterized by healthy                            appearing mucosa. The anastomosis was traversed.                           Internal hemorrhoids were found during                            retroflexion. The hemorrhoids were small and Grade                            I (internal hemorrhoids that do not prolapse). Complications:            No immediate  complications. Estimated blood loss:                            None. Estimated Blood Loss:     Estimated blood loss: none. Impression:               - Five 6 to 8 mm polyps in the descending colon and                            in the transverse colon, removed with a cold snare.                            Resected and retrieved.                           - Four 4 to 5 mm polyps in the descending colon, in                            the ascending colon, in the  cecum and at the                            appendiceal orifice, removed with a cold biopsy                            forceps. Resected and retrieved.                           - Patent end-to-end colo-colonic anastomosis,                            characterized by healthy appearing mucosa.                           - Internal hemorrhoids. Recommendation:           - Repeat colonoscopy in 1 year for surveillance.                           - Patient has a contact number available for                            emergencies. The signs and symptoms of potential                            delayed complications were discussed with the                            patient. Return to normal activities tomorrow.                            Written discharge instructions were provided to the                            patient.                           - Resume previous diet.                           - Continue present medications.                           - Await pathology results. Ladene Artist, MD 11/12/2016 4:05:54 PM This report has been signed electronically.

## 2016-11-12 NOTE — Progress Notes (Signed)
Called to room to assist during endoscopic procedure.  Patient ID and intended procedure confirmed with present staff. Received instructions for my participation in the procedure from the performing physician.  

## 2016-11-12 NOTE — Patient Instructions (Signed)
YOU HAD AN ENDOSCOPIC PROCEDURE TODAY AT THE [facility]:   Refer to the procedure report that was given to you for any specific questions about what was found during the examination.  If the procedure report does not answer your questions, please call your gastroenterologist to clarify.  If you requested that your care partner not be given the details of your procedure findings, then the procedure report has been included in a sealed envelope for you to review at your convenience later.  YOU SHOULD EXPECT: Some feelings of bloating in the abdomen. Passage of more gas than usual.  Walking can help get rid of the air that was put into your GI tract during the procedure and reduce the bloating. If you had a lower endoscopy (such as a colonoscopy or flexible sigmoidoscopy) you may notice spotting of blood in your stool or on the toilet paper. If you underwent a bowel prep for your procedure, you may not have a normal bowel movement for a few days.  Please Note:  You might notice some irritation and congestion in your nose or some drainage.  This is from the oxygen used during your procedure.  There is no need for concern and it should clear up in a day or so.  SYMPTOMS TO REPORT IMMEDIATELY:   Following lower endoscopy (colonoscopy or flexible sigmoidoscopy):  Excessive amounts of blood in the stool  Significant tenderness or worsening of abdominal pains  Swelling of the abdomen that is new, acute  Fever of 100F or higher   For urgent or emergent issues, a gastroenterologist can be reached at any hour by calling [phone].   DIET:  We do recommend a small meal at first, but then you may proceed to your regular diet.  Drink plenty of fluids but you should avoid alcoholic beverages for 24 hours.  ACTIVITY:  You should plan to take it easy for the rest of today and you should NOT DRIVE or use heavy machinery until tomorrow (because of the sedation medicines used during the test).     FOLLOW UP: Our staff will call the number listed on your records the next business day following your procedure to check on you and address any questions or concerns that you may have regarding the information given to you following your procedure. If we do not reach you, we will leave a message.  However, if you are feeling well and you are not experiencing any problems, there is no need to return our call.  We will assume that you have returned to your regular daily activities without incident.  If any biopsies were taken you will be contacted by phone or by letter within the next 1-3 weeks.  Please call us at [phone] if you have not heard about the biopsies in 3 weeks.    SIGNATURES/CONFIDENTIALITY: You and/or your care partner have signed paperwork which will be entered into your electronic medical record.  These signatures attest to the fact that that the information above on your After Visit Summary has been reviewed and is understood.  Full responsibility of the confidentiality of this discharge information lies with you and/or your care-partner.    Handouts were given to your care partner on polyps and hemorrhoids. You may resume your current medications today. Await biopsy results. Please call if any questions or concerns.   

## 2016-11-12 NOTE — Progress Notes (Signed)
Pt had bruising and a small amount of swelling at left inner wrist where unsuccessful IV was made.  I gave pt a warm pack to apply to his area.  Maw  No problems noted in the recovery room. maw

## 2016-11-13 ENCOUNTER — Telehealth: Payer: Self-pay

## 2016-11-13 ENCOUNTER — Telehealth: Payer: Self-pay | Admitting: Gastroenterology

## 2016-11-13 NOTE — Telephone Encounter (Signed)
I reviewed the previous MRI's with her.  She recetly had a PET at [facility] that showed an enhancement in her liver they are wanting to do a biopsy of.  She wanted to review previous records, because she thought that she remembered that there was a liver lesion back in 2016.  All of her questions answered.  I mailed her copies to take to her MD at [facility].

## 2016-11-13 NOTE — Telephone Encounter (Signed)
  Follow up Call-  Call back number 11/12/2016 11/16/2015  Post procedure Call Back phone  # [phone]  Permission to leave phone message Yes Yes  Some recent data might be hidden     Patient questions:  Do you have a fever, pain , or abdominal swelling? No. Pain Score  0 *  Have you tolerated food without any problems? Yes.    Have you been able to return to your normal activities? Yes.    Do you have any questions about your discharge instructions: Diet   No. Medications  No. Follow up visit  No.  Do you have questions or concerns about your Care? No.  Actions: * If pain score is 4 or above: No action needed, pain <4.

## 2016-11-14 ENCOUNTER — Encounter: Payer: Self-pay | Admitting: Family Medicine

## 2016-11-14 ENCOUNTER — Encounter: Payer: Self-pay | Admitting: Gastroenterology

## 2016-11-15 ENCOUNTER — Encounter: Payer: Self-pay | Admitting: Family Medicine

## 2016-11-15 ENCOUNTER — Ambulatory Visit: Payer: BLUE CROSS/BLUE SHIELD | Admitting: Family Medicine

## 2016-11-15 ENCOUNTER — Ambulatory Visit (INDEPENDENT_AMBULATORY_CARE_PROVIDER_SITE_OTHER): Payer: BLUE CROSS/BLUE SHIELD | Admitting: Family Medicine

## 2016-11-15 VITALS — BP 122/80 | HR 83 | Temp 98.0°F | Resp 16 | Ht 64.5 in | Wt 190.8 lb

## 2016-11-15 DIAGNOSIS — K769 Liver disease, unspecified: Secondary | ICD-10-CM

## 2016-11-15 DIAGNOSIS — Z85038 Personal history of other malignant neoplasm of large intestine: Secondary | ICD-10-CM

## 2016-11-15 DIAGNOSIS — J069 Acute upper respiratory infection, unspecified: Secondary | ICD-10-CM | POA: Diagnosis not present

## 2016-11-15 DIAGNOSIS — J209 Acute bronchitis, unspecified: Secondary | ICD-10-CM | POA: Diagnosis not present

## 2016-11-15 MED ORDER — VALSARTAN 160 MG PO TABS: 160.0000 mg | ORAL_TABLET | Freq: Every morning | ORAL | 3 refills | Status: DC

## 2016-11-15 MED ORDER — VALACYCLOVIR HCL 1 G PO TABS: ORAL_TABLET | ORAL | 6 refills | Status: DC

## 2016-11-15 MED ORDER — AZITHROMYCIN 250 MG PO TABS: ORAL_TABLET | ORAL | 0 refills | Status: DC

## 2016-11-15 NOTE — Progress Notes (Signed)
Pre visit review using our clinic review tool, if applicable. No additional management support is needed unless otherwise documented below in the visit note. 

## 2016-11-15 NOTE — Progress Notes (Signed)
OFFICE VISIT  11/17/2016   CC:  Chief Complaint  Patient presents with  . Cough    with fever off and on x 3 days   HPI:    Patient is a 56 y.o. Caucasian female who presents for resp sx's. Onset 3 d/a with scratchy throat, cough, runny nose, Tm 101.5 off/on last few days.   Some rattle with cough.  No wheezing, chest tightness, or sOB.  Recent PET scan showed a spot on her liver with intense uptake, possible colon ca met--getting bx soon. Had colonoscopy earlier this week. NO abdominal pain.  No n/v/d. Not currently or recently on chemotherapy.  Past Medical History:  Diagnosis Date  . Allergy   . Anemia   . Cancer ([facility])    kidney ca  . Chronic renal insufficiency, stage I    Stage II/III (GFR 50s-60s).  No proteinuria.  ARB added 12/2010 by nephrologist for renal protection  . Colon cancer ([place]) 08/2014   09/2014-Low anterior resection of rectum-laparoscopic, + chemo X about 8 cycles with dose reductions for toxicity.  As of 09/2015 f/u with colorectal surgeon and 09/2016 f/u with oncologist she had no radiographic sign of recurrence, but CEA slightly elevated--being followed for continued elevation.  If continues to rise then PET scan planned.  . Gout    Uric acid level decreased from 9 to 4.1 with addition of uloric 40 mg qd (02/2011).  Marland Kitchen History of anemia   . History of blood transfusion 1963  . 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 History of subacute thyroiditis   . HTN (hypertension)    since age 67.  Also hx of PIH.  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  . Pancreatic cyst 2016/17   Stable and benign-appearing on f/u imaging as of 08/2015--repeat MRI planned for 02/2016 per onc  . Postmenopausal   . Right shoulder pain 2017   Dr. Mardelle Matte: pt did well with  injection and PT.  Adhesive capsulitis vs cuff pathology: another injection by Dr. Mardelle Matte 05/20/16 helped x 2 wks.  MRI is next step: pt is considering this.  . Seborrheic dermatitis    Face; consider contact derm (around eyes); Lyndle Herrlich 09/2012--desonide 0.05% cream rx'd  . Solitary pulmonary nodule 2016/17   Stable on f/u CT imaging as of 10/2015---next ct to be 04/2016 per onc    Past Surgical History:  Procedure Laterality Date  . APPENDECTOMY  1964  . BREAST REDUCTION SURGERY  2003   bilateral  . [place]  . COLON SURGERY  09/2014   [facility]  . COLONOSCOPY N/A 08/17/2014   Procedure: COLONOSCOPY;  Surgeon: Ladene Artist, MD;  Location: [facility];  Service: Endoscopy;  Laterality: N/A;  . COLONOSCOPY W/ POLYPECTOMY  11/12/2016   Multiple polyps,some adenomatous but no high grade dysplasia: recall 1 yr for surveillance.  . low anter resect rectum  09/12/14   for colon cancer; [facility], [name]  . LUMBAR LAMINECTOMY  2004   microdiscectomy  . LUMBAR LAMINECTOMY/DECOMPRESSION MICRODISCECTOMY Left 05/21/2013   Procedure: LUMBAR LAMINECTOMY MICRODISCECTOMY L5-S1 LEFT ;  Surgeon: Tobi Bastos, MD;  Location: [facility];  Service: Orthopedics;  Laterality: Left;  . NEPHRECTOMY  1964   Wilms tumor (left)    Outpatient Medications Prior  to Visit  Medication Sig Dispense Refill  . acetaminophen (TYLENOL) 500 MG tablet Take 1,000 mg by mouth every 6 (six) hours as needed for pain.    Marland Kitchen colchicine 0.6 MG tablet Take 1 tablet (0.6 mg total) by mouth 2 (two) times daily as needed. for gout flare 60 tablet 2  . febuxostat (ULORIC) 40 MG tablet Take 1 tablet (40 mg total) by mouth every morning. 30 tablet 11  . ibuprofen (ADVIL,MOTRIN) 400 MG tablet Take 400 mg by mouth every 6 (six) hours as needed.    . ondansetron (ZOFRAN) 4 MG tablet Take 1 tablet (4 mg total) by mouth every 8 (eight) hours as needed for nausea or vomiting. 30 tablet 2  . valACYclovir (VALTREX) 1000 MG tablet  TAKE 2 TABLETS BY MOUTH EVERY 12 HOURS AS 2 DOSES 4 tablet 6  . valsartan (DIOVAN) 160 MG tablet Take 1 tablet (160 mg total) by mouth every morning. 30 tablet 11   Facility-Administered Medications Prior to Visit  Medication Dose Route Frequency Provider Last Rate Last Dose  . 0.9 %  sodium chloride infusion  500 mL Intravenous Continuous Ladene Artist, MD        Allergies  Allergen Reactions  . Aldomet [Methyldopa]     Severe bone suppression  . Latex Rash    Blisters, rash  . Banana Itching  . Other Other (See Comments)    Scratchy throat and itchy ear Nectarine- itchy throat Kiwi by allergy test  dermabond- vascular reaction  . Peach [Prunus Persica] Itching  . Thiazide-Type Diuretics Other (See Comments)    HCTZ increased gout flares  . Crab [Shellfish Allergy] Rash  . Tape Rash    ROS As per HPI  PE: Blood pressure 122/80, pulse 83, temperature 98 F (36.7 C), temperature source Oral, resp. rate 16, height 5' 4.5" (1.638 m), weight 190 lb 12 oz (86.5 kg), SpO2 97 %. VS: noted--normal. Gen: alert, NAD, NONTOXIC APPEARING. HEENT: eyes without injection, drainage, or swelling.  Ears: EACs clear, TMs with normal light reflex and landmarks.  Nose: Clear rhinorrhea, with some dried, crusty exudate adherent to mildly injected mucosa.  No purulent d/c.  No paranasal sinus TTP.  No facial swelling.  Throat and mouth without focal lesion.  No pharyngial swelling, erythema, or exudate.   Neck: supple, no LAD.   LUNGS: CTA bilat, nonlabored resps.   CV: RRR, no m/r/g. EXT: no c/c/e SKIN: no rash  LABS:    Chemistry      Component Value Date/Time   NA 136 08/01/2014 1135   K 4.6 08/01/2014 1135   CL 105 08/01/2014 1135   CO2 24 08/01/2014 1135   BUN 24 (H) 08/01/2014 1135   CREATININE 1.30 (H) 08/11/2016 1416      Component Value Date/Time   CALCIUM 9.0 08/01/2014 1135   ALKPHOS 93 08/01/2014 1135   AST 27 08/01/2014 1135   ALT 22 08/01/2014 1135   BILITOT 0.3  08/01/2014 1135      IMPRESSION AND PLAN:  URI with acute bronchitis.  No RAD component. Has moderate risk procedure in 3d (liver bx). Will start empiric Z-pack.   Mucinex DM recommended.  An After Visit Summary was printed and given to the patient.  FOLLOW UP: Return if symptoms worsen or fail to improve.  Signed:  Crissie Sickles, MD           11/17/2016

## 2016-11-15 NOTE — Patient Instructions (Signed)
Take otc mucinex DM-as directed on the packaging. Use saline nasal spray as needed.

## 2016-11-23 ENCOUNTER — Other Ambulatory Visit: Payer: Self-pay | Admitting: Family Medicine

## 2016-11-27 ENCOUNTER — Encounter: Payer: BLUE CROSS/BLUE SHIELD | Admitting: Gastroenterology

## 2016-11-28 ENCOUNTER — Encounter: Payer: Self-pay | Admitting: Family Medicine

## 2016-12-06 ENCOUNTER — Encounter: Payer: BLUE CROSS/BLUE SHIELD | Admitting: Gastroenterology

## 2016-12-11 HISTORY — PX: LAPAROSCOPIC PARTIAL HEPATECTOMY: SHX5909

## 2016-12-24 ENCOUNTER — Telehealth: Payer: Self-pay | Admitting: Family Medicine

## 2016-12-24 NOTE — Telephone Encounter (Signed)
Noted.  Agree.  Signed:  Crissie Sickles, MD           12/24/2016

## 2016-12-24 NOTE — Telephone Encounter (Signed)
Patient calling in requesting an appointment with PCP for chest discomfort. Patient contacted cardiology office they advised that she see PCP to have EKG and blood work done.  I asked patient to speak with [facility] to talk to a Triage nurse to describe symptoms. Patient refused. Advised patient that she needed to be seen in the ER. Patient refused.

## 2016-12-24 NOTE — Telephone Encounter (Signed)
I spoke to patient and advised her to go to ER.  I told her that Dr Anitra Lauth recommended this as well.   Patient was very upset and wanted to know if that meant " he won't see her tomorrow?"  I advised her that if she did come to our office that she is delaying her care and that we would contact EMS for her.

## 2017-01-09 ENCOUNTER — Encounter: Payer: Self-pay | Admitting: Family Medicine

## 2017-02-13 ENCOUNTER — Ambulatory Visit (INDEPENDENT_AMBULATORY_CARE_PROVIDER_SITE_OTHER): Payer: BLUE CROSS/BLUE SHIELD | Admitting: Podiatry

## 2017-02-13 ENCOUNTER — Encounter: Payer: Self-pay | Admitting: Podiatry

## 2017-02-13 ENCOUNTER — Ambulatory Visit (INDEPENDENT_AMBULATORY_CARE_PROVIDER_SITE_OTHER): Payer: BLUE CROSS/BLUE SHIELD

## 2017-02-13 VITALS — BP 103/68 | HR 82 | Resp 16 | Ht 65.0 in | Wt 170.0 lb

## 2017-02-13 DIAGNOSIS — M779 Enthesopathy, unspecified: Secondary | ICD-10-CM

## 2017-02-13 DIAGNOSIS — G629 Polyneuropathy, unspecified: Secondary | ICD-10-CM | POA: Diagnosis not present

## 2017-02-13 DIAGNOSIS — M2040 Other hammer toe(s) (acquired), unspecified foot: Secondary | ICD-10-CM | POA: Diagnosis not present

## 2017-02-13 NOTE — Progress Notes (Signed)
Subjective:    Patient ID: Elaine Jenkins, female   DOB: 56 y.o.   MRN: [id number]   HPI patient presents concerned about the second digit bilateral and states she gets cramping mostly in her left foot and has been taking chemotherapy for cancer and has developed moderate neuropathy    Review of Systems  All other systems reviewed and are negative.       Objective:  Physical Exam  Cardiovascular: Intact distal pulses.   Musculoskeletal: Normal range of motion.  Neurological: She is alert.  Skin: Skin is warm.  Nursing note and vitals reviewed.  neurovascular status intact muscle strength adequate range of motion within normal limits with patient found to have inflammation and pain around the second metatarsophalangeal joint and mild in nature with what appears to be positive neuroma symptomatology left over right with mild diminishment of sharp [employer]ole vibratory. Patient's found have good digital perfusion well oriented 3     Assessment:   Neuropathic changes with inflammatory capsulitis and pain in the lesser MPJs that has been present for a fairly long time with probable neuroma symptomatology and hammertoe symptomatology      Plan:   H&P condition reviewed and at this point I recommended long-term orthotics to try to dispense weight off the plantar feet. Patient is scanned for customized orthotics to reduce plantar pressure and I do not recommend current digital correction  X-rays indicate mild hammertoe deformity left over right foot with no indications of other bone pathology

## 2017-02-13 NOTE — Progress Notes (Signed)
   Subjective:    Patient ID: Elaine Jenkins, female    DOB: 1961-01-18, 56 y.o.   MRN: [id number]  HPI  Chief Complaint  Patient presents with  . Toe Pain    Bilateral; 2nd toes; pt stated, "Thinks getting hammertoe; wants to prevent it"; x6 months to 1 yr  . Foot Pain    Left foot; plantar; pt stated, "foot feels like it is cramping up every morning"; x2 yrs      Review of Systems  All other systems reviewed and are negative.      Objective:   Physical Exam        Assessment & Plan:

## 2017-03-07 ENCOUNTER — Telehealth: Payer: Self-pay | Admitting: Family Medicine

## 2017-03-07 ENCOUNTER — Ambulatory Visit: Payer: BLUE CROSS/BLUE SHIELD | Admitting: Podiatry

## 2017-03-07 NOTE — Telephone Encounter (Signed)
Patient states her nephrologist in [place], Dr. Carles Collet originally prescribed her valsartan (DIOVAN) 160 MG tablet.  However, Dr. Ernestine Conrad has always refilled it for her.  Since this medication has been recalled she contacted Dr. Clarene Critchley to see which medication he wanted her to switch to.  He has changed her medication to Irbesarton 150mg  and notified [employer].   She states the pharmacy supposedly has contacted Dr. Anitra Lauth about switching to an alternative medication.  She wants Dr. Anitra Lauth to know her kidney doctor has already prescribed another medication so he does not need to worry about prescribing anything unless he has concerns with her taking Irbesarton.

## 2017-03-07 NOTE — Telephone Encounter (Signed)
FYI

## 2017-03-07 NOTE — Telephone Encounter (Signed)
OK. I agree with irbesartan as the new med for her in place of valsartan.-thx

## 2017-03-26 ENCOUNTER — Encounter: Payer: Self-pay | Admitting: Podiatry

## 2017-03-26 ENCOUNTER — Ambulatory Visit (INDEPENDENT_AMBULATORY_CARE_PROVIDER_SITE_OTHER): Payer: Self-pay | Admitting: Podiatry

## 2017-03-26 DIAGNOSIS — M2040 Other hammer toe(s) (acquired), unspecified foot: Secondary | ICD-10-CM

## 2017-03-26 DIAGNOSIS — M779 Enthesopathy, unspecified: Secondary | ICD-10-CM

## 2017-03-26 NOTE — Patient Instructions (Signed)

## 2017-03-27 NOTE — Progress Notes (Signed)
Subjective:    Patient ID: Elaine Jenkins, female   DOB: 56 y.o.   MRN: [id number]   HPI patient presents to pickup orthotics and diminished right shoes    ROS      Objective:  Physical Exam neurovascular status intact no other changes     Assessment:  Tendinitis     Plan:    Orthotics dispensed with no problems and will be seen back in the next 4-6 weeks

## 2017-05-29 ENCOUNTER — Ambulatory Visit (INDEPENDENT_AMBULATORY_CARE_PROVIDER_SITE_OTHER): Payer: BLUE CROSS/BLUE SHIELD

## 2017-05-29 DIAGNOSIS — Z23 Encounter for immunization: Secondary | ICD-10-CM

## 2017-07-09 DIAGNOSIS — Z1231 Encounter for screening mammogram for malignant neoplasm of breast: Secondary | ICD-10-CM | POA: Diagnosis not present

## 2017-07-15 DIAGNOSIS — C187 Malignant neoplasm of sigmoid colon: Secondary | ICD-10-CM | POA: Diagnosis not present

## 2017-07-15 DIAGNOSIS — C787 Secondary malignant neoplasm of liver and intrahepatic bile duct: Secondary | ICD-10-CM | POA: Diagnosis not present

## 2017-07-15 DIAGNOSIS — N281 Cyst of kidney, acquired: Secondary | ICD-10-CM | POA: Diagnosis not present

## 2017-07-15 DIAGNOSIS — R918 Other nonspecific abnormal finding of lung field: Secondary | ICD-10-CM | POA: Diagnosis not present

## 2017-07-15 DIAGNOSIS — C189 Malignant neoplasm of colon, unspecified: Secondary | ICD-10-CM | POA: Diagnosis not present

## 2017-07-15 DIAGNOSIS — K862 Cyst of pancreas: Secondary | ICD-10-CM | POA: Diagnosis not present

## 2017-07-15 LAB — CBC AND DIFFERENTIAL
HCT: 38 (ref 36–46)
Hemoglobin: 12.2 (ref 12.0–16.0)
NEUTROS ABS: 3
PLATELETS: 187 (ref 150–399)
WBC: 5

## 2017-07-15 LAB — BASIC METABOLIC PANEL
BUN: 23 — AB (ref 4–21)
CREATININE: 1.1 (ref 0.5–1.1)
GLUCOSE: 94
Potassium: 4.5 (ref 3.4–5.3)
Sodium: 137 (ref 137–147)

## 2017-07-15 LAB — HEPATIC FUNCTION PANEL
ALT: 21 (ref 7–35)
AST: 25 (ref 13–35)
Alkaline Phosphatase: 118 (ref 25–125)
BILIRUBIN, TOTAL: 0.9

## 2017-08-06 ENCOUNTER — Other Ambulatory Visit: Payer: Self-pay | Admitting: Family Medicine

## 2017-08-06 ENCOUNTER — Telehealth: Payer: Self-pay | Admitting: Family Medicine

## 2017-08-06 MED ORDER — FEBUXOSTAT 40 MG PO TABS: 40.0000 mg | ORAL_TABLET | Freq: Every morning | ORAL | 3 refills | Status: DC

## 2017-08-06 MED ORDER — IRBESARTAN 150 MG PO TABS: 150.0000 mg | ORAL_TABLET | Freq: Every day | ORAL | 3 refills | Status: DC

## 2017-08-06 NOTE — Telephone Encounter (Signed)
Please advise 

## 2017-08-06 NOTE — Telephone Encounter (Signed)
Pt called for refills for uloric 40 mg daily (90 day refill) and irbasartan 150 mg daily 90 day refill); pt states that her valsartan was recalled in July and she was placed on irbasartan per kidney MD in [place]; spoke with Diane at [facility] and she will route to provider and contact pt with final disposition; her contact number is [phone]; she verbalizes understanding; a;so the pt's [facility]ie Rower of choice is [facility]. [place], [place].

## 2017-08-06 NOTE — Telephone Encounter (Signed)
LM for pt stating RX's are at pharmacy for pick up.

## 2017-08-06 NOTE — Telephone Encounter (Signed)
Irbesartan eRx'd. Uloric RF'd as per pt request.

## 2017-08-07 ENCOUNTER — Telehealth: Payer: Self-pay | Admitting: Family Medicine

## 2017-08-07 NOTE — Telephone Encounter (Signed)
Copied from [place]. Topic: Quick Communication - See Telephone Encounter >> Aug 07, 2017  9:52 AM Ahmed Prima L wrote: CRM for notification. See Telephone encounter for:   08/07/17.  Pt is coming in tomorrow for a head cold, she said she has yellow stuff coming out of her eye & thinks its pink eye & it is also red. She wants to know if something could be called in for her today until she can be seen tomorrow. Pharmacy is [facility] on [address] She wants to know if Dr Raoul Pitch will call it in since Dr Anitra Lauth is off today.

## 2017-08-07 NOTE — Telephone Encounter (Signed)
SW pt and advised her that Dr. Raoul Pitch will not send in Rx's without seeing pt first. Pt voiced understanding.

## 2017-08-08 ENCOUNTER — Encounter: Payer: Self-pay | Admitting: Family Medicine

## 2017-08-08 ENCOUNTER — Ambulatory Visit: Payer: BLUE CROSS/BLUE SHIELD | Admitting: Family Medicine

## 2017-08-08 VITALS — BP 107/73 | HR 96 | Temp 98.3°F | Resp 16 | Ht 65.0 in | Wt 179.5 lb

## 2017-08-08 DIAGNOSIS — J0120 Acute ethmoidal sinusitis, unspecified: Secondary | ICD-10-CM | POA: Diagnosis not present

## 2017-08-08 DIAGNOSIS — J209 Acute bronchitis, unspecified: Secondary | ICD-10-CM | POA: Diagnosis not present

## 2017-08-08 MED ORDER — DOXYCYCLINE HYCLATE 100 MG PO CAPS: ORAL_CAPSULE | ORAL | 0 refills | Status: DC

## 2017-08-08 MED ORDER — PREDNISONE 20 MG PO TABS: ORAL_TABLET | ORAL | 0 refills | Status: DC

## 2017-08-08 NOTE — Patient Instructions (Signed)
Get otc generic robitussin DM OR Mucinex DM and use as directed on the packaging for cough and congestion. Use otc generic saline nasal spray 2-3 times per day to irrigate/moisturize your nasal passages.   

## 2017-08-08 NOTE — Progress Notes (Signed)
OFFICE VISIT  08/08/2017   CC:  Chief Complaint  Patient presents with  . URI   HPI:    Patient is a 56 y.o. Caucasian female who presents for respiratory complaints.  "When I get a cold I can never kick it". Onset 9 d/ago, nasal congestion/runny nose, cough productive of bubbly white mucous.  +ST--worse at night. Worse in morning.  Yesterday R eye crusty/draining, mild redness that resolved yesterday. No fever.  No HA.  No n/v/d. Mucinex DM q12h x 5d--not much help. No wheezing.  No SOB.  Cough is rattly.  No improvement in sx's.  Past Medical History:  Diagnosis Date  . Allergy   . Anemia   . Cancer ([place])    kidney ca  . Chronic renal insufficiency, stage I    Stage II/III (GFR 50s-60s).  No proteinuria.  ARB added 12/2010 by nephrologist for renal protection  . Colon cancer ([place]) 08/2014   09/2014-Low anterior resection of rectum-laparoscopic, + chemo X about 8 cycles with dose reductions for toxicity.  As of 09/2015 f/u with colorectal surgeon and 09/2016 f/u with oncologist she had no radiographic sign of recurrence, but CEA slightly elevated--being followed for continued elevation.  It continued to rise so PET scan done; liver lesion showed up. Partial hepatectomy 12/2016.  Marland Kitchen Gout    Uric acid level decreased from 9 to 4.1 with addition of uloric 40 mg qd (02/2011).  Marland Kitchen History of anemia   . History of blood transfusion 1963  . 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 History of subacute thyroiditis   . HTN (hypertension)    since age 71.  Also hx of PIH.  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  . Pancreatic cyst 2016/17   Stable and benign-appearing on f/u imaging as of 08/2015--repeat MRI planned for 02/2016 per onc  . Postmenopausal   . Right shoulder pain 2017    Dr. Mardelle Matte: pt did well with injection and PT.  Adhesive capsulitis vs cuff pathology: another injection by Dr. Mardelle Matte 05/20/16 helped x 2 wks.  MRI is next step: pt is considering this.  . Seborrheic dermatitis    Face; consider contact derm (around eyes); Lyndle Herrlich 09/2012--desonide 0.05% cream rx'd  . Solitary pulmonary nodule 2016/17   Stable on f/u CT imaging as of 10/2015---next ct to be 04/2016 per onc    Past Surgical History:  Procedure Laterality Date  . APPENDECTOMY  1964  . BREAST REDUCTION SURGERY  2003   bilateral  . [place]  . COLON SURGERY  09/2014   [facility]  . COLONOSCOPY N/A 08/17/2014   Procedure: COLONOSCOPY;  Surgeon: Ladene Artist, MD;  Location: [facility];  Service: Endoscopy;  Laterality: N/A;  . COLONOSCOPY W/ POLYPECTOMY  11/12/2016   Multiple polyps,some adenomatous but no high grade dysplasia: recall 1 yr for surveillance.  Marland Kitchen LAPAROSCOPIC PARTIAL HEPATECTOMY  12/11/2016  . low anter resect rectum  09/12/14   for colon cancer; [facility], [name]  . LUMBAR LAMINECTOMY  2004   microdiscectomy  . LUMBAR LAMINECTOMY/DECOMPRESSION MICRODISCECTOMY Left 05/21/2013   Procedure: LUMBAR LAMINECTOMY MICRODISCECTOMY L5-S1 LEFT ;  Surgeon: Tobi Bastos, MD;  Location: [facility];  Service: Orthopedics;  Laterality: Left;  . NEPHRECTOMY  1964   Wilms  tumor (left)    Outpatient Medications Prior to Visit  Medication Sig Dispense Refill  . acetaminophen (TYLENOL) 500 MG tablet Take 1,000 mg by mouth every 6 (six) hours as needed for pain.    Marland Kitchen colchicine 0.6 MG tablet Take 1 tablet (0.6 mg total) by mouth 2 (two) times daily as needed. for gout flare 60 tablet 2  . febuxostat (ULORIC) 40 MG tablet Take 1 tablet (40 mg total) by mouth every morning. 90 tablet 3  . ibuprofen (ADVIL,MOTRIN) 400 MG tablet Take 400 mg by mouth every 6 (six) hours as needed.    . irbesartan (AVAPRO) 150 MG tablet Take 1 tablet (150 mg total) by mouth daily. 90 tablet 3  .  ondansetron (ZOFRAN) 4 MG tablet Take 1 tablet (4 mg total) by mouth every 8 (eight) hours as needed for nausea or vomiting. 30 tablet 2  . valACYclovir (VALTREX) 1000 MG tablet TAKE 2 TABLETS BY MOUTH EVERY 12 HOURS AS 2 DOSES 4 tablet 6  . 0.9 %  sodium chloride infusion      No facility-administered medications prior to visit.     Allergies  Allergen Reactions  . Aldomet [Methyldopa]     Severe bone suppression  . Latex Rash    Blisters, rash  . Banana Itching  . Other Other (See Comments)    Scratchy throat and itchy ear Nectarine- itchy throat Kiwi by allergy test  dermabond- vascular reaction  . Peach [Prunus Persica] Itching  . Plum Pulp Itching  . Thiazide-Type Diuretics Other (See Comments)    HCTZ increased gout flares  . Crab [Shellfish Allergy] Rash  . Tape Rash    ROS As per HPI  PE: Blood pressure 107/73, pulse 96, temperature 98.3 F (36.8 C), temperature source Oral, resp. rate 16, height 5\' 5"  (1.651 m), weight 179 lb 8 oz (81.4 kg), SpO2 97 %. VS: noted--normal. Gen: alert, NAD, NONTOXIC APPEARING. HEENT: eyes without injection, drainage, or swelling.  Ears: EACs clear, TMs with normal light reflex and landmarks.  Nose: Clear rhinorrhea, with some dried, crusty exudate adherent to mildly injected mucosa.  No purulent d/c.  R ethmoid sinus region with some TTP, o/w no paranasal sinus TTP.  No facial swelling.  Throat and mouth without focal lesion.  No pharyngial swelling, erythema, or exudate.   Neck: supple, no LAD.   LUNGS: CTA bilat, nonlabored resps.  Some post-exhalation coughing. CV: RRR, no m/r/g. EXT: no c/c/e SKIN: no rash   LABS:    Chemistry      Component Value Date/Time   NA 136 08/01/2014 1135   K 4.6 08/01/2014 1135   CL 105 08/01/2014 1135   CO2 24 08/01/2014 1135   BUN 24 (H) 08/01/2014 1135   CREATININE 1.30 (H) 08/11/2016 1416      Component Value Date/Time   CALCIUM 9.0 08/01/2014 1135   ALKPHOS 93 08/01/2014 1135   AST  27 08/01/2014 1135   ALT 22 08/01/2014 1135   BILITOT 0.3 08/01/2014 1135     Lab Results  Component Value Date   TSH 1.46 08/01/2014   IMPRESSION AND PLAN:  Acute sinusitis w/acute bronchitis (no RAD component). Not improving at 1 week of illness. Doxy 100 mg bid x 10d. Prednisone 40mg  qd x 5d. Get otc generic robitussin DM OR Mucinex DM and use as directed on the packaging for cough and congestion. Use otc generic saline nasal spray 2-3 times per day to irrigate/moisturize your nasal passages.  An After Visit  Summary was printed and given to the patient.   FOLLOW UP: Return if symptoms worsen or fail to improve.  Signed:  Crissie Sickles, MD           08/08/2017

## 2017-08-11 ENCOUNTER — Encounter: Payer: Self-pay | Admitting: Family Medicine

## 2017-08-29 DIAGNOSIS — N854 Malposition of uterus: Secondary | ICD-10-CM | POA: Diagnosis not present

## 2017-08-29 DIAGNOSIS — D251 Intramural leiomyoma of uterus: Secondary | ICD-10-CM | POA: Diagnosis not present

## 2017-08-29 DIAGNOSIS — N83209 Unspecified ovarian cyst, unspecified side: Secondary | ICD-10-CM | POA: Diagnosis not present

## 2017-08-29 DIAGNOSIS — R9389 Abnormal findings on diagnostic imaging of other specified body structures: Secondary | ICD-10-CM | POA: Diagnosis not present

## 2017-08-29 DIAGNOSIS — R935 Abnormal findings on diagnostic imaging of other abdominal regions, including retroperitoneum: Secondary | ICD-10-CM | POA: Diagnosis not present

## 2017-09-23 DIAGNOSIS — N838 Other noninflammatory disorders of ovary, fallopian tube and broad ligament: Secondary | ICD-10-CM | POA: Diagnosis not present

## 2017-09-23 DIAGNOSIS — N9089 Other specified noninflammatory disorders of vulva and perineum: Secondary | ICD-10-CM | POA: Diagnosis not present

## 2017-10-28 DIAGNOSIS — C19 Malignant neoplasm of rectosigmoid junction: Secondary | ICD-10-CM | POA: Diagnosis not present

## 2017-10-28 DIAGNOSIS — R911 Solitary pulmonary nodule: Secondary | ICD-10-CM | POA: Diagnosis not present

## 2017-10-28 DIAGNOSIS — Z91040 Latex allergy status: Secondary | ICD-10-CM | POA: Diagnosis not present

## 2017-10-28 DIAGNOSIS — N281 Cyst of kidney, acquired: Secondary | ICD-10-CM | POA: Diagnosis not present

## 2017-10-28 DIAGNOSIS — K862 Cyst of pancreas: Secondary | ICD-10-CM | POA: Diagnosis not present

## 2017-10-28 DIAGNOSIS — R1909 Other intra-abdominal and pelvic swelling, mass and lump: Secondary | ICD-10-CM | POA: Diagnosis not present

## 2017-10-28 DIAGNOSIS — Z9889 Other specified postprocedural states: Secondary | ICD-10-CM | POA: Diagnosis not present

## 2017-10-28 DIAGNOSIS — Z888 Allergy status to other drugs, medicaments and biological substances status: Secondary | ICD-10-CM | POA: Diagnosis not present

## 2017-10-28 DIAGNOSIS — C184 Malignant neoplasm of transverse colon: Secondary | ICD-10-CM | POA: Diagnosis not present

## 2017-10-28 DIAGNOSIS — C189 Malignant neoplasm of colon, unspecified: Secondary | ICD-10-CM | POA: Diagnosis not present

## 2017-10-28 DIAGNOSIS — C787 Secondary malignant neoplasm of liver and intrahepatic bile duct: Secondary | ICD-10-CM | POA: Diagnosis not present

## 2017-10-28 DIAGNOSIS — R918 Other nonspecific abnormal finding of lung field: Secondary | ICD-10-CM | POA: Diagnosis not present

## 2017-10-28 DIAGNOSIS — N289 Disorder of kidney and ureter, unspecified: Secondary | ICD-10-CM | POA: Diagnosis not present

## 2017-11-05 ENCOUNTER — Encounter: Payer: Self-pay | Admitting: Gastroenterology

## 2017-12-24 ENCOUNTER — Other Ambulatory Visit: Payer: Self-pay | Admitting: Family Medicine

## 2017-12-29 ENCOUNTER — Other Ambulatory Visit: Payer: Self-pay

## 2017-12-29 ENCOUNTER — Ambulatory Visit (AMBULATORY_SURGERY_CENTER): Payer: Self-pay | Admitting: *Deleted

## 2017-12-29 VITALS — Ht 65.0 in | Wt 186.4 lb

## 2017-12-29 DIAGNOSIS — Z85038 Personal history of other malignant neoplasm of large intestine: Secondary | ICD-10-CM

## 2017-12-29 DIAGNOSIS — Z86010 Personal history of colonic polyps: Secondary | ICD-10-CM

## 2017-12-29 MED ORDER — NA SULFATE-K SULFATE-MG SULF 17.5-3.13-1.6 GM/177ML PO SOLN: 1.0000 [IU] | Freq: Once | ORAL | 0 refills | Status: AC

## 2017-12-29 NOTE — Progress Notes (Signed)
No egg or soy allergy known to patient  No issues with past sedation with any surgeries  or procedures, no intubation problems  No diet pills per patient No home 02 use per patient in one year. Was on O2 1 year ago taken off in June No blood thinners per patient  Pt denies issues with constipation  No A fib or A flutter  Feels once in awhile a flutter and PVCs. Has not had an episode in a long time EMMI video sent to pt's e mail pt declined

## 2018-01-10 HISTORY — PX: COLONOSCOPY: SHX174

## 2018-01-13 ENCOUNTER — Encounter: Payer: BLUE CROSS/BLUE SHIELD | Admitting: Gastroenterology

## 2018-01-15 ENCOUNTER — Ambulatory Visit (AMBULATORY_SURGERY_CENTER): Payer: BLUE CROSS/BLUE SHIELD | Admitting: Gastroenterology

## 2018-01-15 ENCOUNTER — Encounter: Payer: Self-pay | Admitting: Gastroenterology

## 2018-01-15 ENCOUNTER — Other Ambulatory Visit: Payer: Self-pay

## 2018-01-15 VITALS — BP 103/55 | HR 88 | Temp 98.0°F | Resp 12 | Ht 65.0 in | Wt 186.4 lb

## 2018-01-15 DIAGNOSIS — D123 Benign neoplasm of transverse colon: Secondary | ICD-10-CM

## 2018-01-15 DIAGNOSIS — K621 Rectal polyp: Secondary | ICD-10-CM | POA: Diagnosis not present

## 2018-01-15 DIAGNOSIS — Z85038 Personal history of other malignant neoplasm of large intestine: Secondary | ICD-10-CM | POA: Diagnosis present

## 2018-01-15 DIAGNOSIS — D124 Benign neoplasm of descending colon: Secondary | ICD-10-CM

## 2018-01-15 DIAGNOSIS — D120 Benign neoplasm of cecum: Secondary | ICD-10-CM | POA: Diagnosis not present

## 2018-01-15 DIAGNOSIS — D128 Benign neoplasm of rectum: Secondary | ICD-10-CM

## 2018-01-15 DIAGNOSIS — Z1211 Encounter for screening for malignant neoplasm of colon: Secondary | ICD-10-CM | POA: Diagnosis not present

## 2018-01-15 DIAGNOSIS — D129 Benign neoplasm of anus and anal canal: Secondary | ICD-10-CM

## 2018-01-15 MED ORDER — SODIUM CHLORIDE 0.9 % IV SOLN: 500.0000 mL | Freq: Once | INTRAVENOUS | Status: DC

## 2018-01-15 NOTE — Progress Notes (Signed)
Called to room to assist during endoscopic procedure.  Patient ID and intended procedure confirmed with present staff. Received instructions for my participation in the procedure from the performing physician.  

## 2018-01-15 NOTE — Patient Instructions (Signed)
Polyp information given.   YOU HAD AN ENDOSCOPIC PROCEDURE TODAY AT THE [facility]:   Refer to the procedure report that was given to you for any specific questions about what was found during the examination.  If the procedure report does not answer your questions, please call your gastroenterologist to clarify.  If you requested that your care partner not be given the details of your procedure findings, then the procedure report has been included in a sealed envelope for you to review at your convenience later.  YOU SHOULD EXPECT: Some feelings of bloating in the abdomen. Passage of more gas than usual.  Walking can help get rid of the air that was put into your GI tract during the procedure and reduce the bloating. If you had a lower endoscopy (such as a colonoscopy or flexible sigmoidoscopy) you may notice spotting of blood in your stool or on the toilet paper. If you underwent a bowel prep for your procedure, you may not have a normal bowel movement for a few days.  Please Note:  You might notice some irritation and congestion in your nose or some drainage.  This is from the oxygen used during your procedure.  There is no need for concern and it should clear up in a day or so.  SYMPTOMS TO REPORT IMMEDIATELY:   Following lower endoscopy (colonoscopy or flexible sigmoidoscopy):  Excessive amounts of blood in the stool  Significant tenderness or worsening of abdominal pains  Swelling of the abdomen that is new, acute  Fever of 100F or higher   For urgent or emergent issues, a gastroenterologist can be reached at any hour by calling [phone].   DIET:  We do recommend a small meal at first, but then you may proceed to your regular diet.  Drink plenty of fluids but you should avoid alcoholic beverages for 24 hours.  ACTIVITY:  You should plan to take it easy for the rest of today and you should NOT DRIVE or use heavy machinery until tomorrow (because of the sedation  medicines used during the test).    FOLLOW UP: Our staff will call the number listed on your records the next business day following your procedure to check on you and address any questions or concerns that you may have regarding the information given to you following your procedure. If we do not reach you, we will leave a message.  However, if you are feeling well and you are not experiencing any problems, there is no need to return our call.  We will assume that you have returned to your regular daily activities without incident.  If any biopsies were taken you will be contacted by phone or by letter within the next 1-3 weeks.  Please call us at [phone] if you have not heard about the biopsies in 3 weeks.    SIGNATURES/CONFIDENTIALITY: You and/or your care partner have signed paperwork which will be entered into your electronic medical record.  These signatures attest to the fact that that the information above on your After Visit Summary has been reviewed and is understood.  Full responsibility of the confidentiality of this discharge information lies with you and/or your care-partner. 

## 2018-01-15 NOTE — Progress Notes (Signed)
To PACU, VSS. Report to [email or web address] 

## 2018-01-15 NOTE — Op Note (Signed)
[facility] Patient Name: Elaine Jenkins Procedure Date: 01/15/2018 7:57 AM MRN: [id number] Endoscopist: Ladene Artist , MD Age: 57 Referring MD:  Date of Birth: 1961/08/12 Gender: Female Account #: [id number] Procedure:                Colonoscopy Indications:              High risk colon cancer surveillance: Personal                            history of colon cancer Medicines:                Monitored Anesthesia Care Procedure:                Pre-Anesthesia Assessment:                           - Prior to the procedure, a History and Physical                            was performed, and patient medications and                            allergies were reviewed. The patient's tolerance of                            previous anesthesia was also reviewed. The risks                            and benefits of the procedure and the sedation                            options and risks were discussed with the patient.                            All questions were answered, and informed consent                            was obtained. Prior Anticoagulants: The patient has                            taken no previous anticoagulant or antiplatelet                            agents. ASA Grade Assessment: II - A patient with                            mild systemic disease. After reviewing the risks                            and benefits, the patient was deemed in                            satisfactory condition to undergo the procedure.  After obtaining informed consent, the colonoscope                            was passed under direct vision. Throughout the                            procedure, the patient's blood pressure, pulse, and                            oxygen saturations were monitored continuously. The                            Model PCF-H190DL [id number]) scope was introduced                            through the anus and advanced to the  the cecum,                            identified by appendiceal orifice and ileocecal                            valve. The ileocecal valve, appendiceal orifice,                            and rectum were photographed. The quality of the                            bowel preparation was good. The colonoscopy was                            performed without difficulty. The patient tolerated                            the procedure well. Scope In: 8:23:20 AM Scope Out: 8:35:10 AM Scope Withdrawal Time: 0 hours 10 minutes 26 seconds  Total Procedure Duration: 0 hours 11 minutes 50 seconds  Findings:                 The perianal and digital rectal examinations were                            normal.                           Four sessile polyps were found in the transverse                            colon. The polyps were 6 to 7 mm in size. These                            polyps were removed with a cold snare. Resection                            and retrieval were complete.  Three sessile polyps were found in the rectum,                            descending colon and cecum. The polyps were 4 to 5                            mm in size. These polyps were removed with a cold                            biopsy forceps. Resection and retrieval were                            complete.                           Internal hemorrhoids were found during                            retroflexion. The hemorrhoids were small and Grade                            I (internal hemorrhoids that do not prolapse).                           There was evidence of a prior end-to-side                            colo-colonic anastomosis in the sigmoid colon. This                            was patent and was characterized by healthy                            appearing mucosa. The anastomosis was traversed.                           The exam was otherwise without abnormality on                             direct and retroflexion views. Complications:            No immediate complications. Estimated blood loss:                            None. Estimated Blood Loss:     Estimated blood loss: none. Impression:               - Four 6 to 7 mm polyps in the transverse colon,                            removed with a cold snare. Resected and retrieved.                           - Three 4 to 5 mm polyps in the rectum, in the  descending colon and in the cecum, removed with a                            cold biopsy forceps. Resected and retrieved.                           - Internal hemorrhoids.                           - Patent end-to-side colo-colonic anastomosis,                            characterized by healthy appearing mucosa.                           - The examination was otherwise normal on direct                            and retroflexion views. Recommendation:           - Repeat colonoscopy in 1-2 year for surveillance                            pending pathology reveiw.                           - Patient has a contact number available for                            emergencies. The signs and symptoms of potential                            delayed complications were discussed with the                            patient. Return to normal activities tomorrow.                            Written discharge instructions were provided to the                            patient.                           - Resume previous diet.                           - Continue present medications.                           - Await pathology results. Ladene Artist, MD 01/15/2018 8:42:56 AM This report has been signed electronically.

## 2018-01-16 ENCOUNTER — Telehealth: Payer: Self-pay | Admitting: *Deleted

## 2018-01-16 NOTE — Telephone Encounter (Signed)
  Follow up Call-  Call back number 01/15/2018 11/12/2016 11/16/2015  Post procedure Call Back phone  # [phone]  Permission to leave phone message Yes Yes Yes  Some recent data might be hidden     Patient questions:  Do you have a fever, pain , or abdominal swelling? No. Pain Score  0 *  Have you tolerated food without any problems? Yes.    Have you been able to return to your normal activities? Yes.    Do you have any questions about your discharge instructions: Diet   No. Medications  No. Follow up visit  No.  Do you have questions or concerns about your Care? No.  Actions: * If pain score is 4 or above: No action needed, pain <4.

## 2018-01-23 ENCOUNTER — Encounter: Payer: Self-pay | Admitting: Family Medicine

## 2018-01-29 ENCOUNTER — Encounter: Payer: Self-pay | Admitting: Gastroenterology

## 2018-02-03 DIAGNOSIS — C188 Malignant neoplasm of overlapping sites of colon: Secondary | ICD-10-CM | POA: Diagnosis not present

## 2018-02-03 DIAGNOSIS — C787 Secondary malignant neoplasm of liver and intrahepatic bile duct: Secondary | ICD-10-CM | POA: Diagnosis not present

## 2018-02-03 DIAGNOSIS — Z87891 Personal history of nicotine dependence: Secondary | ICD-10-CM | POA: Diagnosis not present

## 2018-02-03 DIAGNOSIS — R1909 Other intra-abdominal and pelvic swelling, mass and lump: Secondary | ICD-10-CM | POA: Diagnosis not present

## 2018-02-03 DIAGNOSIS — C189 Malignant neoplasm of colon, unspecified: Secondary | ICD-10-CM | POA: Diagnosis not present

## 2018-02-03 DIAGNOSIS — N2889 Other specified disorders of kidney and ureter: Secondary | ICD-10-CM | POA: Diagnosis not present

## 2018-02-03 DIAGNOSIS — N289 Disorder of kidney and ureter, unspecified: Secondary | ICD-10-CM | POA: Diagnosis not present

## 2018-02-03 DIAGNOSIS — Z85038 Personal history of other malignant neoplasm of large intestine: Secondary | ICD-10-CM | POA: Diagnosis not present

## 2018-02-03 DIAGNOSIS — Z8505 Personal history of malignant neoplasm of liver: Secondary | ICD-10-CM | POA: Diagnosis not present

## 2018-02-03 DIAGNOSIS — K862 Cyst of pancreas: Secondary | ICD-10-CM | POA: Diagnosis not present

## 2018-02-03 DIAGNOSIS — Z08 Encounter for follow-up examination after completed treatment for malignant neoplasm: Secondary | ICD-10-CM | POA: Diagnosis not present

## 2018-02-03 DIAGNOSIS — R918 Other nonspecific abnormal finding of lung field: Secondary | ICD-10-CM | POA: Diagnosis not present

## 2018-02-08 ENCOUNTER — Encounter: Payer: Self-pay | Admitting: Family Medicine

## 2018-02-20 DIAGNOSIS — Z85038 Personal history of other malignant neoplasm of large intestine: Secondary | ICD-10-CM | POA: Diagnosis not present

## 2018-02-20 DIAGNOSIS — D251 Intramural leiomyoma of uterus: Secondary | ICD-10-CM | POA: Diagnosis not present

## 2018-02-20 DIAGNOSIS — N838 Other noninflammatory disorders of ovary, fallopian tube and broad ligament: Secondary | ICD-10-CM | POA: Diagnosis not present

## 2018-04-03 ENCOUNTER — Ambulatory Visit: Payer: BLUE CROSS/BLUE SHIELD | Admitting: Family Medicine

## 2018-04-03 ENCOUNTER — Encounter: Payer: Self-pay | Admitting: Family Medicine

## 2018-04-03 VITALS — BP 111/77 | HR 85 | Temp 98.2°F | Resp 16

## 2018-04-03 DIAGNOSIS — H6123 Impacted cerumen, bilateral: Secondary | ICD-10-CM

## 2018-04-03 DIAGNOSIS — H9203 Otalgia, bilateral: Secondary | ICD-10-CM | POA: Diagnosis not present

## 2018-04-03 MED ORDER — CARBAMIDE PEROXIDE 6.5 % OT SOLN: 5.0000 [drp] | Freq: Two times a day (BID) | OTIC | 0 refills | Status: DC

## 2018-04-03 NOTE — Patient Instructions (Addendum)
I have called in a solution called debrox. You can use this up to 2x a day when you feel you have wax build up. However, you can also try to use once every 2-4 weeks, just to keep wax build up to a minimum.   Stay on flonse nasal spray.  You may feel a little discomfort today and tomorrow as your ear pressure regulates, that is normal. Your ears exam is normal after removal of wax.

## 2018-04-03 NOTE — Progress Notes (Signed)
Elaine Jenkins , 1961/07/25, 57 y.o., female MRN: [id number] Patient Care Team    Relationship Specialty Notifications Start End  McGowen, Adrian [place], MD PCP - General Family Medicine  07/24/12   Druscilla Brownie, MD Consulting Physician Dermatology  09/23/12   Latanya Maudlin, MD Consulting Physician Orthopedic Surgery  05/21/13   Ladene Artist, MD Consulting Physician Gastroenterology  08/18/14   Raynelle Bring, MD Consulting Physician Urology  09/03/14   Corky Mull, MD Consulting Physician General Surgery  09/03/14    Comment: [facility]: performed her recto-sigmoid cancer resection  Wyatt Portela, MD Consulting Physician Oncology  09/03/14   Cheryll Cockayne, MD Consulting Physician Surgical Oncology  10/05/15    Comment: took over surgical f/u from [name] at [facility] in 2017  Karle Starch, [place] Physician Hematology and Oncology  10/21/15   Marchia Bond, MD Consulting Physician Orthopedic Surgery  04/17/16   Wallene Huh, DPM Consulting Physician Podiatry  02/24/17     Chief Complaint  Patient presents with  . Ear Pain    right ear     Subjective: Pt presents for an OV with complaints of bilateral ear itchiness of 1 week duration.  Associated symptoms include Symptoms of URI about 2 weeks ago. She denies fever, chills, nausea, hearing loss, dizziness or tinnitus.    No flowsheet data found.  Allergies  Allergen Reactions  . Aldomet [Methyldopa]     Severe bone suppression  . Latex Rash    Blisters, rash  . Banana Itching  . Other Other (See Comments)    Scratchy throat and itchy ear Nectarine- itchy throat Kiwi by allergy test  dermabond- vascular reaction  . Peach [Prunus Persica] Itching  . Plum Pulp Itching  . Thiazide-Type Diuretics Other (See Comments)    HCTZ increased gout flares  . Crab [Shellfish Allergy] Rash  . Tape Rash   Social History   Tobacco Use  . Smoking status: Former Smoker    Types: Cigarettes  . Smokeless tobacco: Never  Used  . Tobacco comment: light in college  Substance Use Topics  . Alcohol use: Yes    Alcohol/week: 1.0 standard drinks    Types: 1 Cans of beer per week    Comment: holidays and special occasions; 3-4 times yearly   Past Medical History:  Diagnosis Date  . Allergy   . Anemia   . Arthritis    rt, shoulder  . Chronic renal insufficiency, stage 3 (moderate) (HCC)    Stage II/III (GFR 50s-60s).  No proteinuria.  ARB added 12/2010 by nephrologist for renal protection.  Cr 1.1, GFR 55 on labs with onc 07/15/17.  . Colon cancer ([place]) 08/2014   (METASTATIC TO LIVER 2018)--Initial dx 09/2014-Low anterior resection of rectum-laparoscopic, + chemo X about 8 cycles with dose reductions for toxicity.  2018--CEA continued to rise so PET scan done; liver lesion showed up. Partial hepatectomy 12/2016.  f/u imaging 07/16/17--no signn of recurrence.  Prominence of left adenexa noted so onc ordered pelvic u/s.  Marland Kitchen Gout    Uric acid level decreased from 9 to 4.1 with addition of uloric 40 mg qd (02/2011).  Marland Kitchen History of anemia   . History of blood transfusion 1963  . 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 History of subacute thyroiditis   . HTN (hypertension)    since age 66.  Also hx of PIH.  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  Marland Kitchen Pancreatic cyst 2016/17/18   Stable and benign-appearing on f/u imaging as of 08/2015.  Stable on CT w/contrast 07/16/17.  Marland Kitchen Postmenopausal   . Right shoulder pain 2017   Dr. Mardelle Matte: pt did well with injection and PT.  Adhesive capsulitis vs cuff pathology: another injection by Dr. Mardelle Matte 05/20/16 helped x 2 wks.  MRI is next step: pt is considering this.  . Seborrheic dermatitis    Face; consider contact derm (around eyes); Lyndle Herrlich 09/2012--desonide 0.05% cream rx'd  . Solitary pulmonary  nodule 2016/17   Stable on f/u CT imaging as of 10/2015.  Stable/unchanged 4 mm nodule RML on CT chest w/contrast 07/16/17.   Past Surgical History:  Procedure Laterality Date  . APPENDECTOMY  1964  . BREAST REDUCTION SURGERY  2003   bilateral  . [place]  . COLON SURGERY  09/2014   [facility]  . COLONOSCOPY N/A 08/17/2014   Procedure: COLONOSCOPY;  Surgeon: Ladene Artist, MD;  Location: [facility];  Service: Endoscopy;  Laterality: N/A;  . COLONOSCOPY  01/2018   Polypectomy--tubular adenoma with NO high grade dysplasia.  Repeat 1-2 yrs for surveillance.  . COLONOSCOPY W/ POLYPECTOMY  11/12/2016   Multiple polyps,some adenomatous but no high grade dysplasia: recall 1 yr for surveillance.  Marland Kitchen LAPAROSCOPIC PARTIAL HEPATECTOMY  12/11/2016  . low anter resect rectum  09/12/14   for colon cancer; [facility], [name]  . LUMBAR LAMINECTOMY  2004   microdiscectomy  . LUMBAR LAMINECTOMY/DECOMPRESSION MICRODISCECTOMY Left 05/21/2013   Procedure: LUMBAR LAMINECTOMY MICRODISCECTOMY L5-S1 LEFT ;  Surgeon: Tobi Bastos, MD;  Location: [facility];  Service: Orthopedics;  Laterality: Left;  . NEPHRECTOMY  1964   Wilms tumor (left)  . POLYPECTOMY     Family History  Problem Relation Age of Onset  . Hypertension Father   . Heart disease Father   . Alzheimer's disease Father   . Crohn's disease Father   . Colon cancer Father        diagnosed 80's  . Hypertension Mother   . Heart disease Mother   . COPD Mother   . Colon polyps Sister   . Colon polyps Brother   . Esophageal cancer Neg Hx   . Rectal cancer Neg Hx   . Stomach cancer Neg Hx    Allergies as of 04/03/2018      Reactions   Aldomet [methyldopa]    Severe bone suppression   Latex Rash   Blisters, rash   Banana Itching   Other Other (See Comments)   Scratchy throat and itchy ear Nectarine- itchy throat Kiwi by allergy test dermabond- vascular reaction   Peach [prunus Persica] Itching   Plum Pulp Itching    Thiazide-type Diuretics Other (See Comments)   HCTZ increased gout flares   Crab [shellfish Allergy] Rash   Tape Rash      Medication List        Accurate as of 04/03/18  2:50 PM. Always use your most recent med list.          acetaminophen 500 MG tablet Commonly known as:  TYLENOL Take 1,000 mg by mouth every 6 (six) hours as needed for pain.   calcium carbonate 500 MG chewable tablet Commonly known as:  TUMS - dosed in mg elemental calcium Chew 1 tablet by mouth daily as needed for indigestion or  heartburn.   cetirizine 10 MG chewable tablet Commonly known as:  ZYRTEC Chew 10 mg by mouth as needed for allergies.   colchicine 0.6 MG tablet Take 1 tablet (0.6 mg total) by mouth 2 (two) times daily as needed. for gout flare   febuxostat 40 MG tablet Commonly known as:  ULORIC Take 1 tablet (40 mg total) by mouth every morning.   ibuprofen 400 MG tablet Commonly known as:  ADVIL,MOTRIN Take 400 mg by mouth every 6 (six) hours as needed.   irbesartan 150 MG tablet Commonly known as:  AVAPRO Take 1 tablet (150 mg total) by mouth daily.   ondansetron 4 MG tablet Commonly known as:  ZOFRAN Take 1 tablet (4 mg total) by mouth every 8 (eight) hours as needed for nausea or vomiting.   valACYclovir 1000 MG tablet Commonly known as:  VALTREX TAKE 2 TABLETS BY MOUTH EVERY 12 HOURS AS 2 DOSES       All past medical history, surgical history, allergies, family history, immunizations andmedications were updated in the EMR today and reviewed under the history and medication portions of their EMR.     ROS: Negative, with the exception of above mentioned in HPI   Objective:  BP 111/77 (BP Location: Right Arm, Patient Position: Sitting, Cuff Size: Large)   Pulse 85   Temp 98.2 F (36.8 C) (Rectal)   Resp 16   SpO2 98%  There is no height or weight on file to calculate BMI. Gen: Afebrile. No acute distress. Nontoxic in appearance, well developed, well nourished. Pleasant  caucasian female.  HENT: AT. [place]. Bilateral TM unable to be visualized secondary to cerumen. MMM, no oral lesions. Bilateral nares without erythema or drianage. Throat without erythema or exudates. No cough or hoarseness.  Eyes:Pupils Equal Round Reactive to light, Extraocular movements intact,  Conjunctiva without redness, discharge or icterus.  No exam data present No results found. No results found for this or any previous visit (from the past 24 hour(s)).  Assessment/Plan: PEARLEY MILLINGTON is a 57 y.o. female present for OV for  Bilateral impacted cerumen Ear discomfort, bilateral - ear lavage and instrumentation completed today.  - bilateral TM exam after was WNL, without perforation, erythema or bulging. EAc normal.  Bilateral cerumen removed successfully.  - pt tolerated procedure.  - start flonase.  - avoid qtip use, except on the outside of the ear.  - debrox solution prescribed to help keep cerumen burden to minimum. Use 1-2x a month PRN  - F/U with PCP PRN   Reviewed expectations re: course of current medical issues.  Discussed self-management of symptoms.  Outlined signs and symptoms indicating need for more acute intervention.  Patient verbalized understanding and all questions were answered.  Patient received an After-Visit Summary.    No orders of the defined types were placed in this encounter.   Note is dictated utilizing voice recognition software. Although note has been proof read prior to signing, occasional typographical errors still can be missed. If any questions arise, please do not hesitate to call for verification.   electronically signed by:  Howard Pouch, DO  [place]

## 2018-04-24 ENCOUNTER — Ambulatory Visit: Payer: BLUE CROSS/BLUE SHIELD

## 2018-05-01 ENCOUNTER — Ambulatory Visit (INDEPENDENT_AMBULATORY_CARE_PROVIDER_SITE_OTHER): Payer: BLUE CROSS/BLUE SHIELD

## 2018-05-01 DIAGNOSIS — Z23 Encounter for immunization: Secondary | ICD-10-CM

## 2018-05-12 DIAGNOSIS — L309 Dermatitis, unspecified: Secondary | ICD-10-CM | POA: Diagnosis not present

## 2018-06-01 DIAGNOSIS — D1801 Hemangioma of skin and subcutaneous tissue: Secondary | ICD-10-CM | POA: Diagnosis not present

## 2018-06-01 DIAGNOSIS — L821 Other seborrheic keratosis: Secondary | ICD-10-CM | POA: Diagnosis not present

## 2018-06-01 DIAGNOSIS — D225 Melanocytic nevi of trunk: Secondary | ICD-10-CM | POA: Diagnosis not present

## 2018-06-01 DIAGNOSIS — L905 Scar conditions and fibrosis of skin: Secondary | ICD-10-CM | POA: Diagnosis not present

## 2018-06-02 DIAGNOSIS — K862 Cyst of pancreas: Secondary | ICD-10-CM | POA: Diagnosis not present

## 2018-06-02 DIAGNOSIS — Z8049 Family history of malignant neoplasm of other genital organs: Secondary | ICD-10-CM | POA: Diagnosis not present

## 2018-06-02 DIAGNOSIS — N2889 Other specified disorders of kidney and ureter: Secondary | ICD-10-CM | POA: Diagnosis not present

## 2018-06-02 DIAGNOSIS — Z803 Family history of malignant neoplasm of breast: Secondary | ICD-10-CM | POA: Diagnosis not present

## 2018-06-02 DIAGNOSIS — K8689 Other specified diseases of pancreas: Secondary | ICD-10-CM | POA: Diagnosis not present

## 2018-06-02 DIAGNOSIS — R918 Other nonspecific abnormal finding of lung field: Secondary | ICD-10-CM | POA: Diagnosis not present

## 2018-06-02 DIAGNOSIS — C189 Malignant neoplasm of colon, unspecified: Secondary | ICD-10-CM | POA: Diagnosis not present

## 2018-06-02 DIAGNOSIS — N281 Cyst of kidney, acquired: Secondary | ICD-10-CM | POA: Diagnosis not present

## 2018-06-02 DIAGNOSIS — I10 Essential (primary) hypertension: Secondary | ICD-10-CM | POA: Diagnosis not present

## 2018-06-02 DIAGNOSIS — C787 Secondary malignant neoplasm of liver and intrahepatic bile duct: Secondary | ICD-10-CM | POA: Diagnosis not present

## 2018-06-02 DIAGNOSIS — Z800 Family history of malignant neoplasm of digestive organs: Secondary | ICD-10-CM | POA: Diagnosis not present

## 2018-06-02 DIAGNOSIS — C19 Malignant neoplasm of rectosigmoid junction: Secondary | ICD-10-CM | POA: Diagnosis not present

## 2018-06-22 ENCOUNTER — Encounter: Payer: Self-pay | Admitting: Allergy and Immunology

## 2018-06-22 ENCOUNTER — Ambulatory Visit: Payer: BLUE CROSS/BLUE SHIELD | Admitting: Allergy and Immunology

## 2018-06-22 VITALS — BP 130/90 | HR 84 | Temp 98.0°F | Resp 16 | Ht 65.0 in | Wt 187.6 lb

## 2018-06-22 DIAGNOSIS — J310 Chronic rhinitis: Secondary | ICD-10-CM | POA: Insufficient documentation

## 2018-06-22 DIAGNOSIS — H1010 Acute atopic conjunctivitis, unspecified eye: Secondary | ICD-10-CM | POA: Insufficient documentation

## 2018-06-22 DIAGNOSIS — L500 Allergic urticaria: Secondary | ICD-10-CM | POA: Diagnosis not present

## 2018-06-22 DIAGNOSIS — T7840XD Allergy, unspecified, subsequent encounter: Secondary | ICD-10-CM | POA: Diagnosis not present

## 2018-06-22 DIAGNOSIS — H1013 Acute atopic conjunctivitis, bilateral: Secondary | ICD-10-CM

## 2018-06-22 DIAGNOSIS — T7840XA Allergy, unspecified, initial encounter: Secondary | ICD-10-CM | POA: Insufficient documentation

## 2018-06-22 MED ORDER — OLOPATADINE HCL 0.7 % OP SOLN: 1.0000 [drp] | Freq: Every day | OPHTHALMIC | 5 refills | Status: DC

## 2018-06-22 MED ORDER — FLUTICASONE PROPIONATE 50 MCG/ACT NA SUSP: NASAL | 5 refills | Status: DC

## 2018-06-22 MED ORDER — EPINEPHRINE 0.3 MG/0.3ML IJ SOAJ: INTRAMUSCULAR | 2 refills | Status: DC

## 2018-06-22 NOTE — Assessment & Plan Note (Addendum)
Skin tests today cannot be interpreted due to inadequate histamine response.  A laboratory order form has been provided for serum specific IgE against banana, peaches, pears, nectarine, plum, avocado, shellfish panel, peanut, peanut component, tree nut panel with reflex components, and zone 2 environmental aeroallergens panel.  When lab results have returned the patient will be called with further recommendations and follow up instructions.  For now, continue careful avoidance of suspected foods and have access to epinephrine autoinjectors.  A prescription has been provided for epinephrine 0.3 mg autoinjector 2 pack (Auvi-Q) along with instructions for its proper administration.

## 2018-06-22 NOTE — Assessment & Plan Note (Signed)
   Treatment plan as outlined above for allergic rhinitis.  A prescription has been provided for Pazeo, one drop per eye daily as needed.  I have also recommended eye lubricant drops (i.e., Natural Tears) as needed. 

## 2018-06-22 NOTE — Patient Instructions (Addendum)
Allergic reaction/history of food allergy Skin tests today cannot be interpreted due to inadequate histamine response.  A laboratory order form has been provided for serum specific IgE against banana, peaches, pears, nectarine, plum, avocado, shellfish panel, peanut, peanut component, tree nut panel with reflex components, and zone 2 environmental aeroallergens panel.  When lab results have returned the patient will be called with further recommendations and follow up instructions.  For now, continue careful avoidance of suspected foods and have access to epinephrine autoinjectors.  A prescription has been provided for epinephrine 0.3 mg autoinjector 2 pack (Auvi-Q) along with instructions for its proper administration.  Chronic rhinitis The patient's history suggests seasonal and perennial allergic rhinitis, however skin test today could not be interpreted due to inadequate histamine response.  Lab order form has been provided (as above).  A prescription has been provided for fluticasone nasal spray, 2 sprays per nostril daily if needed. Proper nasal spray technique has been discussed and demonstrated.  Nasal saline spray (i.e., Simply Saline) or nasal saline lavage (i.e., NeilMed) is recommended as needed and prior to medicated nasal sprays.  The patient be called with further recommendations after lab results have returned.  Conjunctivitis  Treatment plan as outlined above for allergic rhinitis.  A prescription has been provided for Pazeo, one drop per eye daily as needed.  I have also recommended eye lubricant drops (i.e., Natural Tears) as needed.   When lab results have returned the patient will be called with further recommendations and follow up instructions.

## 2018-06-22 NOTE — Assessment & Plan Note (Signed)
The patient's history suggests seasonal and perennial allergic rhinitis, however skin test today could not be interpreted due to inadequate histamine response.  Lab order form has been provided (as above).  A prescription has been provided for fluticasone nasal spray, 2 sprays per nostril daily if needed. Proper nasal spray technique has been discussed and demonstrated.  Nasal saline spray (i.e., Simply Saline) or nasal saline lavage (i.e., NeilMed) is recommended as needed and prior to medicated nasal sprays.  The patient be called with further recommendations after lab results have returned.

## 2018-06-22 NOTE — Progress Notes (Signed)
New Patient Note  RE: Elaine Jenkins MRN: [id number] DOB: 1961-06-20 Date of Office Visit: 06/22/2018  Referring provider: Tammi Sou, MD Primary care provider: Tammi Sou, MD  Chief Complaint: Allergic Reaction and Pruritis   History of present illness: Elaine Jenkins is a 57 y.o. female seen today in consultation requested by Elaine Dapper, MD. 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  She reports that approximately 25 years ago she consumed crab and developed a "head to toe rash."  She does not recall details of the rash other than it was pruritic.  She did not experience concomitant cardiopulmonary or GI symptoms.  She has avoided crab since that time.  She states that over the past 15 years she has experienced oral pharyngeal pruritus with consumption of bananas, peaches, pears, nectarines, and plums.  Ports that approximately 6 weeks ago she consumed guacamole and 2 hours later vomited.  She denies urticaria, angioedema, and cardiopulmonary symptoms and reports that after vomiting she "felt great right afterwards."  She feels the need to repeatedly clear her throat when she consumes peanuts, almonds, and/or walnuts. She experiences nasal congestion, rhinorrhea, sneezing, postnasal drainage, nasal pruritus, and ocular pruritus.  These symptoms occur most frequently during the springtime but are also aggravating during the fall.  She typically attempts to tolerate the symptoms without taking medications if possible.   Assessment and plan: Allergic reaction/history of food allergy Skin tests today cannot be interpreted due to inadequate histamine response.  A laboratory order form has been provided for serum specific IgE against banana, peaches, pears, nectarine, plum, avocado, shellfish panel, peanut, peanut component, tree nut panel with reflex components, and zone 2 environmental aeroallergens  panel.  When lab results have returned the patient will be called with further recommendations and follow up instructions.  For now, continue careful avoidance of suspected foods and have access to epinephrine autoinjectors.  A prescription has been provided for epinephrine 0.3 mg autoinjector 2 pack (Auvi-Q) along with instructions for its proper administration.  Chronic rhinitis The patient's history suggests seasonal and perennial allergic rhinitis, however skin test today could not be interpreted due to inadequate histamine response.  Lab order form has been provided (as above).  A prescription has been provided for fluticasone nasal spray, 2 sprays per nostril daily if needed. Proper nasal spray technique has been discussed and demonstrated.  Nasal saline spray (i.e., Simply Saline) or nasal saline lavage (i.e., NeilMed) is recommended as needed and prior to medicated nasal sprays.  The patient be called with further recommendations after lab results have returned.  Conjunctivitis  Treatment plan as outlined above for allergic rhinitis.  A prescription has been provided for Pazeo, one drop per eye daily as needed.  I have also recommended eye lubricant drops (i.e., Natural Tears) as needed.   Meds ordered this encounter  Medications  . EPINEPHrine (AUVI-Q) 0.3 mg/0.3 mL IJ SOAJ injection    Sig: Use as directed for severe allergic reaction    Dispense:  2 Device    Refill:  2    [phone] (M)  . fluticasone (FLONASE) 50 MCG/ACT nasal spray    Sig: Use 2 spray per nostril daily as needed    Dispense:  16 g    Refill:  5  . Olopatadine HCl (PAZEO) 0.7 % SOLN    Sig: Place 1 drop into both eyes daily.    Dispense:  1 Bottle  Refill:  5    Diagnostics: Environmental skin testing: Negative, however histamine control was nonreactive. Food allergen skin testing: Negative, however histamine control was nonreactive.   Physical examination: Blood pressure 130/90,  pulse 84, temperature 98 F (36.7 C), temperature source Oral, resp. rate 16, height 5\' 5"  (1.651 m), weight 187 lb 9.6 oz (85.1 kg), SpO2 98 %.  General: Alert, interactive, in no acute distress. HEENT: TMs pearly gray, turbinates moderately edematous without discharge, post-pharynx moderately erythematous. Neck: Supple without lymphadenopathy. Lungs: Clear to auscultation without wheezing, rhonchi or rales. CV: Normal S1, S2 without murmurs. Abdomen: Nondistended, nontender. Skin: Warm and dry, without lesions or rashes. Extremities:  No clubbing, cyanosis or edema. Neuro:   Grossly intact.  Review of systems:  Review of systems negative except as noted in HPI / PMHx or noted below: Review of Systems  Constitutional: Negative.   HENT: Negative.   Eyes: Negative.   Respiratory: Negative.   Cardiovascular: Negative.   Gastrointestinal: Negative.   Genitourinary: Negative.   Musculoskeletal: Negative.   Skin: Negative.   Neurological: Negative.   Endo/Heme/Allergies: Negative.   Psychiatric/Behavioral: Negative.     Past medical history:  Past Medical History:  Diagnosis Date  . Allergy   . Anemia   . Arthritis    rt, shoulder  . Chronic renal insufficiency, stage 3 (moderate) (HCC)    Stage II/III (GFR 50s-60s).  No proteinuria.  ARB added 12/2010 by nephrologist for renal protection.  Cr 1.1, GFR 55 on labs with onc 07/15/17.  . Colon cancer ([place]) 08/2014   (METASTATIC TO LIVER 2018)--Initial dx 09/2014-Low anterior resection of rectum-laparoscopic, + chemo X about 8 cycles with dose reductions for toxicity.  2018--CEA continued to rise so PET scan done; liver lesion showed up. Partial hepatectomy 12/2016.  f/u imaging 07/16/17--no signn of recurrence.  Prominence of left adenexa noted so onc ordered pelvic u/s.  Marland Kitchen Gout    Uric acid level decreased from 9 to 4.1 with addition of uloric 40 mg qd (02/2011).  Marland Kitchen History of anemia   . History of blood transfusion 1963  . History of  nephrectomy, unilateral 1964   Wilms tumor at age 54 (right kidney)  . History of subacute thyroiditis   . HTN (hypertension)    since age 89.  Also hx of PIH.  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  Marland Kitchen Pancreatic cyst 2016/17/18   Stable and benign-appearing on f/u imaging as of 08/2015.  Stable on CT w/contrast 07/16/17.  Marland Kitchen Postmenopausal   . Right shoulder pain 2017   Dr. Mardelle Matte: pt did well with injection and PT.  Adhesive capsulitis vs cuff pathology: another injection by Dr. Mardelle Matte 05/20/16 helped x 2 wks.  MRI is next step: pt is considering this.  . Seborrheic dermatitis    Face; consider contact derm (around eyes); Lyndle Herrlich 09/2012--desonide 0.05% cream rx'd  . Solitary pulmonary nodule 2016/17   Stable on f/u CT imaging as of 10/2015.  Stable/unchanged 4 mm nodule RML on CT chest w/contrast 07/16/17.    Past surgical history:  Past Surgical History:  Procedure Laterality Date  . APPENDECTOMY  1964  . BREAST REDUCTION SURGERY  2003   bilateral  . [place]  . COLON SURGERY  09/2014   [facility]  . COLONOSCOPY N/A 08/17/2014   Procedure: COLONOSCOPY;  Surgeon: Pricilla Riffle  Fuller Plan, MD;  Location: [facility];  Service: Endoscopy;  Laterality: N/A;  . COLONOSCOPY  01/2018   Polypectomy--tubular adenoma with NO high grade dysplasia.  Repeat 1-2 yrs for surveillance.  . COLONOSCOPY W/ POLYPECTOMY  11/12/2016   Multiple polyps,some adenomatous but no high grade dysplasia: recall 1 yr for surveillance.  Marland Kitchen LAPAROSCOPIC PARTIAL HEPATECTOMY  12/11/2016  . liver mass excision    . low anter resect rectum  09/12/14   for colon cancer; [facility], [name]  . LUMBAR LAMINECTOMY  2004   microdiscectomy  . LUMBAR LAMINECTOMY/DECOMPRESSION MICRODISCECTOMY Left 05/21/2013   Procedure: LUMBAR LAMINECTOMY MICRODISCECTOMY L5-S1 LEFT ;  Surgeon:  Tobi Bastos, MD;  Location: [facility];  Service: Orthopedics;  Laterality: Left;  . NEPHRECTOMY  1964   Wilms tumor (left)  . POLYPECTOMY      Family history: Family History  Problem Relation Age of Onset  . Hypertension Father   . Heart disease Father   . Alzheimer's disease Father   . Crohn's disease Father   . Colon cancer Father        diagnosed 8's  . Hypertension Mother   . Heart disease Mother   . COPD Mother   . Colon polyps Sister   . Colon polyps Brother   . Esophageal cancer Neg Hx   . Rectal cancer Neg Hx   . Stomach cancer Neg Hx     Social history: Social History   Socioeconomic History  . Marital status: Married    Spouse name: Not on file  . Number of children: Not on file  . Years of education: Not on file  . Highest education level: Not on file  Occupational History  . Not on file  Social Needs  . Financial resource strain: Not on file  . Food insecurity:    Worry: Not on file    Inability: Not on file  . Transportation needs:    Medical: Not on file    Non-medical: Not on file  Tobacco Use  . Smoking status: Former Smoker    Types: Cigarettes  . Smokeless tobacco: Never Used  . Tobacco comment: light in college  Substance and Sexual Activity  . Alcohol use: Yes    Alcohol/week: 1.0 standard drinks    Types: 1 Cans of beer per week    Comment: holidays and special occasions; 3-4 times yearly  . Drug use: No    Frequency: 1.0 times per week  . Sexual activity: Yes  Lifestyle  . Physical activity:    Days per week: Not on file    Minutes per session: Not on file  . Stress: Not on file  Relationships  . Social connections:    Talks on phone: Not on file    Gets together: Not on file    Attends religious service: Not on file    Active member of club or organization: Not on file    Attends meetings of clubs or organizations: Not on file    Relationship status: Not on file  . Intimate partner violence:    Fear of current or ex  partner: Not on file    Emotionally abused: Not on file    Physically abused: Not on file    Forced sexual activity: Not on file  Other Topics Concern  . Not on file  Social History Narrative   Married, one child.   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   Relocated to [place] from [place], [place] 2013.  No Tobacco.  Rare alcohol.  No drug use.    Environmental History: The patient lives in a house with hardwood floors throughout, gas heat, and central air.  There is no known mold/water damage in the home.  She is a former cigarette smoker having quit 30 years ago.  There are no pets in the home.  Allergies as of 06/22/2018      Reactions   Aldomet [methyldopa]    Severe bone suppression   Latex Rash   Blisters, rash   Banana Itching   Benzthiazide Other (See Comments)   HCTZ increased gout flares   Kiwi Extract Other (See Comments)   Scratchy throat and itchy ear   Other Other (See Comments)   Scratchy throat and itchy ear Nectarine- itchy throat Kiwi by allergy test dermabond- vascular reaction   Peach [prunus Persica] Itching   Plum Pulp Itching   Thiazide-type Diuretics Other (See Comments)   HCTZ increased gout flares   Crab [shellfish Allergy] Rash   Tape Rash      Medication List        Accurate as of 06/22/18 10:14 PM. Always use your most recent med list.          acetaminophen 500 MG tablet Commonly known as:  TYLENOL Take 1,000 mg by mouth every 6 (six) hours as needed for pain.   calcium carbonate 500 MG chewable tablet Commonly known as:  TUMS - dosed in mg elemental calcium Chew 1 tablet by mouth daily as needed for indigestion or heartburn.   cetirizine 10 MG chewable tablet Commonly known as:  ZYRTEC Chew 10 mg by mouth as needed for allergies.   colchicine 0.6 MG tablet Take 1 tablet (0.6 mg total) by mouth 2 (two) times daily as needed. for gout flare   EPINEPHrine 0.3 mg/0.3 mL Soaj injection Commonly known as:  EPI-PEN Use as  directed for severe allergic reaction   febuxostat 40 MG tablet Commonly known as:  ULORIC Take 1 tablet (40 mg total) by mouth every morning.   fluticasone 50 MCG/ACT nasal spray Commonly known as:  FLONASE Use 2 spray per nostril daily as needed   irbesartan 150 MG tablet Commonly known as:  AVAPRO Take 1 tablet (150 mg total) by mouth daily.   Olopatadine HCl 0.7 % Soln Place 1 drop into both eyes daily.   triamcinolone ointment 0.1 % Commonly known as:  KENALOG APP EXT AA BID   valACYclovir 1000 MG tablet Commonly known as:  VALTREX TAKE 2 TABLETS BY MOUTH EVERY 12 HOURS AS 2 DOSES       Known medication allergies: Allergies  Allergen Reactions  . Aldomet [Methyldopa]     Severe bone suppression  . Latex Rash    Blisters, rash  . Banana Itching  . Benzthiazide Other (See Comments)    HCTZ increased gout flares  . Kiwi Extract Other (See Comments)    Scratchy throat and itchy ear  . Other Other (See Comments)    Scratchy throat and itchy ear Nectarine- itchy throat Kiwi by allergy test  dermabond- vascular reaction  . Peach [Prunus Persica] Itching  . Plum Pulp Itching  . Thiazide-Type Diuretics Other (See Comments)    HCTZ increased gout flares  . Crab [Shellfish Allergy] Rash  . Tape Rash    I appreciate the opportunity to take part in [facility] care. Please do not hesitate to contact me with questions.  Sincerely,   R. Edgar Frisk, MD

## 2018-06-23 ENCOUNTER — Telehealth: Payer: Self-pay | Admitting: *Deleted

## 2018-06-23 NOTE — Addendum Note (Signed)
Addended by: Orlene Erm on: 06/23/2018 08:28 AM   Modules accepted: Orders

## 2018-06-23 NOTE — Telephone Encounter (Signed)
-----   Message from Adelina Mings, MD sent at 06/22/2018  5:32 PM EST ----- Please add avocado IgE to the patient's labs. Thanks.

## 2018-06-23 NOTE — Telephone Encounter (Signed)
Added lab given to [place] to add

## 2018-06-25 ENCOUNTER — Encounter: Payer: Self-pay | Admitting: Family Medicine

## 2018-06-25 LAB — IGE+ALLERGENS ZONE 2(30)

## 2018-06-25 LAB — ALLERGEN PROFILE, SHELLFISH
Clam IgE: <0.1 kU/L
F023-IgE Crab: <0.1 kU/L
F080-IgE Lobster: <0.1 kU/L
F290-IgE Oyster: <0.1 kU/L
Scallop IgE: <0.1 kU/L
Shrimp IgE: <0.1 kU/L

## 2018-06-25 LAB — IGE PEANUT COMPONENT PROFILE
F352-IgE Ara h 8: <0.1 kU/L
F422-IgE Ara h 1: <0.1 kU/L
F423-IgE Ara h 2: <0.1 kU/L
F424-IgE Ara h 3: <0.1 kU/L
F427-IgE Ara h 9: <0.1 kU/L
F447-IgE Ara h 6: <0.1 kU/L

## 2018-06-25 LAB — ALLERGEN, PEANUT F13

## 2018-06-25 LAB — IGE NUT PROF. W/COMPONENT RFLX
Brazil Nut IgE: <0.1 kU/L
F020-IgE Almond: <0.1 kU/L
F202-IgE Cashew Nut: <0.1 kU/L
F203-IgE Pistachio Nut: <0.1 kU/L
Hazelnut (Filbert) IgE: <0.1 kU/L
Macadamia Nut, IgE: <0.1 kU/L
Peanut, IgE: <0.1 kU/L
Pecan Nut IgE: <0.1 kU/L
Walnut IgE: <0.1 kU/L

## 2018-06-25 LAB — SPECIMEN STATUS REPORT

## 2018-06-25 LAB — ALLERGEN BANANA: Allergen Banana IgE: <0.1 kU/L

## 2018-06-25 LAB — ALLERGEN PEACH F95: Allergen, Peach f95: <0.1 kU/L

## 2018-06-25 LAB — F255-IGE PLUM: F255-IgE Plum: <0.1 kU/L

## 2018-06-25 LAB — ALLERGEN AVOCADO F96: F096-IgE Avocado: <0.1 kU/L

## 2018-06-25 LAB — F094-IGE PEAR: Allergen Pear IgE: <0.1 kU/L

## 2018-06-30 DIAGNOSIS — Z888 Allergy status to other drugs, medicaments and biological substances status: Secondary | ICD-10-CM | POA: Diagnosis not present

## 2018-06-30 DIAGNOSIS — Z01419 Encounter for gynecological examination (general) (routine) without abnormal findings: Secondary | ICD-10-CM | POA: Diagnosis not present

## 2018-06-30 DIAGNOSIS — Z91040 Latex allergy status: Secondary | ICD-10-CM | POA: Diagnosis not present

## 2018-06-30 DIAGNOSIS — Z124 Encounter for screening for malignant neoplasm of cervix: Secondary | ICD-10-CM | POA: Diagnosis not present

## 2018-07-01 ENCOUNTER — Other Ambulatory Visit: Payer: Self-pay | Admitting: *Deleted

## 2018-07-01 DIAGNOSIS — Z124 Encounter for screening for malignant neoplasm of cervix: Secondary | ICD-10-CM | POA: Diagnosis not present

## 2018-07-01 DIAGNOSIS — Z91040 Latex allergy status: Secondary | ICD-10-CM | POA: Diagnosis not present

## 2018-07-01 DIAGNOSIS — Z888 Allergy status to other drugs, medicaments and biological substances status: Secondary | ICD-10-CM | POA: Diagnosis not present

## 2018-07-01 DIAGNOSIS — Z01419 Encounter for gynecological examination (general) (routine) without abnormal findings: Secondary | ICD-10-CM | POA: Diagnosis not present

## 2018-07-01 MED ORDER — EPINEPHRINE 0.3 MG/0.3ML IJ SOAJ: 0.3000 mg | Freq: Once | INTRAMUSCULAR | 2 refills | Status: AC

## 2018-07-01 NOTE — Telephone Encounter (Signed)
Patient called stating that they needed Auvi-Q prescription sent in to [facility]. Prescription has been sent in per confirmation of office notes from office visit on 06/22/18. Advised patient if there are any questions or issues to please call. Patient verbalized understanding.

## 2018-07-10 DIAGNOSIS — Z1231 Encounter for screening mammogram for malignant neoplasm of breast: Secondary | ICD-10-CM | POA: Diagnosis not present

## 2018-08-20 ENCOUNTER — Encounter: Payer: Self-pay | Admitting: Family Medicine

## 2018-08-20 ENCOUNTER — Ambulatory Visit: Payer: BLUE CROSS/BLUE SHIELD | Admitting: Family Medicine

## 2018-08-20 VITALS — BP 111/78 | HR 88 | Temp 98.2°F | Resp 16

## 2018-08-20 DIAGNOSIS — J069 Acute upper respiratory infection, unspecified: Secondary | ICD-10-CM | POA: Diagnosis not present

## 2018-08-20 DIAGNOSIS — J209 Acute bronchitis, unspecified: Secondary | ICD-10-CM

## 2018-08-20 MED ORDER — AZITHROMYCIN 250 MG PO TABS: ORAL_TABLET | ORAL | 0 refills | Status: DC

## 2018-08-20 MED ORDER — PREDNISONE 20 MG PO TABS: ORAL_TABLET | ORAL | 0 refills | Status: DC

## 2018-08-20 NOTE — Progress Notes (Signed)
OFFICE VISIT  08/20/2018   CC:  Chief Complaint  Patient presents with  . URI   HPI:    Patient is a 58 y.o. Caucasian female who presents for respiratory complaints. Onset >72h ago: scratchy throat, then malaise, nasal congestion/runny nose, PND, then coughing a lot. Benadryl helped some last night with runny nose/PND. No fever.  No body aches, no nausea, no diarrhea. Her URIs have always tended to get severe and cause bronchitis/SOB, possibly having to do with scarring in lungs from remote hx of RT when she had Wilm's tumor as a 58 yr old.  Hx of metastatic colon cancer: At this time she has no evidence of recurrence/mets in the last 2 yrs or so, most recent imaging 05/2018.  Past Medical History:  Diagnosis Date  . Allergy    Full allergy blood testing panel (environmental + foods) NORMAL 06/2018 (Dr. Verlin Fester).  . Anemia   . Arthritis    rt, shoulder  . Chronic renal insufficiency, stage 3 (moderate) (HCC)    Stage II/III (GFR 50s-60s).  No proteinuria.  ARB added 12/2010 by nephrologist for renal protection.  Cr 1.1, GFR 55 on labs with onc 07/15/17.  . Colon cancer ([place]) 08/2014   (METASTATIC TO LIVER 2018)--Initial dx 09/2014-Low anterior resection of rectum-laparoscopic, + chemo X about 8 cycles with dose reductions for toxicity.  2018--CEA continued to rise so PET scan done; liver lesion showed up. Partial hepatectomy 12/2016.  f/u imaging 07/16/17--no signn of recurrence.  Prominence of left adenexa noted so onc ordered pelvic u/s.  Marland Kitchen Gout    Uric acid level decreased from 9 to 4.1 with addition of uloric 40 mg qd (02/2011).  Marland Kitchen History of anemia   . History of blood transfusion 1963  . History of nephrectomy, unilateral 1964   Wilms tumor at age 89 (right kidney)  . History of subacute thyroiditis   . HTN (hypertension)    since age 87.  Also hx of PIH.  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  Marland Kitchen Pancreatic cyst 2016/17/18   Stable and benign-appearing on f/u imaging as of 08/2015.  Stable on CT w/contrast 07/16/17.  Marland Kitchen Postmenopausal   . Right shoulder pain 2017   Dr. Mardelle Matte: pt did well with injection and PT.  Adhesive capsulitis vs cuff pathology: another injection by Dr. Mardelle Matte 05/20/16 helped x 2 wks.  MRI is next step: pt is considering this.  . Seborrheic dermatitis    Face; consider contact derm (around eyes); Lyndle Herrlich 09/2012--desonide 0.05% cream rx'd  . Solitary pulmonary nodule 2016/17   Stable on f/u CT imaging as of 10/2015.  Stable/unchanged 4 mm nodule RML on CT chest w/contrast 07/16/17.    Past Surgical History:  Procedure Laterality Date  . APPENDECTOMY  1964  . BREAST REDUCTION SURGERY  2003   bilateral  . [place]  . COLON SURGERY  09/2014   [facility]  . COLONOSCOPY N/A 08/17/2014   Procedure: COLONOSCOPY;  Surgeon: Ladene Artist, MD;  Location: [facility];  Service: Endoscopy;  Laterality: N/A;  . COLONOSCOPY  01/2018   Polypectomy--tubular adenoma with NO high grade dysplasia.  Repeat 1-2 yrs for surveillance.  . COLONOSCOPY W/ POLYPECTOMY  11/12/2016   Multiple polyps,some adenomatous but no high grade dysplasia: recall 1 yr for surveillance.  Marland Kitchen LAPAROSCOPIC PARTIAL HEPATECTOMY  12/11/2016  .  liver mass excision    . low anter resect rectum  09/12/14   for colon cancer; [facility], [name]  . LUMBAR LAMINECTOMY  2004   microdiscectomy  . LUMBAR LAMINECTOMY/DECOMPRESSION MICRODISCECTOMY Left 05/21/2013   Procedure: LUMBAR LAMINECTOMY MICRODISCECTOMY L5-S1 LEFT ;  Surgeon: Tobi Bastos, MD;  Location: [facility];  Service: Orthopedics;  Laterality: Left;  . NEPHRECTOMY  1964   Wilms tumor (left)  . POLYPECTOMY      Outpatient Medications Prior to Visit  Medication Sig Dispense Refill  . acetaminophen (TYLENOL) 500 MG tablet Take 1,000 mg by mouth every 6 (six) hours as  needed for pain.    . calcium carbonate (TUMS - DOSED IN MG ELEMENTAL CALCIUM) 500 MG chewable tablet Chew 1 tablet by mouth daily as needed for indigestion or heartburn.    . cetirizine (ZYRTEC) 10 MG chewable tablet Chew 10 mg by mouth as needed for allergies.    Marland Kitchen colchicine 0.6 MG tablet Take 1 tablet (0.6 mg total) by mouth 2 (two) times daily as needed. for gout flare 60 tablet 2  . EPINEPHrine (AUVI-Q) 0.3 mg/0.3 mL IJ SOAJ injection Use as directed for severe allergic reaction 2 Device 2  . febuxostat (ULORIC) 40 MG tablet Take 1 tablet (40 mg total) by mouth every morning. 90 tablet 3  . fluticasone (FLONASE) 50 MCG/ACT nasal spray Use 2 spray per nostril daily as needed 16 g 5  . irbesartan (AVAPRO) 150 MG tablet Take 1 tablet (150 mg total) by mouth daily. 90 tablet 3  . Olopatadine HCl (PAZEO) 0.7 % SOLN Place 1 drop into both eyes daily. 1 Bottle 5  . triamcinolone ointment (KENALOG) 0.1 % APP EXT AA BID  0  . valACYclovir (VALTREX) 1000 MG tablet TAKE 2 TABLETS BY MOUTH EVERY 12 HOURS AS 2 DOSES (Patient not taking: Reported on 04/03/2018) 4 tablet 6  . 0.9 %  sodium chloride infusion      No facility-administered medications prior to visit.     Allergies  Allergen Reactions  . Aldomet [Methyldopa]     Severe bone suppression  . Latex Rash    Blisters, rash  . Banana Itching  . Benzthiazide Other (See Comments)    HCTZ increased gout flares  . Kiwi Extract Other (See Comments)    Scratchy throat and itchy ear  . Other Other (See Comments)    Scratchy throat and itchy ear Nectarine- itchy throat Kiwi by allergy test  dermabond- vascular reaction  . Peach [Prunus Persica] Itching  . Plum Pulp Itching  . Thiazide-Type Diuretics Other (See Comments)    HCTZ increased gout flares  . Crab [Shellfish Allergy] Rash  . Tape Rash    ROS As per HPI  PE: Blood pressure 111/78, pulse 88, temperature 98.2 F (36.8 C), temperature source Oral, resp. rate 16, SpO2 99 %.   VS: noted--normal. Gen: alert, NAD, NONTOXIC APPEARING. HEENT: eyes without injection, drainage, or swelling.  Ears: EACs clear, TMs with normal light reflex and landmarks.  Nose: Clear rhinorrhea, with some dried, crusty exudate adherent to mildly injected mucosa.  No purulent d/c.  No paranasal sinus TTP.  No facial swelling.  Throat and mouth without focal lesion.  No pharyngial swelling, erythema, or exudate.   Neck: supple, no LAD.   LUNGS: CTA bilat, nonlabored resps.   CV: RRR, no m/r/g. EXT: no c/c/e SKIN: no rash   LABS:    Chemistry      Component Value Date/Time  NA 137 07/15/2017 1036   K 4.5 07/15/2017 1036   CL 105 08/01/2014 1135   CO2 24 08/01/2014 1135   BUN 23 (A) 07/15/2017 1036   CREATININE 1.1 07/15/2017 1036   CREATININE 1.30 (H) 08/11/2016 1416   GLU 94 07/15/2017 1036      Component Value Date/Time   CALCIUM 9.0 08/01/2014 1135   ALKPHOS 118 07/15/2017 1036   AST 25 07/15/2017 1036   ALT 21 07/15/2017 1036   BILITOT 0.3 08/01/2014 1135       IMPRESSION AND PLAN:  URI with cough, likely a component of acute bronchitis, but no RAD. She has hx of severe/prolonged URIs/bronchitis over the years when things start out like this. Discussed approach to treatment today, decided to give rx's for prednisone 20mg  qd x 5d AND azith 500mg  qd x 3d to take IF she is not feeling better in 2 days. Call or return if feeling worse OR if new symptoms. Signs/symptoms to call or return for were reviewed and pt expressed understanding.  An After Visit Summary was printed and given to the patient.  FOLLOW UP: Return if symptoms worsen or fail to improve.  Signed:  Crissie Sickles, MD           08/20/2018

## 2018-08-20 NOTE — Patient Instructions (Signed)
Fill your prednisone and azithromycin prescriptions if not feeling improvement in 2 days.

## 2018-08-22 ENCOUNTER — Other Ambulatory Visit: Payer: Self-pay | Admitting: Family Medicine

## 2018-08-24 NOTE — Telephone Encounter (Signed)
Patient requesting RF of valtrex.   Patient states that she got a cold sore on Saturday.  Okay for RF?  Not currently in her medication list.   Please advise.

## 2018-09-02 ENCOUNTER — Other Ambulatory Visit: Payer: Self-pay | Admitting: Family Medicine

## 2018-09-02 NOTE — Telephone Encounter (Signed)
Pt is over due for f/u [facility], will send Rx for #90 w/ 0RF. Needs office visit for more refills.   Pt advised but seemed put off by this since she was just here. I advised her that we did not address any of her chronic issues at her last visit and that we can not refill her medications based of of acute visits only. She needs to have routine f/u's with Dr. Anitra Lauth. She voiced understanding apt made for 10/12/18 at 9:45am.

## 2018-09-08 DIAGNOSIS — E278 Other specified disorders of adrenal gland: Secondary | ICD-10-CM | POA: Diagnosis not present

## 2018-09-08 DIAGNOSIS — C787 Secondary malignant neoplasm of liver and intrahepatic bile duct: Secondary | ICD-10-CM | POA: Diagnosis not present

## 2018-09-08 DIAGNOSIS — C189 Malignant neoplasm of colon, unspecified: Secondary | ICD-10-CM | POA: Diagnosis not present

## 2018-09-08 DIAGNOSIS — K862 Cyst of pancreas: Secondary | ICD-10-CM | POA: Diagnosis not present

## 2018-09-08 DIAGNOSIS — N281 Cyst of kidney, acquired: Secondary | ICD-10-CM | POA: Diagnosis not present

## 2018-09-08 DIAGNOSIS — C19 Malignant neoplasm of rectosigmoid junction: Secondary | ICD-10-CM | POA: Diagnosis not present

## 2018-09-08 DIAGNOSIS — R918 Other nonspecific abnormal finding of lung field: Secondary | ICD-10-CM | POA: Diagnosis not present

## 2018-09-15 ENCOUNTER — Other Ambulatory Visit: Payer: Self-pay | Admitting: Family Medicine

## 2018-09-16 NOTE — Telephone Encounter (Signed)
RF request for uloric LOV: 10/09/16 Next ov: 10/12/18 Last written: 08/06/17 #90 w/ 3RF  Please advise. Thanks.

## 2018-10-12 ENCOUNTER — Ambulatory Visit: Payer: BLUE CROSS/BLUE SHIELD | Admitting: Family Medicine

## 2018-10-12 ENCOUNTER — Encounter: Payer: Self-pay | Admitting: Family Medicine

## 2018-10-12 VITALS — BP 125/78 | HR 82 | Temp 97.9°F | Resp 16 | Ht 65.0 in | Wt 189.2 lb

## 2018-10-12 DIAGNOSIS — N183 Chronic kidney disease, stage 3 unspecified: Secondary | ICD-10-CM

## 2018-10-12 DIAGNOSIS — M1000 Idiopathic gout, unspecified site: Secondary | ICD-10-CM | POA: Diagnosis not present

## 2018-10-12 DIAGNOSIS — N2889 Other specified disorders of kidney and ureter: Secondary | ICD-10-CM

## 2018-10-12 DIAGNOSIS — K862 Cyst of pancreas: Secondary | ICD-10-CM

## 2018-10-12 DIAGNOSIS — Z85038 Personal history of other malignant neoplasm of large intestine: Secondary | ICD-10-CM | POA: Diagnosis not present

## 2018-10-12 DIAGNOSIS — N281 Cyst of kidney, acquired: Secondary | ICD-10-CM

## 2018-10-12 DIAGNOSIS — I10 Essential (primary) hypertension: Secondary | ICD-10-CM

## 2018-10-12 MED ORDER — IRBESARTAN 150 MG PO TABS: 150.0000 mg | ORAL_TABLET | Freq: Every day | ORAL | 3 refills | Status: DC

## 2018-10-12 NOTE — Progress Notes (Signed)
OFFICE VISIT  10/12/2018   CC:  Chief Complaint  Patient presents with  . Follow-up    RCI, pt is fasting   HPI:    Patient is a 58 y.o. Caucasian female who presents for f/u HTN, CRI with GFR 50s-60s, and gout. She has hx of metastatic colon cancer, followed by [facility] onc/surg. MR abd/panc 05/2018 and 09/08/2018 showed no sign of cancer/mets and showed her pancreatic cysts and kidney cysts stable.  She does have gallstones w/out any sx's to suggest problems from these. Most recent labs from oncology f/u 09/08/2018 reviewed today: all normal CMET, CBC w/diff, and CEA level (see labs section below).   Interim hx: Feeling great. No pain.  Appetite good.    BP always normal at other MD visits and at dentist.   NO home bp monitoring. Compliant with bp med.  No problem with any gout in a decade or so.  Has never had to take colchicine. Takes uloric daily.  CRI: she avoids NSAIDs, has good hydration habits.    Past Medical History:  Diagnosis Date  . Allergy    Full allergy blood testing panel (environmental + foods) NORMAL 06/2018 (Dr. Verlin Fester).  . Anemia   . Arthritis    rt, shoulder  . Chronic renal insufficiency, stage 3 (moderate) (HCC)    Stage II/III (GFR 50s-60s).  No proteinuria.  ARB added 12/2010 by nephrologist for renal protection.  Cr 1.1, GFR 55 on labs with onc 07/15/17.  . Colon cancer ([place]) 08/2014   (METASTATIC TO LIVER 2018)--Initial dx 09/2014-Low anterior resection of rectum-laparoscopic, + chemo X about 8 cycles with dose reductions for toxicity.  2018--CEA continued to rise so PET scan done; liver lesion showed up. Partial hepatectomy 12/2016.  f/u imaging 07/16/17--no signn of recurrence.  Prominence of left adenexa noted so onc ordered pelvic u/s.  Marland Kitchen Gout    Uric acid level decreased from 9 to 4.1 with addition of uloric 40 mg qd (02/2011).  Marland Kitchen History of anemia   . History of blood transfusion 1963  . 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 History of subacute thyroiditis   . HTN (hypertension)    since age 54.  Also hx of PIH.  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  Marland Kitchen Pancreatic cyst 2016/17/18   Stable and benign-appearing on f/u imaging as of 08/2015.  Stable on CT w/contrast 07/16/17.  Marland Kitchen Postmenopausal   . Right shoulder pain 2017   Dr. Mardelle Matte: pt did well with injection and PT.  Adhesive capsulitis vs cuff pathology: another injection by Dr. Mardelle Matte 05/20/16 helped x 2 wks.  MRI is next step: pt is considering this.  . Seborrheic dermatitis    Face; consider contact derm (around eyes); Lyndle Herrlich 09/2012--desonide 0.05% cream rx'd  . Solitary pulmonary nodule 2016/17   Stable on f/u CT imaging as of 10/2015.  Stable/unchanged 4 mm nodule RML on CT chest w/contrast 07/16/17.    Past Surgical History:  Procedure Laterality Date  . APPENDECTOMY  1964  . BREAST REDUCTION SURGERY  2003   bilateral  . [place]  . COLON SURGERY  09/2014   [facility]  . COLONOSCOPY N/A 08/17/2014   Procedure: COLONOSCOPY;  Surgeon: Ladene Artist, MD;  Location: [facility];  Service: Endoscopy;  Laterality: N/A;  .  COLONOSCOPY  01/2018   Polypectomy--tubular adenoma with NO high grade dysplasia.  Repeat 1-2 yrs for surveillance.  . COLONOSCOPY W/ POLYPECTOMY  11/12/2016   Multiple polyps,some adenomatous but no high grade dysplasia: recall 1 yr for surveillance.  Marland Kitchen LAPAROSCOPIC PARTIAL HEPATECTOMY  12/11/2016  . liver mass excision    . low anter resect rectum  09/12/14   for colon cancer; [facility], [name]  . LUMBAR LAMINECTOMY  2004   microdiscectomy  . LUMBAR LAMINECTOMY/DECOMPRESSION MICRODISCECTOMY Left 05/21/2013   Procedure: LUMBAR LAMINECTOMY MICRODISCECTOMY L5-S1 LEFT ;  Surgeon: Tobi Bastos, MD;  Location: [facility];  Service: Orthopedics;   Laterality: Left;  . NEPHRECTOMY  1964   Wilms tumor (left)  . POLYPECTOMY      Outpatient Medications Prior to Visit  Medication Sig Dispense Refill  . acetaminophen (TYLENOL) 500 MG tablet Take 1,000 mg by mouth every 6 (six) hours as needed for pain.    . calcium carbonate (TUMS - DOSED IN MG ELEMENTAL CALCIUM) 500 MG chewable tablet Chew 1 tablet by mouth daily as needed for indigestion or heartburn.    . cetirizine (ZYRTEC) 10 MG chewable tablet Chew 10 mg by mouth as needed for allergies.    Marland Kitchen colchicine 0.6 MG tablet Take 1 tablet (0.6 mg total) by mouth 2 (two) times daily as needed. for gout flare 60 tablet 2  . EPINEPHrine (AUVI-Q) 0.3 mg/0.3 mL IJ SOAJ injection Use as directed for severe allergic reaction 2 Device 2  . febuxostat (ULORIC) 40 MG tablet TAKE 1 TABLET BY MOUTH EVERY MORNING 90 tablet 3  . fluticasone (FLONASE) 50 MCG/ACT nasal spray Use 2 spray per nostril daily as needed 16 g 5  . Olopatadine HCl (PAZEO) 0.7 % SOLN Place 1 drop into both eyes daily. 1 Bottle 5  . valACYclovir (VALTREX) 1000 MG tablet TAKE 2 TABLETS BY MOUTH EVERY 12 HOURS AS 2 DOSES. 4 tablet 6  . azithromycin (ZITHROMAX) 250 MG tablet 2 tabs po qd x 3d (Patient not taking: Reported on 10/12/2018) 6 tablet 0  . irbesartan (AVAPRO) 150 MG tablet TAKE 1 TABLET BY MOUTH DAILY 90 tablet 0  . predniSONE (DELTASONE) 20 MG tablet 1 tab po qd x 5d (Patient not taking: Reported on 10/12/2018) 5 tablet 0   No facility-administered medications prior to visit.     Allergies  Allergen Reactions  . Aldomet [Methyldopa]     Severe bone suppression  . Latex Rash    Blisters, rash  . Banana Itching  . Benzthiazide Other (See Comments)    HCTZ increased gout flares  . Kiwi Extract Other (See Comments)    Scratchy throat and itchy ear  . Other Other (See Comments)    Scratchy throat and itchy ear Nectarine- itchy throat Kiwi by allergy test  dermabond- vascular reaction  . Peach [Prunus Persica] Itching   . Plum Pulp Itching  . Thiazide-Type Diuretics Other (See Comments)    HCTZ increased gout flares  . Crab [Shellfish Allergy] Rash  . Tape Rash    ROS As per HPI  PE: Blood pressure 125/78, pulse 82, temperature 97.9 F (36.6 C), temperature source Oral, resp. rate 16, height 5\' 5"  (1.651 m), weight 189 lb 3.2 oz (85.8 kg), SpO2 98 %. Gen: Alert, well appearing.  Patient is oriented to person, place, time, and situation. AFFECT: pleasant, lucid thought and speech. CV: RRR but occ ectopic beat noted, no m/r/g.   LUNGS: CTA bilat, nonlabored resps, good aeration in all  lung fields.   LABS:    Chemistry      Component Value Date/Time   NA 137 07/15/2017 1036   K 4.5 07/15/2017 1036   CL 105 08/01/2014 1135   CO2 24 08/01/2014 1135   BUN 23 (A) 07/15/2017 1036   CREATININE 1.1 07/15/2017 1036   CREATININE 1.30 (H) 08/11/2016 1416   GLU 94 07/15/2017 1036      Component Value Date/Time   CALCIUM 9.0 08/01/2014 1135   ALKPHOS 118 07/15/2017 1036   AST 25 07/15/2017 1036   ALT 21 07/15/2017 1036   BILITOT 0.3 08/01/2014 1135     Lab Results  Component Value Date   LABURIC 4.8 08/01/2014   LABS: Results for orders placed or performed in visit on 09/08/18 (from the past 24 hour(s))  Carcinoembryonic Ag (CEA)  Collection Time: 09/08/18 11:45 AM  Result Value Ref Range  CEA 1.2 0.0 - 5.0 NG/ML  Results for orders placed or performed in visit on 09/08/18 (from the past 24 hour(s))  Comprehensive Metabolic Panel  Collection Time: 09/08/18 7:45 AM  Result Value Ref Range  Sodium 138 135 - 146 MMOL/L  Potassium 3.9 3.5 - 5.3 MMOL/L  Chloride 109 98 - 110 MMOL/L  CO2 25 23 - 30 MMOL/L  BUN 23 8 - 24 MG/DL  Glucose 99 70 - 99 MG/DL  Creatinine 1.23 0.50 - 1.50 MG/DL  Calcium 8.8 8.5 - 10.5 MG/DL  Total Protein 6.9 6.0 - 8.3 G/DL  Albumin 3.9 3.5 - 5.0 G/DL  Total Bilirubin 0.9 0.1 - 1.2 MG/DL  Alkaline Phosphatase 76 25 - 125 IU/L or U/L  AST (SGOT) 22 5 - 40 IU/L  or U/L  ALT (SGPT) 20 5 - 50 IU/L or U/L  Anion Gap 4 4 - 14 MMOL/L  Est. GFR Non-African American 48 (L) >=60 ML/MIN/1.73 M*2  CBC and Differential  Collection Time: 09/08/18 7:45 AM  Result Value Ref Range  WBC 5.2 4.8 - 10.8 x 10*3/uL  RBC 4.18 (L) 4.20 - 5.40 x 10*6/uL  Hemoglobin 12.9 12.0 - 16.0 G/DL  Hematocrit 38.2 37.0 - 47.0 %  MCV 91.4 81.0 - 99.0 FL  MCH 30.8 27.0 - 31.0 PG  MCHC 33.7 33.0 - 37.0 G/DL  RDW 13.4 11.5 - 14.5 %  Platelets 159 (L) 160 - 360 X 10*3/uL  MPV 8.2 6.8 - 10.2 FL  Neutrophil % 64 %  Lymphocyte % 27 %  Monocyte % 6 %  Eosinophil % 2 %  Basophil % 1 %  Neutrophil Absolute 3.3 1.6 - 7.3 x 10*3/uL  Lymphocyte Absolute 1.4 1.0 - 5.1 x 10*3/uL  Monocyte Absolute 0.3 0.1 - 0.9 x 10*3/uL  Eosinophil Absolute 0.1 0.0 - 0.5 x 10*3/uL  Basophil Absolute 0.1 0.0 - 0.2 x 10*3/uL  NRBC Manual 0 <=0 / 100 WBC  nRBC   Lab Results  Component Value Date  CEA 1.2 09/08/2018    CT C/A/P with contrast 09/08/2018: Impression  1. Status post low anterior resection for adenocarcinoma without evidence of local recurrence or metastatic disease. 2. 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 3. Stable 1.1 cm left adrenal nodule previously characterized as adenoma. 4. 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  5. Cholelithiasis without evidence of cholecystitis. No evidence of biliary obstruction.  IMPRESSION AND PLAN:  1) HTN: The current medical regimen is effective;  continue present plan and medications. Lytes/cr stable 09/08/2018.  2)  Gout: no flares since being on daily uloric 40mg  qd.  3) Hx of metastatic colon ca: doing well as of most recent oncology f/u 09/08/2018. Reviewed imaging and lab results today. She'll keep up with regular surveillance f/u's with oncology service at [facility].  4) CRI III: has hx of nephroctomy for Wilm's tumor as a child. Remaining kidney with stable  cysts. Renal function/electrolytes stable/good 09/08/2018. Continue to avoid/minimize use of NSAIDs and continue good hydration.  5) Pancreatic cyst: stable on f/u imaging, most recently 09/08/2018.  An After Visit Summary was printed and given to the patient.  FOLLOW UP: Return in about 6 months (around 04/14/2019) for routine chronic illness f/u.  Signed:  Crissie Sickles, MD           10/12/2018

## 2018-10-15 IMAGING — MR MR PELVIS WO/W CM
12 of 27 series · 18 of 48 positions shown · IV contrast (multihance)
Comparison: MRI 08/23/2015 and 12/21/2014

CLINICAL DATA: Restaging colon cancer.

EXAM:
MRI ABDOMEN WITHOUT AND WITH CONTRAST. MR PELVIS WITHOUT WITH
CONTRAST.
TECHNIQUE: Multiplanar multisequence MR imaging of the abdomen was performed
both before and after the administration of intravenous contrast.
CONTRAST:  6mL MULTIHANCE GADOBENATE DIMEGLUMINE 529 MG/ML IV SOLN

[Series 4: T2 · sagittal · 5.0mm · 0.59mm/px · 2 of 36 slices shown (1 of 5)]
[im 1/36]
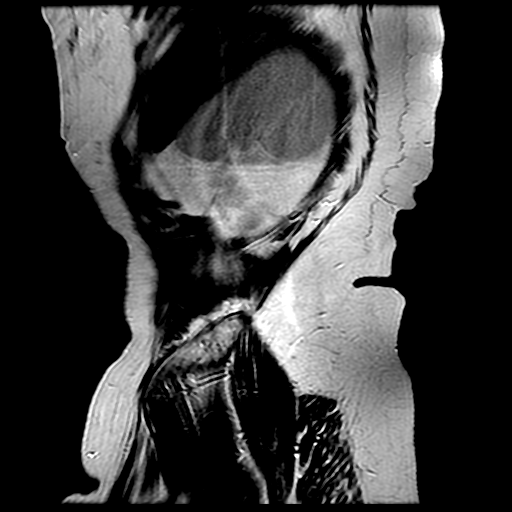
[im 36/36]
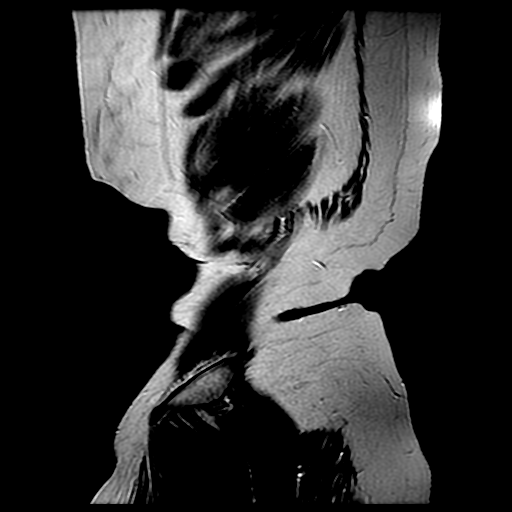

[Series 5: T2 · axial · 5.0mm · 0.55mm/px · z∈[-124,+209]mm · 2 of 57 slices shown (2 of 5)]
[im 1/57]
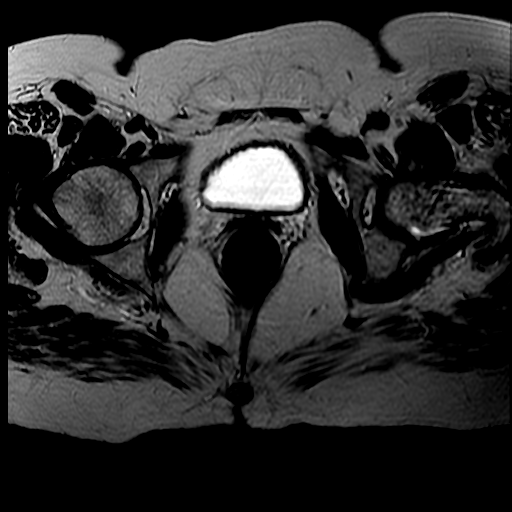
[im 57/57]
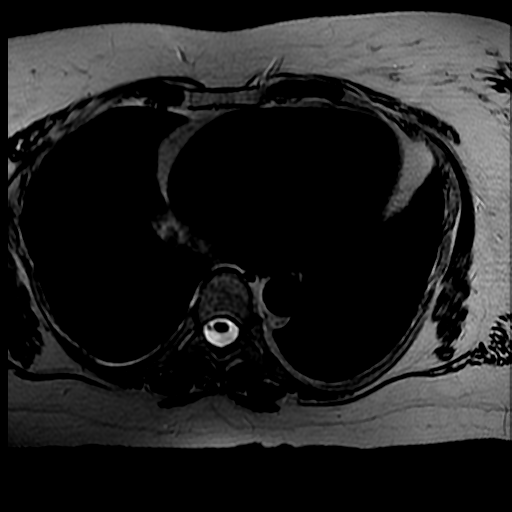

[Series 6: T2 · axial · 5.0mm · 0.55mm/px · z∈[-124,+209]mm · 2 of 57 slices shown (3 of 5)]
[im 1/57]
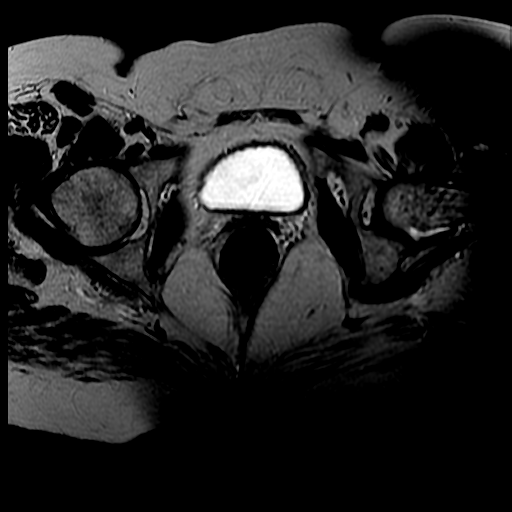
[im 57/57]
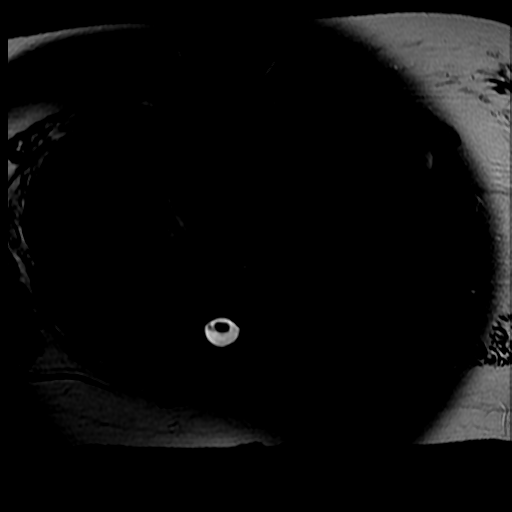

[Series 7: T2 · coronal · 5.0mm · 0.61mm/px · 1 of 33 slices shown (4 of 5)]
[im 1/33]
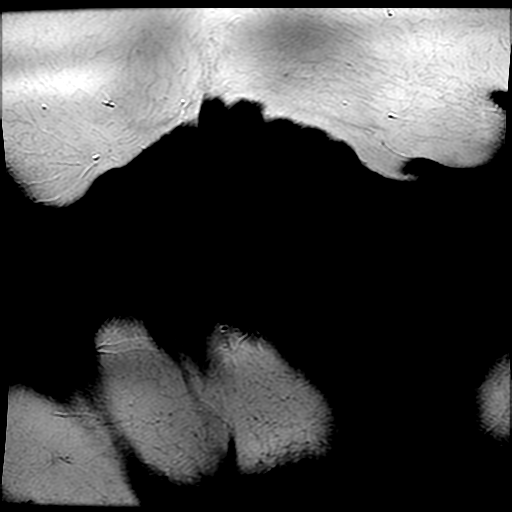

[Series 8: T1 · axial · 5.0mm · 0.55mm/px · 1 of 57 slices shown]
[im 1/57]
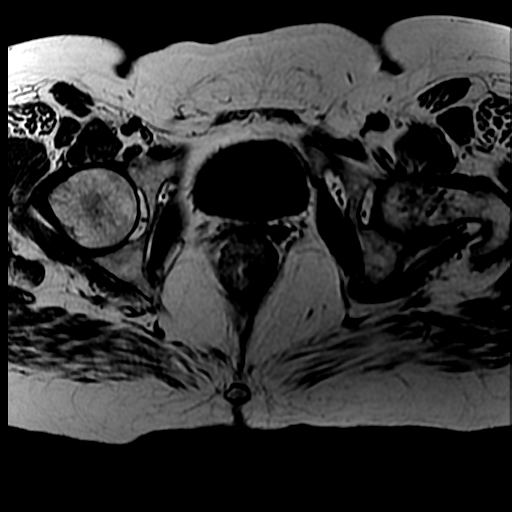

[Series 9: T1 fat-sat · axial · 5.0mm · 0.55mm/px · 1 of 57 slices shown]
[im 1/57]
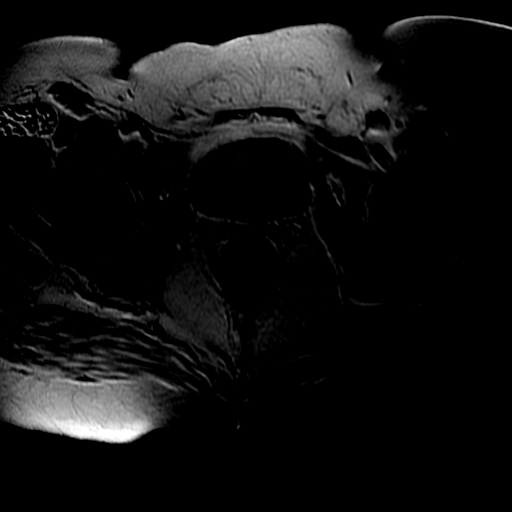

[Series 10: T2 fat-sat · axial · 5.0mm · 0.55mm/px · 1 of 57 slices shown]
[im 1/57]
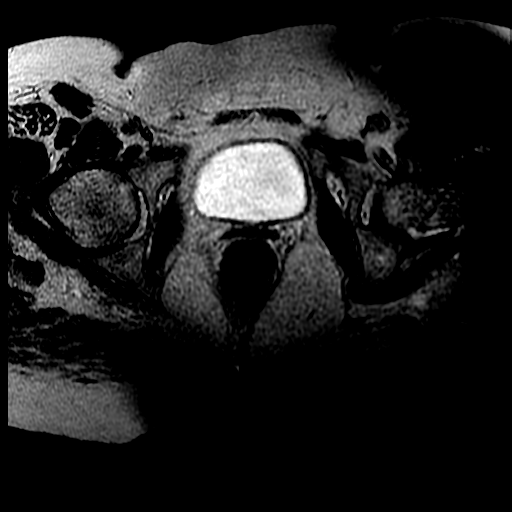

[Series 13: ax dualecho · axial · 5.0mm · 0.78mm/px · z∈[-129,+199]mm · 3 of 134 slices shown]
[im 1/134]
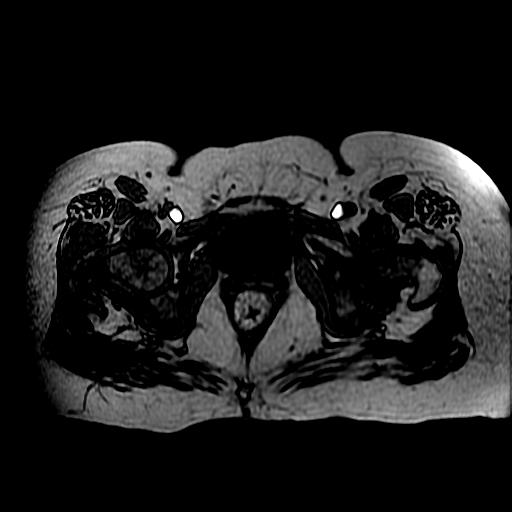
[im 67/134]
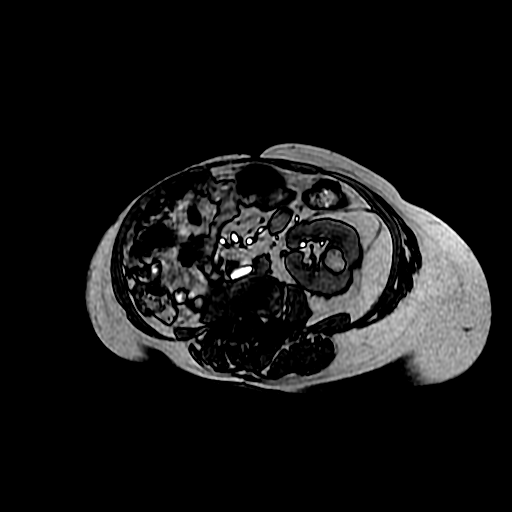
[im 134/134]
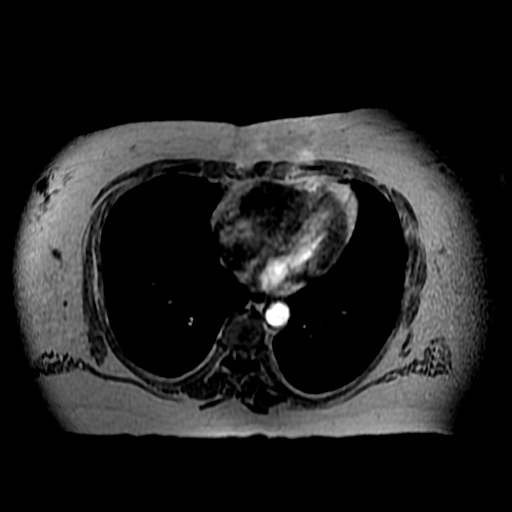

[Series 14: T2 · coronal · 5.0mm · 0.78mm/px · 1 of 38 slices shown (5 of 5)]
[im 1/38]
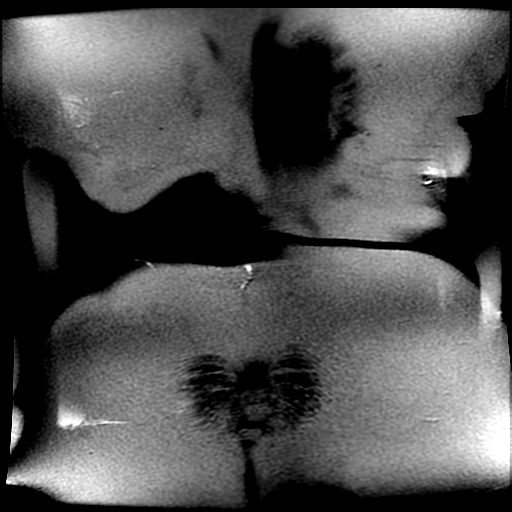

[Series 15: bSSFP fat-sat · axial · 10.0mm · 0.94mm/px · 1 of 34 slices shown]
[im 1/34]
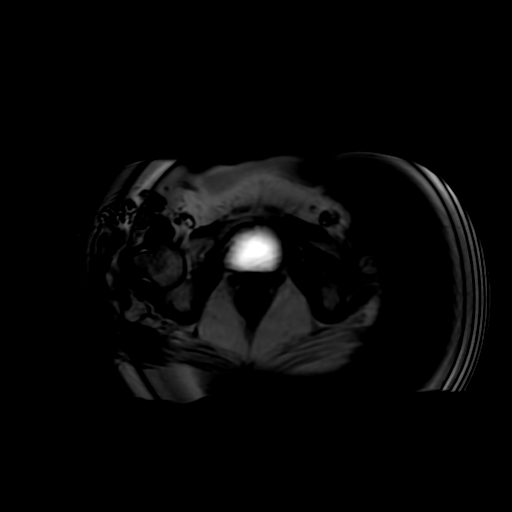

[Series 19: T1 fat-sat post-contrast · axial · 5.0mm · 0.55mm/px · 1 of 56 slices shown]
[im 1/56]
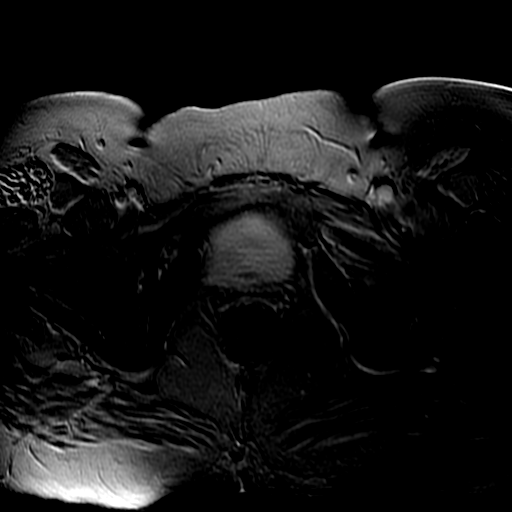

[Series 1600: T1 dynamic · axial · 6.0mm · 0.78mm/px · z∈[-110,+149]mm · 2 of 88 slices shown]
[im 1/88]
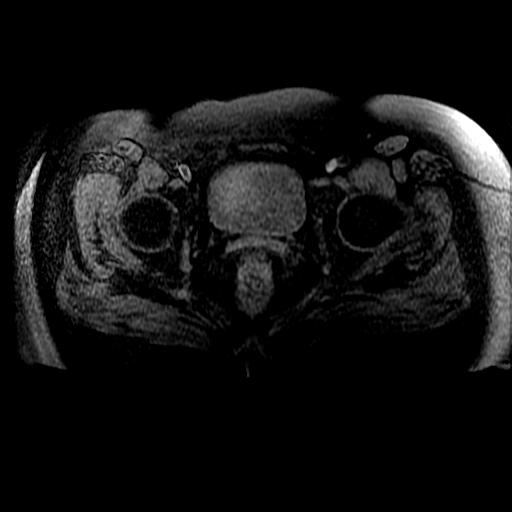
[im 88/88]
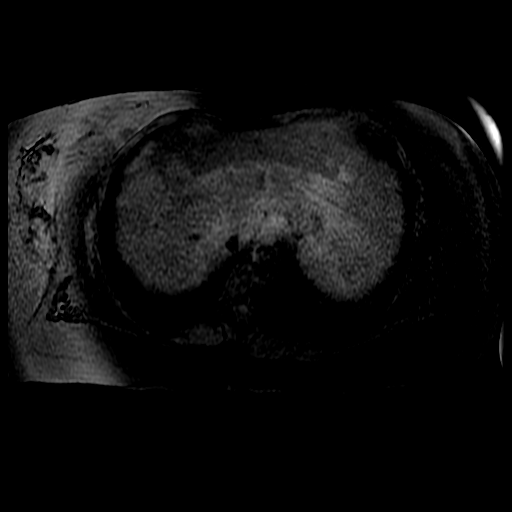

[18 of 48 positions shown; findings below may reference images not displayed]

FINDINGS: Lower chest: The lung bases are grossly clear. No worrisome lesions,
pleural or pericardial effusion. The distal esophagus is grossly
normal.

Hepatobiliary: No focal hepatic lesions or intrahepatic biliary
dilatation. The portal and hepatic veins are patent. The gallbladder
demonstrates layering high T1 signal intensity which area which is
likely inspissated bile or cholesterol laden bile. No findings for
acute cholecystitis. Mild stable common bile duct dilatation.

Pancreas: Stable small cystic lesions in the pancreas. No worrisome
nodularity or enhancement. No ductal dilatation.

Spleen:  Normal size.  No focal lesions.

Adrenals/Urinary Tract: Stable small left adrenal gland nodule
consistent with benign adenoma. The right adrenal gland is normal.

The right kidney is absent. The left kidney demonstrates a multi
septated cystic lesion in the upper pole. This measures 7.5 x
cm. No worrisome soft tissue nodularity or enhancement. This is most
likely an adult type multilocular cystic nephroma.

Stable midpole fatty lesion measuring 23 mm consistent with
angiomyolipoma.

Stomach/Bowel: The stomach, duodenum, the small bowel and colon are
unremarkable. No inflammatory changes, mass lesions or obstructive
findings.

Vascular/Lymphatic: No pathologically enlarged lymph nodes
identified. No abdominal aortic aneurysm demonstrated.

Other: No pelvic mass or adenopathy. Small amount of free pelvic
fluid. The uterus and ovaries are unremarkable except for a few
small uterine fibroids. The bladder is normal. No free pelvic fluid
collections. No inguinal mass or adenopathy. No abdominal wall
hernia or subcutaneous lesions.

Musculoskeletal: No significant bony findings.
IMPRESSION: 1. Solitary left kidney with 2 renal lesions. Stable large multi
septated cystic lesion, most likely a multilocular cystic nephroma.
Stable midpole lesion consistent with angiomyolipoma.
2. Stable pancreatic cysts.
3. No findings for hepatic metastatic disease or abdominal/pelvic
lymphadenopathy.
4. No pelvic mass or adenopathy.  Small amount of free pelvic fluid.

## 2019-01-12 DIAGNOSIS — Z87891 Personal history of nicotine dependence: Secondary | ICD-10-CM | POA: Diagnosis not present

## 2019-01-12 DIAGNOSIS — R918 Other nonspecific abnormal finding of lung field: Secondary | ICD-10-CM | POA: Diagnosis not present

## 2019-01-12 DIAGNOSIS — R1909 Other intra-abdominal and pelvic swelling, mass and lump: Secondary | ICD-10-CM | POA: Diagnosis not present

## 2019-01-12 DIAGNOSIS — N2889 Other specified disorders of kidney and ureter: Secondary | ICD-10-CM | POA: Diagnosis not present

## 2019-01-12 DIAGNOSIS — C189 Malignant neoplasm of colon, unspecified: Secondary | ICD-10-CM | POA: Diagnosis not present

## 2019-01-12 DIAGNOSIS — Z8505 Personal history of malignant neoplasm of liver: Secondary | ICD-10-CM | POA: Diagnosis not present

## 2019-01-12 DIAGNOSIS — C19 Malignant neoplasm of rectosigmoid junction: Secondary | ICD-10-CM | POA: Diagnosis not present

## 2019-01-12 DIAGNOSIS — C787 Secondary malignant neoplasm of liver and intrahepatic bile duct: Secondary | ICD-10-CM | POA: Diagnosis not present

## 2019-01-12 DIAGNOSIS — K862 Cyst of pancreas: Secondary | ICD-10-CM | POA: Diagnosis not present

## 2019-01-12 DIAGNOSIS — Z85038 Personal history of other malignant neoplasm of large intestine: Secondary | ICD-10-CM | POA: Diagnosis not present

## 2019-04-15 ENCOUNTER — Ambulatory Visit: Payer: BLUE CROSS/BLUE SHIELD | Admitting: Family Medicine

## 2019-04-20 ENCOUNTER — Other Ambulatory Visit: Payer: Self-pay

## 2019-04-20 ENCOUNTER — Encounter: Payer: Self-pay | Admitting: Family Medicine

## 2019-04-20 ENCOUNTER — Ambulatory Visit (INDEPENDENT_AMBULATORY_CARE_PROVIDER_SITE_OTHER): Payer: BC Managed Care – PPO | Admitting: Family Medicine

## 2019-04-20 VITALS — BP 124/81 | HR 78 | Wt 183.0 lb

## 2019-04-20 DIAGNOSIS — N183 Chronic kidney disease, stage 3 unspecified: Secondary | ICD-10-CM

## 2019-04-20 DIAGNOSIS — I10 Essential (primary) hypertension: Secondary | ICD-10-CM

## 2019-04-20 MED ORDER — IRBESARTAN 150 MG PO TABS: 150.0000 mg | ORAL_TABLET | Freq: Every day | ORAL | 3 refills | Status: DC

## 2019-04-20 MED ORDER — FEBUXOSTAT 40 MG PO TABS: 40.0000 mg | ORAL_TABLET | Freq: Every morning | ORAL | 3 refills | Status: DC

## 2019-04-20 NOTE — Progress Notes (Signed)
Virtual Visit via Video Note  I connected with on 04/20/19 at  8:00 AM EDT by a video enabled telemedicine application and verified that I am speaking with the correct person using two identifiers.  Location patient: home Location provider:work or home office Persons participating in the virtual visit: patient, provider  I discussed the limitations of evaluation and management by telemedicine and the availability of in person appointments. The patient expressed understanding and agreed to proceed.   HPI: 58 y/o WF being seen today for f/u HTN and CRI. She has hx of metastatic colon cancer, followed by [facility] onc/surg. MR abd/panc 05/2018 and 09/08/2018 showed no sign of cancer/mets and showed her pancreatic cysts and kidney cysts stable.  She does have gallstones w/out any sx's to suggest problems from these.  Interim hx: Feeling great. No complaints. Home bp's normal. Treadmill for exercise, staying at home all the time b/c very scared of coronavirus b/c she is high risk. All f/u with oncol UTD, most recent 01/12/19-->CT C/A/P all stable, no sign of recurrence of colon ca or mets, all labs "normal" per pt, which is what onc told her.  No records available to me at this time. She avoids NSAIDs.  Pays good attention to hydration status.  ROS: no CP, no SOB, no wheezing, no cough, no dizziness, no HAs, no rashes, no melena/hematochezia.  No polyuria or polydipsia.  No myalgias or arthralgias.   Past Medical History:  Diagnosis Date  . Allergy    Full allergy blood testing panel (environmental + foods) NORMAL 06/2018 (Dr. Verlin Fester).  . Anemia   . Arthritis    rt, shoulder  . Asymptomatic cholelithiasis 2019/2020  . Chronic renal insufficiency, stage 3 (moderate) (HCC)    Stage II/III (GFR 50s-60s).  No proteinuria.  ARB added 12/2010 by nephrologist for renal protection.  Cr 1.1, GFR 55 on labs with onc 07/15/17.  . Colon cancer ([place]) 08/2014   (METASTATIC TO LIVER 2018)--Initial dx  09/2014-Low anterior resection of rectum-laparoscopic, + chemo X about 8 cycles with dose reductions for toxicity.  2018--CEA continued to rise so PET scan done; liver lesion showed up. Partial hepatectomy 12/2016.  f/u imaging 07/16/17--no signn of recurrence.  Prominence of left adenexa noted so onc ordered pelvic u/s.  Marland Kitchen Gout    Uric acid level decreased from 9 to 4.1 with addition of uloric 40 mg qd (02/2011).  Marland Kitchen History of anemia   . History of blood transfusion 1963  . History of nephrectomy, unilateral 1964   Wilms tumor at age 30 (right kidney)  . History of subacute thyroiditis   . HTN (hypertension)    since age 76.  Also hx of PIH.  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  . 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  Stable on CT w/contrast 07/16/17, 05/2018, and 09/08/2018.  Marland Kitchen Postmenopausal   . Right shoulder pain 2017   Dr. Mardelle Matte: pt did well with injection and PT.  Adhesive capsulitis vs cuff pathology: another injection by Dr. Mardelle Matte 05/20/16 helped x 2 wks.  MRI is next step: pt is considering this.  . Seborrheic dermatitis    Face; consider contact derm (around eyes); Lyndle Herrlich 09/2012--desonide 0.05% cream rx'd  . Solitary pulmonary nodule 2016/17   Stable on f/u CT imaging as of 10/2015.  Stable/unchanged 4 mm nodule RML on CT chest w/contrast 07/16/17.    Past Surgical History:  Procedure Laterality Date  . APPENDECTOMY  1964  . BREAST REDUCTION SURGERY  2003   bilateral  . [place]  . COLON SURGERY  09/2014   [facility]  . COLONOSCOPY N/A 08/17/2014   Procedure: COLONOSCOPY;  Surgeon: Ladene Artist, MD;  [facility];  Service: Endoscopy;  Laterality: N/A;  . COLONOSCOPY  01/2018   Polypectomy--tubular adenoma with NO high  grade dysplasia.  Repeat 1-2 yrs for surveillance.  . COLONOSCOPY W/ POLYPECTOMY  11/12/2016   Multiple polyps,some adenomatous but no high grade dysplasia: recall 1 yr for surveillance.  Marland Kitchen LAPAROSCOPIC PARTIAL HEPATECTOMY  12/11/2016  . liver mass excision    . low anter resect rectum  09/12/14   for colon cancer; [facility], [name]  . LUMBAR LAMINECTOMY  2004   microdiscectomy  . LUMBAR LAMINECTOMY/DECOMPRESSION MICRODISCECTOMY Left 05/21/2013   Procedure: LUMBAR LAMINECTOMY MICRODISCECTOMY L5-S1 LEFT ;  Surgeon: Tobi Bastos, MD;  Location: [facility];  Service: Orthopedics;  Laterality: Left;  . NEPHRECTOMY  1964   Wilms tumor (left)  . POLYPECTOMY      Family History  Problem Relation Age of Onset  . Hypertension Father   . Heart disease Father   . Alzheimer's disease Father   . Crohn's disease Father   . Colon cancer Father        diagnosed 64's  . Hypertension Mother   . Heart disease Mother   . COPD Mother   . Colon polyps Sister   . Colon polyps Brother   . Esophageal cancer Neg Hx   . Rectal cancer Neg Hx   . Stomach cancer Neg Hx      Current Outpatient Medications:  .  acetaminophen (TYLENOL) 500 MG tablet, Take 1,000 mg by mouth every 6 (six) hours as needed for pain., Disp: , Rfl:  .  calcium carbonate (TUMS - DOSED IN MG ELEMENTAL CALCIUM) 500 MG chewable tablet, Chew 1 tablet by mouth daily as needed for indigestion or heartburn., Disp: , Rfl:  .  EPINEPHrine (AUVI-Q) 0.3 mg/0.3 mL IJ SOAJ injection, Use as directed for severe allergic reaction, Disp: 2 Device, Rfl: 2 .  febuxostat (ULORIC) 40 MG tablet, Take 1 tablet (40 mg total) by mouth every morning., Disp: 90 tablet, Rfl: 3 .  irbesartan (AVAPRO) 150 MG tablet, Take 1 tablet (150 mg total) by mouth daily., Disp: 90 tablet, Rfl: 3 .  prednisoLONE acetate (PRED FORTE) 1 % ophthalmic suspension, daily. , Disp: , Rfl:  .  valACYclovir (VALTREX) 1000 MG tablet, TAKE 2 TABLETS BY MOUTH EVERY 12 HOURS AS 2 DOSES.,  Disp: 4 tablet, Rfl: 6 .  cetirizine (ZYRTEC) 10 MG chewable tablet, Chew 10 mg by mouth as needed for allergies., Disp: , Rfl:  .  colchicine 0.6 MG tablet, Take 1 tablet (0.6 mg total) by mouth 2 (two) times daily as needed. for gout flare (Patient not taking: Reported on 04/20/2019), Disp: 60 tablet, Rfl: 2 .  fluticasone (FLONASE) 50 MCG/ACT nasal spray, Use 2 spray per nostril daily as needed (Patient not taking: Reported on 04/20/2019), Disp: 16 g, Rfl: 5 .  Olopatadine HCl (PAZEO) 0.7 % SOLN, Place 1 drop into both eyes daily. (Patient not taking: Reported on 04/20/2019), Disp: 1 Bottle, Rfl: 5  EXAM:  VITALS per patient if applicable: BP [id number] (BP Location: Left Arm, Patient Position: Sitting, Cuff Size: Large)  Pulse 78   Wt 183 lb (83 kg)   SpO2 98%   BMI 30.45 kg/m    GENERAL: alert, oriented, appears well and in no acute distress  HEENT: atraumatic, conjunttiva clear, no obvious abnormalities on inspection of external nose and ears  NECK: normal movements of the head and neck  LUNGS: on inspection no signs of respiratory distress, breathing rate appears normal, no obvious gross SOB, gasping or wheezing  CV: no obvious cyanosis  MS: moves all visible extremities without noticeable abnormality  PSYCH/NEURO: pleasant and cooperative, no obvious depression or anxiety, speech and thought processing grossly intact  LABS: none today  Lab Results  Component Value Date   TSH 1.46 08/01/2014   Lab Results  Component Value Date   WBC 5.0 07/15/2017   HGB 12.2 07/15/2017   HCT 38 07/15/2017   MCV 78.8 08/31/2014   PLT 187 07/15/2017   Lab Results  Component Value Date   CREATININE 1.1 07/15/2017   BUN 23 (A) 07/15/2017   NA 137 07/15/2017   K 4.5 07/15/2017   CL 105 08/01/2014   CO2 24 08/01/2014   Lab Results  Component Value Date   ALT 21 07/15/2017   AST 25 07/15/2017   ALKPHOS 118 07/15/2017   BILITOT 0.3 08/01/2014   Lab Results  Component Value Date    LABURIC 4.8 08/01/2014   ASSESSMENT AND PLAN:  Discussed the following assessment and plan:  1) HTN: The current medical regimen is effective;  continue present plan and medications. RF'd irbestartan 150mg  qd, #90, RF x 3 today. Continue daily treadmill, low Na diet, home bp monitoring.  2) CRI III: avoids NSAIDs, hydrates well. Recent lytes/cr monitoring via oncologist "normal" per pt report. Will get records.  3) Preventative health care: PAP and mammogram normal 07/2018 per her report (she has a GYN MD through [facility]) and will have routine pap/mammo again 07/2019.  I discussed the assessment and treatment plan with the patient. The patient was provided an opportunity to ask questions and all were answered. The patient agreed with the plan and demonstrated an understanding of the instructions.   The patient was advised to call back or seek an in-person evaluation if the symptoms worsen or if the condition fails to improve as anticipated.  F/u: 6 mo  Signed:  Crissie Sickles, MD           04/20/2019

## 2019-05-08 ENCOUNTER — Ambulatory Visit (INDEPENDENT_AMBULATORY_CARE_PROVIDER_SITE_OTHER): Payer: BC Managed Care – PPO

## 2019-05-08 DIAGNOSIS — Z23 Encounter for immunization: Secondary | ICD-10-CM

## 2019-08-13 DIAGNOSIS — I493 Ventricular premature depolarization: Secondary | ICD-10-CM

## 2019-08-13 HISTORY — DX: Ventricular premature depolarization: I49.3

## 2019-08-17 DIAGNOSIS — R911 Solitary pulmonary nodule: Secondary | ICD-10-CM | POA: Diagnosis not present

## 2019-08-17 DIAGNOSIS — C787 Secondary malignant neoplasm of liver and intrahepatic bile duct: Secondary | ICD-10-CM | POA: Diagnosis not present

## 2019-08-17 DIAGNOSIS — Z905 Acquired absence of kidney: Secondary | ICD-10-CM | POA: Diagnosis not present

## 2019-08-17 DIAGNOSIS — R918 Other nonspecific abnormal finding of lung field: Secondary | ICD-10-CM | POA: Diagnosis not present

## 2019-08-17 DIAGNOSIS — C187 Malignant neoplasm of sigmoid colon: Secondary | ICD-10-CM | POA: Diagnosis not present

## 2019-08-17 DIAGNOSIS — C189 Malignant neoplasm of colon, unspecified: Secondary | ICD-10-CM | POA: Diagnosis not present

## 2019-08-17 DIAGNOSIS — C19 Malignant neoplasm of rectosigmoid junction: Secondary | ICD-10-CM | POA: Diagnosis not present

## 2019-08-17 DIAGNOSIS — K862 Cyst of pancreas: Secondary | ICD-10-CM | POA: Diagnosis not present

## 2019-10-18 ENCOUNTER — Telehealth: Payer: Self-pay | Admitting: Allergy and Immunology

## 2019-10-18 NOTE — Telephone Encounter (Signed)
Patient called and wants to know about the covid shot [phone].

## 2019-10-18 NOTE — Telephone Encounter (Signed)
Attempted to call patient. No answer. Busy signal.

## 2019-10-19 NOTE — Telephone Encounter (Signed)
Called patient and went over the algorithm for the COVID vaccine. Patient denied any reactions or facial fillers. Advised to patient that she should be safe getting the Moderna vaccine. Patient verbalized understanding.

## 2019-12-02 ENCOUNTER — Other Ambulatory Visit: Payer: Self-pay | Admitting: Family Medicine

## 2019-12-02 MED ORDER — IRBESARTAN 150 MG PO TABS: 150.0000 mg | ORAL_TABLET | Freq: Every day | ORAL | 0 refills | Status: DC

## 2019-12-16 ENCOUNTER — Encounter: Payer: Self-pay | Admitting: Gastroenterology

## 2020-01-11 DIAGNOSIS — Z1231 Encounter for screening mammogram for malignant neoplasm of breast: Secondary | ICD-10-CM | POA: Diagnosis not present

## 2020-02-08 ENCOUNTER — Other Ambulatory Visit: Payer: Self-pay

## 2020-02-08 ENCOUNTER — Ambulatory Visit (AMBULATORY_SURGERY_CENTER): Payer: Self-pay

## 2020-02-08 VITALS — Ht 65.0 in | Wt 186.2 lb

## 2020-02-08 DIAGNOSIS — Z1211 Encounter for screening for malignant neoplasm of colon: Secondary | ICD-10-CM

## 2020-02-08 DIAGNOSIS — Z85038 Personal history of other malignant neoplasm of large intestine: Secondary | ICD-10-CM

## 2020-02-08 MED ORDER — NA SULFATE-K SULFATE-MG SULF 17.5-3.13-1.6 GM/177ML PO SOLN: 1.0000 | Freq: Once | ORAL | 0 refills | Status: AC

## 2020-02-08 NOTE — Progress Notes (Signed)
No allergies to soy or egg Pt is not on blood thinners or diet pills Denies issues with sedation/intubation Denies atrial flutter/fib Denies constipation   Pt is aware of Covid safety and care partner requirements.      

## 2020-02-17 ENCOUNTER — Encounter: Payer: Self-pay | Admitting: Gastroenterology

## 2020-02-23 ENCOUNTER — Ambulatory Visit (AMBULATORY_SURGERY_CENTER): Payer: BC Managed Care – PPO | Admitting: Gastroenterology

## 2020-02-23 ENCOUNTER — Other Ambulatory Visit: Payer: Self-pay

## 2020-02-23 ENCOUNTER — Encounter: Payer: Self-pay | Admitting: Gastroenterology

## 2020-02-23 VITALS — BP 117/75 | HR 67 | Temp 96.8°F | Resp 17 | Ht 65.0 in | Wt 186.0 lb

## 2020-02-23 DIAGNOSIS — Z85038 Personal history of other malignant neoplasm of large intestine: Secondary | ICD-10-CM

## 2020-02-23 DIAGNOSIS — D124 Benign neoplasm of descending colon: Secondary | ICD-10-CM

## 2020-02-23 MED ORDER — SODIUM CHLORIDE 0.9 % IV SOLN: 500.0000 mL | INTRAVENOUS | Status: DC

## 2020-02-23 NOTE — Patient Instructions (Signed)
YOU HAD AN ENDOSCOPIC PROCEDURE TODAY AT THE [facility]:   Refer to the procedure report that was given to you for any specific questions about what was found during the examination.  If the procedure report does not answer your questions, please call your gastroenterologist to clarify.  If you requested that your care partner not be given the details of your procedure findings, then the procedure report has been included in a sealed envelope for you to review at your convenience later.  YOU SHOULD EXPECT: Some feelings of bloating in the abdomen. Passage of more gas than usual.  Walking can help get rid of the air that was put into your GI tract during the procedure and reduce the bloating. If you had a lower endoscopy (such as a colonoscopy or flexible sigmoidoscopy) you may notice spotting of blood in your stool or on the toilet paper. If you underwent a bowel prep for your procedure, you may not have a normal bowel movement for a few days.  Please Note:  You might notice some irritation and congestion in your nose or some drainage.  This is from the oxygen used during your procedure.  There is no need for concern and it should clear up in a day or so.  SYMPTOMS TO REPORT IMMEDIATELY:   Following lower endoscopy (colonoscopy or flexible sigmoidoscopy):  Excessive amounts of blood in the stool  Significant tenderness or worsening of abdominal pains  Swelling of the abdomen that is new, acute  Fever of 100F or higher    For urgent or emergent issues, a gastroenterologist can be reached at any hour by calling [phone]. Do not use MyChart messaging for urgent concerns.    DIET:  We do recommend a small meal at first, but then you may proceed to your regular diet.  Drink plenty of fluids but you should avoid alcoholic beverages for 24 hours.  ACTIVITY:  You should plan to take it easy for the rest of today and you should NOT DRIVE or use heavy machinery until tomorrow  (because of the sedation medicines used during the test).    FOLLOW UP: Our staff will call the number listed on your records 48-72 hours following your procedure to check on you and address any questions or concerns that you may have regarding the information given to you following your procedure. If we do not reach you, we will leave a message.  We will attempt to reach you two times.  During this call, we will ask if you have developed any symptoms of COVID 19. If you develop any symptoms (ie: fever, flu-like symptoms, shortness of breath, cough etc.) before then, please call [phone].  If you test positive for Covid 19 in the 2 weeks post procedure, please call and report this information to us.    If any biopsies were taken you will be contacted by phone or by letter within the next 1-3 weeks.  Please call us at [phone] if you have not heard about the biopsies in 3 weeks.    SIGNATURES/CONFIDENTIALITY: You and/or your care partner have signed paperwork which will be entered into your electronic medical record.  These signatures attest to the fact that that the information above on your After Visit Summary has been reviewed and is understood.  Full responsibility of the confidentiality of this discharge information lies with you and/or your care-partner.   Resume medications. Information given on polyps and hemorrhoids. 

## 2020-02-23 NOTE — Op Note (Signed)
[facility] Patient Name: Elaine Jenkins Procedure Date: 02/23/2020 8:26 AM MRN: [id number] Endoscopist: Ladene Artist , MD Age: 59 Referring MD:  Date of Birth: [birth year] Gender: Female Account #: [id number] Procedure:                Colonoscopy Indications:              High risk colon cancer surveillance: Personal                            history of colon cancer Medicines:                Monitored Anesthesia Care Procedure:                Pre-Anesthesia Assessment:                           - Prior to the procedure, a History and Physical                            was performed, and patient medications and                            allergies were reviewed. The patient's tolerance of                            previous anesthesia was also reviewed. The risks                            and benefits of the procedure and the sedation                            options and risks were discussed with the patient.                            All questions were answered, and informed consent                            was obtained. Prior Anticoagulants: The patient has                            taken no previous anticoagulant or antiplatelet                            agents. ASA Grade Assessment: II - A patient with                            mild systemic disease. After reviewing the risks                            and benefits, the patient was deemed in                            satisfactory condition to undergo the procedure.  After obtaining informed consent, the colonoscope                            was passed under direct vision. Throughout the                            procedure, the patient's blood pressure, pulse, and                            oxygen saturations were monitored continuously. The                            Colonoscope was introduced through the anus and                            advanced to the the cecum, identified by                             appendiceal orifice and ileocecal valve. The                            ileocecal valve, appendiceal orifice, and rectum                            were photographed. The quality of the bowel                            preparation was good. The colonoscopy was somewhat                            difficult due to restricted mobility of the colon.                            Consider pediatric colonoscope for next                            colonoscopy. The patient tolerated the procedure                            well. Scope In: 8:34:26 AM Scope Out: 8:47:49 AM Scope Withdrawal Time: 0 hours 12 minutes 3 seconds  Total Procedure Duration: 0 hours 13 minutes 23 seconds  Findings:                 The perianal and digital rectal examinations were                            normal.                           Two sessile polyps were found in the descending                            colon. The polyps were 6 to 9 mm in size. These  polyps were removed with a cold snare. Resection                            and retrieval were complete.                           There was a small lipoma, 15 mm in diameter, at the                            hepatic flexure.                           There was evidence of a prior end-to-side                            colo-colonic anastomosis in the sigmoid colon. This                            was patent and was characterized by healthy                            appearing mucosa. The anastomosis was traversed.                           Internal hemorrhoids were found during                            retroflexion. The hemorrhoids were small and Grade                            I (internal hemorrhoids that do not prolapse).                           The exam was otherwise without abnormality on                            direct and retroflexion views. Complications:            No immediate complications.  Estimated blood loss:                            None. Estimated Blood Loss:     Estimated blood loss: none. Impression:               - Two 6 to 9 mm polyps in the descending colon,                            removed with a cold snare. Resected and retrieved.                           - Lipoma at hepatic flexure.                           - Patent end-to-side colo-colonic anastomosis,  characterized by healthy appearing mucosa.                           - Internal hemorrhoids.                           - The examination was otherwise normal on direct                            and retroflexion views. Recommendation:           - Repeat colonoscopy in 2 years for surveillance.                           - Patient has a contact number available for                            emergencies. The signs and symptoms of potential                            delayed complications were discussed with the                            patient. Return to normal activities tomorrow.                            Written discharge instructions were provided to the                            patient.                           - Resume previous diet.                           - Continue present medications.                           - Await pathology results. Ladene Artist, MD 02/23/2020 8:54:04 AM This report has been signed electronically.

## 2020-02-23 NOTE — Progress Notes (Signed)
Report to PACU, RN, vss, BBS= Clear.  

## 2020-02-23 NOTE — Progress Notes (Signed)
Vs CW I have reviewed the patient's medical history in detail and updated the computerized patient record.   

## 2020-02-23 NOTE — Progress Notes (Signed)
Called to room to assist during endoscopic procedure.  Patient ID and intended procedure confirmed with present staff. Received instructions for my participation in the procedure from the performing physician.  

## 2020-02-25 ENCOUNTER — Telehealth: Payer: Self-pay | Admitting: *Deleted

## 2020-02-25 NOTE — Telephone Encounter (Signed)
  Follow up Call-  Call back number 02/23/2020 01/15/2018  Post procedure Call Back phone  # [phone]  Permission to leave phone message Yes Yes  Some recent data might be hidden     Patient questions:  Do you have a fever, pain , or abdominal swelling? No. Pain Score  0 *  Have you tolerated food without any problems? Yes.    Have you been able to return to your normal activities? Yes.    Do you have any questions about your discharge instructions: Diet   No. Medications  No. Follow up visit  No.  Do you have questions or concerns about your Care? No.  Actions: * If pain score is 4 or above: No action needed, pain <4.  1. Have you developed a fever since your procedure? no  2.   Have you had an respiratory symptoms (SOB or cough) since your procedure? no  3.   Have you tested positive for COVID 19 since your procedure no  4.   Have you had any family members/close contacts diagnosed with the COVID 19 since your procedure? no   If yes to any of these questions please route to Joylene John, RN and Erenest Rasher, RN

## 2020-02-29 DIAGNOSIS — K8020 Calculus of gallbladder without cholecystitis without obstruction: Secondary | ICD-10-CM | POA: Diagnosis not present

## 2020-02-29 DIAGNOSIS — Z85048 Personal history of other malignant neoplasm of rectum, rectosigmoid junction, and anus: Secondary | ICD-10-CM | POA: Diagnosis not present

## 2020-02-29 DIAGNOSIS — K862 Cyst of pancreas: Secondary | ICD-10-CM | POA: Diagnosis not present

## 2020-02-29 DIAGNOSIS — C189 Malignant neoplasm of colon, unspecified: Secondary | ICD-10-CM | POA: Diagnosis not present

## 2020-02-29 DIAGNOSIS — R918 Other nonspecific abnormal finding of lung field: Secondary | ICD-10-CM | POA: Diagnosis not present

## 2020-02-29 DIAGNOSIS — Z91040 Latex allergy status: Secondary | ICD-10-CM | POA: Diagnosis not present

## 2020-02-29 DIAGNOSIS — Z08 Encounter for follow-up examination after completed treatment for malignant neoplasm: Secondary | ICD-10-CM | POA: Diagnosis not present

## 2020-02-29 DIAGNOSIS — Z85038 Personal history of other malignant neoplasm of large intestine: Secondary | ICD-10-CM | POA: Diagnosis not present

## 2020-02-29 DIAGNOSIS — Z9221 Personal history of antineoplastic chemotherapy: Secondary | ICD-10-CM | POA: Diagnosis not present

## 2020-02-29 DIAGNOSIS — D1771 Benign lipomatous neoplasm of kidney: Secondary | ICD-10-CM | POA: Diagnosis not present

## 2020-02-29 DIAGNOSIS — Z800 Family history of malignant neoplasm of digestive organs: Secondary | ICD-10-CM | POA: Diagnosis not present

## 2020-02-29 DIAGNOSIS — Z87891 Personal history of nicotine dependence: Secondary | ICD-10-CM | POA: Diagnosis not present

## 2020-02-29 DIAGNOSIS — C787 Secondary malignant neoplasm of liver and intrahepatic bile duct: Secondary | ICD-10-CM | POA: Diagnosis not present

## 2020-02-29 DIAGNOSIS — Z888 Allergy status to other drugs, medicaments and biological substances status: Secondary | ICD-10-CM | POA: Diagnosis not present

## 2020-02-29 DIAGNOSIS — N281 Cyst of kidney, acquired: Secondary | ICD-10-CM | POA: Diagnosis not present

## 2020-02-29 DIAGNOSIS — N838 Other noninflammatory disorders of ovary, fallopian tube and broad ligament: Secondary | ICD-10-CM | POA: Diagnosis not present

## 2020-03-06 ENCOUNTER — Encounter: Payer: Self-pay | Admitting: Gastroenterology

## 2020-04-15 ENCOUNTER — Other Ambulatory Visit: Payer: Self-pay | Admitting: Family Medicine

## 2020-04-26 ENCOUNTER — Encounter: Payer: Self-pay | Admitting: Family Medicine

## 2020-04-26 ENCOUNTER — Ambulatory Visit: Payer: BC Managed Care – PPO | Admitting: Family Medicine

## 2020-04-26 ENCOUNTER — Telehealth: Payer: Self-pay

## 2020-04-26 ENCOUNTER — Other Ambulatory Visit: Payer: Self-pay

## 2020-04-26 VITALS — BP 145/84 | HR 73 | Temp 97.9°F | Resp 16 | Wt 184.0 lb

## 2020-04-26 DIAGNOSIS — H905 Unspecified sensorineural hearing loss: Secondary | ICD-10-CM

## 2020-04-26 DIAGNOSIS — R079 Chest pain, unspecified: Secondary | ICD-10-CM

## 2020-04-26 DIAGNOSIS — I10 Essential (primary) hypertension: Secondary | ICD-10-CM

## 2020-04-26 DIAGNOSIS — Z23 Encounter for immunization: Secondary | ICD-10-CM

## 2020-04-26 DIAGNOSIS — I498 Other specified cardiac arrhythmias: Secondary | ICD-10-CM | POA: Diagnosis not present

## 2020-04-26 DIAGNOSIS — R9431 Abnormal electrocardiogram [ECG] [EKG]: Secondary | ICD-10-CM | POA: Diagnosis not present

## 2020-04-26 NOTE — Addendum Note (Signed)
Addended by: Tammi Sou on: 04/26/2020 11:21 PM   Modules accepted: Orders

## 2020-04-26 NOTE — Telephone Encounter (Signed)
Spoke with PCP regarding this and ok given to see patient in office tomorrow for evaluation. Advised patient we could see her tomorrow but if symptoms worsen in between now and appt time, she would need to proceed to ED. Voiced understanding  Patient Name: Elaine Jenkins Gender: Female DOB: 05-Mar-1961 Age: 59 Y 9 M 29 D Return Phone Number: [phone] (Primary) Address: City/State/Zip: [place] [address] Client [facility] - Client Client Site [facility] - Day Physician Crissie Sickles - MD Contact Type Call Who Is Calling Patient / Member / Family / Caregiver Call Type Triage / Clinical Relationship To Patient Self Return Phone Number [phone] (Primary) Chief Complaint CHEST PAIN (>=21 years) - pain, pressure, heaviness or tightness Reason for Call Symptomatic / Request for [place] states that she had chest tightness after exercising. Translation No Nurse Assessment Nurse: Rolin Barry, RN, Levada Dy Date/Time (Eastern Time): 04/25/2020 4:58:16 PM Confirm and document reason for call. If symptomatic, describe symptoms. ---Caller is having pressure and tightness after excersining. States that she fells like she was having pressure in her chest. Caller advised that she belched a couple times and thought it was GI. States that she knows that she has gall stones. Advised that she vomited a couple days ago. B/P was 160/84. Pulse was 98% on RA. Has the patient had close contact with a person known or suspected to have the novel coronavirus illness OR traveled / lives in area with major community spread (including international travel) in the last 14 days from the onset of symptoms? * If Asymptomatic, screen for exposure and travel within the last 14 days. ---No Does the patient have any new or worsening symptoms? ---Yes Will a triage be completed? ---Yes Related visit to physician within the last 2 weeks?  ---No Does the PT have any chronic conditions? (i.e. diabetes, asthma, this includes High risk factors for pregnancy, etc.) ---Yes List chronic conditions. ---gallstones HTN One kidney Is this a behavioral health or substance abuse call? ---No Guidelines Guideline Title Affirmed Question Affirmed Notes Nurse Date/Time Eilene Ghazi Time) Chest Pain [1] Chest pain lasts > 5 minutes AND [2] occurred > 3 days ago (72 Powers, RN, Levada Dy 04/25/2020 5:03:19 PMPLEASE NOTE: All timestamps contained within this report are represented as [place] Standard Time. CONFIDENTIALTY NOTICE: This fax transmission is intended only for the addressee. It contains information that is legally privileged, confidential or otherwise protected from use or disclosure. If you are not the intended recipient, you are strictly prohibited from reviewing, disclosing, copying using or disseminating any of this information or taking any action in reliance on or regarding this information. If you have received this fax in error, please notify us immediately by telephone so that we can arrange for its return to [place]. Phone: [phone], Toll-Free: [phone], Fax: [phone] Page: 2 of 2 Call Id: [id number] Guidelines Guideline Title Affirmed Question Affirmed Notes Nurse Date/Time Eilene Ghazi Time) hours) AND [3] NO chest pain or cardiac symptoms now Disp. Time Eilene Ghazi Time) Disposition Final User 04/25/2020 4:57:16 PM Send to Urgent Queue Nicholes Calamity 04/25/2020 5:10:10 PM See PCP within 24 Hours Yes Deaton, RN, Cindee Lame Disagree/Comply Comply Caller Understands Yes PreDisposition Did not know what to do Care Advice Given Per Guideline SEE PCP WITHIN 24 HOURS: * IF OFFICE WILL BE OPEN: You need to be examined within the next 24 hours. Call your doctor (or NP/PA) when the office opens and make an appointment. CALL BACK IF: * Difficulty  breathing or unusual sweating occurs * Chest pain increases in frequency, duration or  severity * Chest pain lasts over 5 minutes * You become worse CARE ADVICE given per Chest Pain (Adult) guideline. Comments User: Saverio Danker, RN Date/Time Eilene Ghazi Time): 04/25/2020 5:10:51 PM Caller advised that she ate a sausage biscuit this am. User: Saverio Danker, RN Date/Time Eilene Ghazi Time): 04/25/2020 5:13:55 PM Caller advised that she was calling to make an appt for in the am. Caller denies any chest pain at this time, caller has no other symptoms at this time. Referrals REFERRED TO PCP OFFICE

## 2020-04-26 NOTE — Progress Notes (Signed)
Office Note 04/26/2020  CC:  Chief Complaint  Patient presents with   Chest Pain    HPI:  Elaine Jenkins is a 59 y.o. White female who is here for "chest tightness". Was doing treadmill and peddler bike with arm movements.  Went to urinate and upon her return she felt a band of tightness around her chest. No SOB, no nausea, no diaphoresis. No palpitations.  No jaw or arm pain.  Lasted about 20 min until it got better, completely abated after approx 45 min. Some belching.   She had last eaten several hours prior, just some m&ms. No abd pain, no abd bloating.     BP check in the midst of this was 168/104, then down to 160/90.  Pulse ox 90-98%.  HR 72-84. No LE swelling or pain.  NO recent immobility or surgical procedures. Was walking treadmill 3 miles/day Jan to June, then got out of habit.  One episode of nausea with vomiting with the feeling of presyncope around 5 AM---last week.  She had not eaten.  Cardiac RF's: HTN, +FH mom and dad.  ROS: no fevers, no wheezing, no cough, no dizziness, no HAs, no rashes, no melena/hematochezia.  No polyuria or polydipsia.  No myalgias or arthralgias.  No focal weakness, paresthesias, or tremors.  No acute vision or hearing abnormalities. No n/v/d or abd pain.  No palpitations.    Past Medical History:  Diagnosis Date   Allergy    Full allergy blood testing panel (environmental + foods) NORMAL 06/2018 (Dr. Verlin Fester).   Anemia    Arthritis    rt, shoulder   Asymptomatic cholelithiasis 2019/2020   Chronic renal insufficiency, stage 3 (moderate)    Stage II/III (GFR 50s-60s).  No proteinuria.  ARB added 12/2010 by nephrologist for renal protection.  Cr 1.1, GFR 55 on labs with onc 07/15/17.   Colon cancer ([place]) 08/2014   (METASTATIC TO LIVER 2018)--Initial dx 09/2014-Low anterior resection of rectum-laparoscopic, + chemo X about 8 cycles with dose reductions for toxicity.  2018--CEA continued to rise so PET scan done; liver lesion showed up.  Partial hepatectomy 12/2016.  f/u imaging 07/16/17--no signn of recurrence.  Prominence of left adenexa noted so onc ordered pelvic u/s.   Gout    Uric acid level decreased from 9 to 4.1 with addition of uloric 40 mg qd (02/2011).   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  Also hx of PIH.   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  Stable on CT w/contrast 07/16/17, 05/2018, and 09/08/2018.   Postmenopausal    Right shoulder pain 2017   Dr. Mardelle Matte: pt did well with injection and PT.  Adhesive capsulitis vs cuff pathology: another injection by Dr. Mardelle Matte 05/20/16 helped x 2 wks.  MRI is next step: pt is considering this.   Seborrheic dermatitis    Face; consider contact derm (around eyes); Lyndle Herrlich 09/2012--desonide 0.05% cream rx'd   Solitary pulmonary nodule 2016/17   Stable on f/u CT imaging as of 10/2015.  Stable/unchanged 4 mm nodule  RML on CT chest w/contrast 07/16/17.    Past Surgical History:  Procedure Laterality Date   APPENDECTOMY  1964   BREAST REDUCTION SURGERY  2003   bilateral   CESAREAN SECTION  1998   COLON SURGERY  09/2014   [facility]   COLONOSCOPY N/A 08/17/2014   Procedure: COLONOSCOPY;  Surgeon: Ladene Artist, MD;  Location: [facility];  Service: Endoscopy;  Laterality: N/A;   COLONOSCOPY  01/2018   Polypectomy--tubular adenoma with NO high grade dysplasia.  Repeat 1-2 yrs for surveillance.   COLONOSCOPY W/ POLYPECTOMY  11/12/2016   Multiple polyps,some adenomatous but no high grade dysplasia: recall 1 yr for  surveillance.   LAPAROSCOPIC PARTIAL HEPATECTOMY  12/11/2016   liver mass excision     LIVER SURGERY     low anter resect rectum  09/12/14   for colon cancer; [facility], [name]   LUMBAR LAMINECTOMY  2004   microdiscectomy   LUMBAR LAMINECTOMY/DECOMPRESSION MICRODISCECTOMY Left 05/21/2013   Procedure: LUMBAR LAMINECTOMY MICRODISCECTOMY L5-S1 LEFT ;  Surgeon: Tobi Bastos, MD;  Location: [facility];  Service: Orthopedics;  Laterality: Left;   NEPHRECTOMY  1964   Wilms tumor (left)   POLYPECTOMY      Family History  Problem Relation Age of Onset   Hypertension Father    Heart disease Father    Alzheimer's disease Father    Crohn's disease Father    Colon cancer Father        diagnosed 85's   Hypertension Mother    Heart disease Mother    COPD Mother    Colon polyps Sister    Colon polyps Brother    Esophageal cancer Neg Hx    Rectal cancer Neg Hx    Stomach cancer Neg Hx     Social History   Socioeconomic History   Marital status: Married    Spouse name: Not on file   Number of children: Not on file   Years of education: Not on file   Highest education level: Not on file  Occupational History   Not on file  Tobacco Use   Smoking status: Former Smoker    Types: Cigarettes   Smokeless tobacco: Never Used   Tobacco comment: light in college  Vaping Use   Vaping Use: Never used  Substance and Sexual Activity   Alcohol use: Yes    Alcohol/week: 1.0 standard drink    Types: 1 Cans of beer per week    Comment: holidays and special occasions; 3-4 times yearly   Drug use: No    Frequency: 1.0 times per week   Sexual activity: Yes  Other Topics Concern   Not on file  Social History Narrative   Married, one child.   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   Relocated to [place] from [place], [place] 2013.    No Tobacco.  Rare alcohol.  No drug use.    Social Determinants of Health   Financial Resource Strain:    Difficulty of Paying  Living Expenses: Not on file  Food Insecurity:    Worried About Charity fundraiser in the Last Year: Not on file   [employer] of Food in the Last Year: Not on file  Transportation Needs:    Lack of Transportation (Medical): Not on file   Lack of Transportation (Non-Medical): Not on file  Physical Activity:    Days of Exercise per Week: Not on file   Minutes of Exercise per Session: Not on file  Stress:    Feeling of Stress : Not on file  Social Connections:    Frequency of Communication with Friends and Family: Not on file   Frequency of Social Gatherings with Friends and Family: Not on file   Attends Religious Services: Not on file   Active Member of Clubs or Organizations: Not on file   Attends Archivist Meetings: Not on file   Marital Status: Not on file  Intimate Partner Violence:    Fear of Current or Ex-Partner: Not on file   Emotionally Abused: Not on file   Physically Abused: Not on file   Sexually Abused: Not on file    Outpatient Medications Prior to Visit  Medication Sig Dispense Refill   acetaminophen (TYLENOL) 500 MG tablet Take 1,000 mg by mouth every 6 (six) hours as needed for pain.     calcium carbonate (TUMS - DOSED IN MG ELEMENTAL CALCIUM) 500 MG chewable tablet Chew 1 tablet by mouth daily as needed for indigestion or heartburn.      colchicine 0.6 MG tablet Take 1 tablet (0.6 mg total) by mouth 2 (two) times daily as needed. for gout flare 60 tablet 2   EPINEPHrine (AUVI-Q) 0.3 mg/0.3 mL IJ SOAJ injection Use as directed for severe allergic reaction 2 Device 2   febuxostat (ULORIC) 40 MG tablet TAKE 1 TABLET BY MOUTH EVERY MORNING 30 tablet 0   irbesartan (AVAPRO) 150 MG tablet Take 1 tablet (150 mg total) by mouth daily. Patient needs office visit for further refills. 90 tablet 0   Olopatadine HCl (PAZEO) 0.7 % SOLN Place 1 drop into both eyes daily. 1 Bottle 5   valACYclovir (VALTREX) 1000 MG tablet TAKE 2 TABLETS BY MOUTH  EVERY 12 HOURS AS 2 DOSES. 4 tablet 6   cetirizine (ZYRTEC) 10 MG chewable tablet Chew 10 mg by mouth as needed for allergies.  (Patient not taking: Reported on 04/26/2020)     No facility-administered medications prior to visit.    Allergies  Allergen Reactions   Aldomet [Methyldopa]     Severe bone suppression   Latex Rash    Blisters, rash   Banana Itching   Benzthiazide Other (See Comments)    HCTZ increased gout flares   Kiwi Extract Other (See Comments)    Scratchy throat and itchy ear   Other Other (See Comments)    Scratchy throat and itchy ear Nectarine- itchy throat Kiwi by allergy test  dermabond- vascular reaction   Peach [Prunus Persica] Itching   Plum Pulp Itching   Thiazide-Type Diuretics Other (See Comments)    HCTZ increased gout flares   Crab [Shellfish Allergy] Rash   Tape Rash    ROS  PE; Vitals with BMI 04/26/2020 02/23/2020 02/23/2020  Height - - -  Weight 184 lbs - -  BMI - - -  Systolic [phone]  Diastolic 84 75 73  Pulse 73 67 73     Gen: Alert, well appearing.  Patient is oriented to person, place, time, and situation. AFFECT: pleasant, lucid thought and speech. EARS: mild amount of cerumen bilat, TMs normal bilat.  Weber lateralizes to "good" ear (right). A>B bilat. CV: RRR, no m/r/g.   LUNGS: CTA bilat, nonlabored resps, good aeration in all lung fields. ABD: soft, NT/ND EXT: no clubbing or cyanosis.  no edema.   Pertinent labs:  Lab Results  Component Value Date   TSH 1.46 08/01/2014   Lab Results  Component Value Date   WBC 5.0  07/15/2017   HGB 12.2 07/15/2017   HCT 38 07/15/2017   MCV 78.8 08/31/2014   PLT 187 07/15/2017   Lab Results  Component Value Date   CREATININE 1.1 07/15/2017   BUN 23 (A) 07/15/2017   NA 137 07/15/2017   K 4.5 07/15/2017   CL 105 08/01/2014   CO2 24 08/01/2014   Lab Results  Component Value Date   ALT 21 07/15/2017   AST 25 07/15/2017   ALKPHOS 118 07/15/2017   BILITOT 0.3  08/01/2014   12 lead ekg today: NSR, rate 72, ventricular trigeminy, isolated TWI in V1.  No ischemic changes, no hypertrophy.  Normal intervals and duration.  Compared to EKG 07/2014 ventricular trigeminy is new.  ASSESSMENT AND PLAN:   1) Atypical CP, abnormal EKG. Cardiac RF's: HTN, FH.   Will refer to cardiology to see if further risk stratification is indicated and see if any concern about her vent trigeminy. She'll return ASAP when fasting to get TSH, FLP, CBC, and CMET.  2) L ear hearing loss, insidious onset 4-5 mo ago. 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 Encouraged audiology eval but at her convenience--not urgent.  Spent 45 min with pt today reviewing HPI, reviewing relevant past history, doing exam, reviewing and discussing lab and imaging data, and formulating plans.  An After Visit Summary was printed and given to the patient.  FOLLOW UP:  No follow-ups on file.  Signed:  Crissie Sickles, MD           04/26/2020

## 2020-04-26 NOTE — Telephone Encounter (Signed)
Patient is requesting same day appointment for chest tightness & medication refill. See triage note.

## 2020-05-01 ENCOUNTER — Ambulatory Visit (INDEPENDENT_AMBULATORY_CARE_PROVIDER_SITE_OTHER): Payer: BC Managed Care – PPO

## 2020-05-01 ENCOUNTER — Other Ambulatory Visit: Payer: Self-pay

## 2020-05-01 DIAGNOSIS — I10 Essential (primary) hypertension: Secondary | ICD-10-CM

## 2020-05-01 DIAGNOSIS — R079 Chest pain, unspecified: Secondary | ICD-10-CM | POA: Diagnosis not present

## 2020-05-01 LAB — COMPREHENSIVE METABOLIC PANEL
ALT: 35 U/L (ref 0–35)
AST: 20 U/L (ref 0–37)
Albumin: 4.2 g/dL (ref 3.5–5.2)
Alkaline Phosphatase: 100 U/L (ref 39–117)
BUN: 27 mg/dL — ABNORMAL HIGH (ref 6–23)
CO2: 25 mEq/L (ref 19–32)
Calcium: 9.5 mg/dL (ref 8.4–10.5)
Chloride: 107 mEq/L (ref 96–112)
Creatinine, Ser: 1.12 mg/dL (ref 0.40–1.20)
GFR: 49.65 mL/min — ABNORMAL LOW (ref >60.00)
Glucose, Bld: 99 mg/dL (ref 70–99)
Potassium: 4.4 mEq/L (ref 3.5–5.1)
Sodium: 140 mEq/L (ref 135–145)
Total Bilirubin: 0.6 mg/dL (ref 0.2–1.2)
Total Protein: 6.5 g/dL (ref 6.0–8.3)

## 2020-05-01 LAB — CBC WITH DIFFERENTIAL/PLATELET
Basophils Absolute: 0 10*3/uL (ref 0.0–0.1)
Basophils Relative: 1 % (ref 0.0–3.0)
Eosinophils Absolute: 0.1 10*3/uL (ref 0.0–0.7)
Eosinophils Relative: 2.8 % (ref 0.0–5.0)
HCT: 39.7 % (ref 36.0–46.0)
Hemoglobin: 13 g/dL (ref 12.0–15.0)
Lymphocytes Relative: 30.3 % (ref 12.0–46.0)
Lymphs Abs: 1.5 10*3/uL (ref 0.7–4.0)
MCHC: 32.7 g/dL (ref 30.0–36.0)
MCV: 95.1 fl (ref 78.0–100.0)
Monocytes Absolute: 0.4 10*3/uL (ref 0.1–1.0)
Monocytes Relative: 7.7 % (ref 3.0–12.0)
Neutro Abs: 2.8 10*3/uL (ref 1.4–7.7)
Neutrophils Relative %: 58.2 % (ref 43.0–77.0)
Platelets: 179 10*3/uL (ref 150.0–400.0)
RBC: 4.17 Mil/uL (ref 3.87–5.11)
RDW: 13.6 % (ref 11.5–15.5)
WBC: 4.8 10*3/uL (ref 4.0–10.5)

## 2020-05-01 LAB — LIPID PANEL
Cholesterol: 158 mg/dL (ref 0–200)
HDL: 53.3 mg/dL (ref >39.00)
LDL Cholesterol: 91 mg/dL (ref 0–99)
NonHDL: 104.54
Total CHOL/HDL Ratio: 3
Triglycerides: 70 mg/dL (ref 0.0–149.0)
VLDL: 14 mg/dL (ref 0.0–40.0)

## 2020-05-01 LAB — TSH: TSH: 2.37 u[IU]/mL (ref 0.35–4.50)

## 2020-05-03 ENCOUNTER — Other Ambulatory Visit: Payer: Self-pay | Admitting: Family Medicine

## 2020-05-20 ENCOUNTER — Other Ambulatory Visit: Payer: Self-pay | Admitting: Family Medicine

## 2020-05-22 ENCOUNTER — Telehealth: Payer: Self-pay

## 2020-05-22 MED ORDER — FEBUXOSTAT 40 MG PO TABS
40.0000 mg | ORAL_TABLET | Freq: Every morning | ORAL | 0 refills | Status: DC
Start: 2020-05-22 — End: 2020-08-21

## 2020-05-22 MED ORDER — IRBESARTAN 150 MG PO TABS
150.0000 mg | ORAL_TABLET | Freq: Every day | ORAL | 0 refills | Status: DC
Start: 2020-05-22 — End: 2020-08-20

## 2020-05-22 NOTE — Addendum Note (Signed)
Addended by: Octaviano Glow on: 05/22/2020 11:44 AM   Modules accepted: Orders

## 2020-05-22 NOTE — Telephone Encounter (Signed)
Rx sent to pharmacy   

## 2020-05-22 NOTE — Telephone Encounter (Signed)
Patient is requesting refills of  febuxostat (ULORIC) 40 MG tablet & irbesartan (AVAPRO) 150 MG tablet.

## 2020-05-23 NOTE — Progress Notes (Signed)
Cardiology Office Note:    Date:  05/26/2020   ID:  Elaine Jenkins, DOB Mar 21, 1961, MRN [id number]  PCP:  Tammi Sou, MD  [facility] Cardiologist:  No primary care provider on file.  [facility] Electrophysiologist:  None   Referring MD: Tammi Sou, MD    History of Present Illness:    Elaine Jenkins is a 59 y.o. 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 Anitra Lauth for evaluation of chest tightness and ventricular trigeminy.  Dr. Idelle Leech note dated 04/26/20 reviewed. The patient reported chest tightness after exercising on her treadmill and peddler bike. No associated SOB, nausea or diaphoresis. The pain was "band-like" and did not radiate. No associated jaw or arm pain. Lasted about 20 minutes at that intensity until improving. Pain was completely gone within 45 minutes. Denies any abdominal pain, LE edema, orthopnea or PND.  BP was 168/100 and then 140s afer several minutes. Saw PCP where ECG in the office showed ventricular trigmeniny and patient was referred here for further management.  Patient states that she had one other episode of chest pain about 1.5 years ago when she woke from sleep with pressure on her chest. Lasted 10-15 minutes and went away with deep breathing. Did not occur again until this recent event. No symptoms during exercise. Chest pain only occurred after working out and the episode during sleep. No LE edema, no orthopnea, no PND.  Has history of frequent PVCs throughout her medical work-up for her cancer. States that she "feels the abnormal beats" but they are not too bothersome. No prior TTEs.  BP at home 120-130s/70-80s.   Family history: Father with CAD. 9 brothers and sisters and most have hypertension.  Past Medical History:  Diagnosis Date  . Allergy    Full allergy blood testing panel (environmental + foods) NORMAL 06/2018 (Dr.  Verlin Fester).  . Anemia   . Arthritis    rt, shoulder  . Asymptomatic cholelithiasis 2019/2020  . Chronic renal insufficiency, stage 3 (moderate) (HCC)    Stage II/III (GFR 50s-60s).  No proteinuria.  ARB added 12/2010 by nephrologist for renal protection.  Cr 1.1, GFR 55 on labs with onc 07/15/17.  . Colon cancer ([place]) 08/2014   (METASTATIC TO LIVER 2018)--Initial dx 09/2014-Low anterior resection of rectum-laparoscopic, + chemo X about 8 cycles with dose reductions for toxicity.  2018--CEA continued to rise so PET scan done; liver lesion showed up. Partial hepatectomy 12/2016.  f/u imaging 07/16/17--no signn of recurrence.  Prominence of left adenexa noted so onc ordered pelvic u/s.  Marland Kitchen Gout    Uric acid level decreased from 9 to 4.1 with addition of uloric 40 mg qd (02/2011).  Marland Kitchen History of anemia   . History of blood transfusion 1963  . History of nephrectomy, unilateral 1964   Wilms tumor at age 39 (right kidney)  . History of subacute thyroiditis   . HTN (hypertension)    since age 43.  Also hx of PIH.  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  . 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  Stable on CT w/contrast 07/16/17, 05/2018, and 09/08/2018.  Marland Kitchen Postmenopausal   . Right shoulder pain 2017  Dr. Mardelle Matte: pt did well with injection and PT.  Adhesive capsulitis vs cuff pathology: another injection by Dr. Mardelle Matte 05/20/16 helped x 2 wks.  MRI is next step: pt is considering this.  . Seborrheic dermatitis    Face; consider contact derm (around eyes); Lyndle Herrlich 09/2012--desonide 0.05% cream rx'd  . Solitary pulmonary nodule 2016/17   Stable on f/u CT imaging as of 10/2015.  Stable/unchanged 4 mm nodule RML on CT chest w/contrast 07/16/17.    Past Surgical History:  Procedure  Laterality Date  . APPENDECTOMY  1964  . BREAST REDUCTION SURGERY  2003   bilateral  . [place]  . COLON SURGERY  09/2014   [facility]  . COLONOSCOPY N/A 08/17/2014   Procedure: COLONOSCOPY;  Surgeon: Ladene Artist, MD;  Location: [facility];  Service: Endoscopy;  Laterality: N/A;  . COLONOSCOPY  01/2018   Polypectomy--tubular adenoma with NO high grade dysplasia.  Repeat 1-2 yrs for surveillance.  . COLONOSCOPY W/ POLYPECTOMY  11/12/2016   Multiple polyps,some adenomatous but no high grade dysplasia: recall 1 yr for surveillance.  Marland Kitchen LAPAROSCOPIC PARTIAL HEPATECTOMY  12/11/2016  . liver mass excision    . LIVER SURGERY    . low anter resect rectum  09/12/14   for colon cancer; [facility], [name]  . LUMBAR LAMINECTOMY  2004   microdiscectomy  . LUMBAR LAMINECTOMY/DECOMPRESSION MICRODISCECTOMY Left 05/21/2013   Procedure: LUMBAR LAMINECTOMY MICRODISCECTOMY L5-S1 LEFT ;  Surgeon: Tobi Bastos, MD;  Location: [facility];  Service: Orthopedics;  Laterality: Left;  . NEPHRECTOMY  1964   Wilms tumor (left)  . POLYPECTOMY      Current Medications: Current Meds  Medication Sig  . acetaminophen (TYLENOL) 500 MG tablet Take 1,000 mg by mouth every 6 (six) hours as needed for pain.  . calcium carbonate (TUMS - DOSED IN MG ELEMENTAL CALCIUM) 500 MG chewable tablet Chew 1 tablet by mouth daily as needed for indigestion or heartburn.   . cetirizine (ZYRTEC) 10 MG chewable tablet Chew 10 mg by mouth as needed for allergies.   Marland Kitchen colchicine 0.6 MG tablet Take 1 tablet (0.6 mg total) by mouth 2 (two) times daily as needed. for gout flare  . EPINEPHrine (AUVI-Q) 0.3 mg/0.3 mL IJ SOAJ injection Use as directed for severe allergic reaction  . febuxostat (ULORIC) 40 MG tablet Take 1 tablet (40 mg total) by mouth every morning.  . irbesartan (AVAPRO) 150 MG tablet Take 1 tablet (150 mg total) by mouth daily. Patient needs office visit for further refills.  . valACYclovir (VALTREX) 1000 MG  tablet TAKE 2 TABLETS BY MOUTH EVERY 12 HOURS AS 2 DOSES     Allergies:   Aldomet [methyldopa], Latex, Banana, Benzthiazide, Kiwi extract, Other, Peach [prunus persica], Plum pulp, Thiazide-type diuretics, Crab [shellfish allergy], and Tape   Social History   Socioeconomic History  . Marital status: Married    Spouse name: Not on file  . Number of children: Not on file  . Years of education: Not on file  . Highest education level: Not on file  Occupational History  . Not on file  Tobacco Use  . Smoking status: Former Smoker    Types: Cigarettes  . Smokeless tobacco: Never Used  . Tobacco comment: light in college  Vaping Use  . Vaping Use: Never used  Substance and Sexual Activity  . Alcohol use: Yes    Alcohol/week: 1.0 standard drink    Types: 1 Cans of beer per week  Comment: holidays and special occasions; 3-4 times yearly  . Drug use: No    Frequency: 1.0 times per week  . Sexual activity: Yes  Other Topics Concern  . Not on file  Social History Narrative   Married, one child.   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   Relocated to [place] from [place], [place] 2013.    No Tobacco.  Rare alcohol.  No drug use.    Social Determinants of Health   Financial Resource Strain:   . Difficulty of Paying Living Expenses: Not on file  Food Insecurity:   . Worried About Charity fundraiser in the Last Year: Not on file  . Ran Out of Food in the Last Year: Not on file  Transportation Needs:   . Lack of Transportation (Medical): Not on file  . Lack of Transportation (Non-Medical): Not on file  Physical Activity:   . Days of Exercise per Week: Not on file  . Minutes of Exercise per Session: Not on file  Stress:   . Feeling of Stress : Not on file  Social Connections:   . Frequency of Communication with Friends and Family: Not on file  . Frequency of Social Gatherings with Friends and Family: Not on file  . Attends Religious Services: Not on file  . Active Member of  Clubs or Organizations: Not on file  . Attends Archivist Meetings: Not on file  . Marital Status: Not on file     Family History: The patient's family history includes Alzheimer's disease in her father; COPD in her mother; Colon cancer in her father; Colon polyps in her brother and sister; Crohn's disease in her father; Heart disease in her father and mother; Hypertension in her father and mother. There is no history of Esophageal cancer, Rectal cancer, or Stomach cancer.  ROS:   Please see the history of present illness.    The patient denies dyspnea at rest or with exertion, palpitations, PND, orthopnea, or leg swelling. Denies cough, fever, chills. Denies nausea, vomiting. Denies syncope or presyncope. Denies dizziness or lightheadedness. Denies snoring.  EKGs/Labs/Other Studies Reviewed:    The following studies were reviewed today: ECG May 25, 2020: NSR with ventricular tigeminy   Recent Labs: 05/01/2020: ALT 35; BUN 27; Creatinine, Ser 1.12; Hemoglobin 13.0; Platelets 179.0; Potassium 4.4; Sodium 140; TSH 2.37  Recent Lipid Panel    Component Value Date/Time   CHOL 158 05/01/2020 0832   TRIG 70.0 05/01/2020 0832   HDL 53.30 05/01/2020 0832   CHOLHDL 3 05/01/2020 0832   VLDL 14.0 05/01/2020 0832   LDLCALC 91 05/01/2020 0832       Physical Exam:    VS:  BP 140/84   Pulse 79   Ht 5\' 5"  (1.651 m)   Wt 181 lb 1.6 oz (82.1 kg)   SpO2 93%   BMI 30.14 kg/m     Wt Readings from Last 3 Encounters:  05/26/20 181 lb 1.6 oz (82.1 kg)  05/25/2020 184 lb (83.5 kg)  02/23/20 186 lb (84.4 kg)     GEN:  Well nourished, well developed in no acute distress HEENT: Normal NECK: No JVD; No carotid bruits LYMPHATICS: No lymphadenopathy CARDIAC: RRR, 2/6 SEM, no rubs, gallops RESPIRATORY:  Clear to auscultation without rales, wheezing or rhonchi  ABDOMEN: Soft, non-tender, non-distended MUSCULOSKELETAL:  No edema; No deformity  SKIN: Warm and dry NEUROLOGIC:  Alert and  oriented x 3 PSYCHIATRIC:  Normal affect   ASSESSMENT:    1. Chest  pain, unspecified type   2. PVC's (premature ventricular contractions)   3. Cardiac murmur    PLAN:    In order of problems listed above:  #Chest Pain: Patient with episode of band-like chest pain after exercising that was relieved with deep breathing and prolonged rest. No symptoms during exercise, but occurred right afterwards. Not classic angina, however, association with recent exertion is concerning. Risk factors include HTN, prior chemo-XRT, and family history. -Check pharmacologic myoview (patient cannot do inclines due to back pain) -TTE  #Ventricular Trigeminy: Patient with frequent ventricular ectopy. Feels the PVCs however they are not overly bothersome.  -Zio monitor x 7days to assess burden of PVCs -TTE -May consider BB however patient would like to defer medication for now   #Cardiac Murmur: Patient with 2/6 systolic murmur on exam. Stable without signs of HF. Has notable history of chemo and chest XRT as a child for Wilm's tumor. No TTEs in our system. -Check TTE  #History of metastatic colon cancer s/p chemo/XRT #History of pedatric wilm's tumor s/p chem/XRT: Has received radiation therapy to the chest. Exam with 2/6 systolic murmur. Will obtain TTE as above. -Check TTE as above -Follow-up with GI team as scheduled  Medication Adjustments/Labs and Tests Ordered: Current medicines are reviewed at length with the patient today.  Concerns regarding medicines are outlined above.  Orders Placed This Encounter  Procedures  . Myocardial Perfusion Imaging  . LONG TERM MONITOR (3-14 DAYS)  . ECHOCARDIOGRAM COMPLETE   No orders of the defined types were placed in this encounter.   Patient Instructions  Medication Instructions:   Your physician recommends that you continue on your current medications as directed. Please refer to the Current Medication list given to you today.  *If you need a  refill on your cardiac medications before your next appointment, please call your pharmacy*  Testing/Procedures:  Your physician has requested that you have an echocardiogram. Echocardiography is a painless test that uses sound waves to create images of your heart. It provides your doctor with information about the size and shape of your heart and how well your heart's chambers and valves are working. This procedure takes approximately one hour. There are no restrictions for this procedure.  Your physician has requested that you have a lexiscan myoview. For further information please visit [email or web address]. Please follow instruction sheet, as given.  ZIO XT- Long Term Monitor Instructions   Your physician has requested you wear your ZIO patch monitor___7____days.   This is a single patch monitor.  [employer] supplies one patch monitor per enrollment.  Additional stickers are not available.   Please do not apply patch if you will be having a Nuclear Stress Test, Echocardiogram, Cardiac CT, MRI, or Chest Xray during the time frame you would be wearing the monitor. The patch cannot be worn during these tests.  You cannot remove and re-apply the ZIO XT patch monitor.   Your ZIO patch monitor will be sent USPS Priority mail from [facility] directly to your home address. The monitor may also be mailed to a PO BOX if home delivery is not available.   It may take 3-5 days to receive your monitor after you have been enrolled.   Once you have received you monitor, please review enclosed instructions.  Your monitor has already been registered assigning a specific monitor serial # to you.   Applying the monitor   Shave hair from upper left chest.   Hold abrader disc by orange tab.  Rub abrader  in 40 strokes over left upper chest as indicated in your monitor instructions.   Clean area with 4 enclosed alcohol pads .  Use all pads to assure are is cleaned thoroughly.  Let dry.   Apply patch as  indicated in monitor instructions.  Patch will be place under collarbone on left side of chest with arrow pointing upward.   Rub patch adhesive wings for 2 minutes.Remove white label marked "1".  Remove white label marked "2".  Rub patch adhesive wings for 2 additional minutes.   While looking in a mirror, press and release button in center of patch.  A small green light will flash 3-4 times .  This will be your only indicator the monitor has been turned on.     Do not shower for the first 24 hours.  You may shower after the first 24 hours.   Press button if you feel a symptom. You will hear a small click.  Record Date, Time and Symptom in the Patient Log Book.   When you are ready to remove patch, follow instructions on last 2 pages of Patient Log Book.  Stick patch monitor onto last page of Patient Log Book.   Place Patient Log Book in [place] box.  Use locking tab on box and tape box closed securely.  The [employer] and [employer] has [employer] on it.  Please place in mailbox as soon as possible.  Your physician should have your test results approximately 7 days after the monitor has been mailed back to [facility].   Call [place] at [phone] if you have questions regarding your ZIO XT patch monitor.  Call them immediately if you see an orange light blinking on your monitor.   If your monitor falls off in less than 4 days contact our Monitor department at [phone].  If your monitor becomes loose or falls off after 4 days call [employer] at [phone] for suggestions on securing your monitor.   Follow-Up: At [facility], you and your health needs are our priority.  As part of our continuing mission to provide you with exceptional heart care, we have created designated Provider Care Teams.  These Care Teams include your primary Cardiologist (physician) and Advanced Practice Providers (APPs -  Physician Assistants and Nurse Practitioners) who all work together to  provide you with the care you need, when you need it.  We recommend signing up for the patient portal called "MyChart".  Sign up information is provided on this After Visit Summary.  MyChart is used to connect with patients for Virtual Visits (Telemedicine).  Patients are able to view lab/test results, encounter notes, upcoming appointments, etc.  Non-urgent messages can be sent to your provider as well.   To learn more about what you can do with MyChart, go to [email or web address].    Your next appointment:   6 month(s)  The format for your next appointment:   In Person  Provider:   Gwyndolyn Kaufman, MD        Signed, Freada Bergeron, MD  05/26/2020 11:06 AM    [place]

## 2020-05-26 ENCOUNTER — Other Ambulatory Visit: Payer: Self-pay

## 2020-05-26 ENCOUNTER — Encounter: Payer: Self-pay | Admitting: Cardiology

## 2020-05-26 ENCOUNTER — Telehealth (HOSPITAL_COMMUNITY): Payer: Self-pay | Admitting: *Deleted

## 2020-05-26 ENCOUNTER — Encounter: Payer: Self-pay | Admitting: *Deleted

## 2020-05-26 ENCOUNTER — Telehealth: Payer: Self-pay | Admitting: Radiology

## 2020-05-26 ENCOUNTER — Ambulatory Visit: Payer: BC Managed Care – PPO | Admitting: Cardiology

## 2020-05-26 VITALS — BP 140/84 | HR 79 | Ht 65.0 in | Wt 181.1 lb

## 2020-05-26 DIAGNOSIS — I493 Ventricular premature depolarization: Secondary | ICD-10-CM | POA: Diagnosis not present

## 2020-05-26 DIAGNOSIS — R079 Chest pain, unspecified: Secondary | ICD-10-CM

## 2020-05-26 DIAGNOSIS — R011 Cardiac murmur, unspecified: Secondary | ICD-10-CM

## 2020-05-26 NOTE — Telephone Encounter (Signed)
Enrolled patient for a 7 day Zio XT monitor to be mailed to patients home.  

## 2020-05-26 NOTE — Patient Instructions (Signed)
Medication Instructions:   Your physician recommends that you continue on your current medications as directed. Please refer to the Current Medication list given to you today.  *If you need a refill on your cardiac medications before your next appointment, please call your pharmacy*  Testing/Procedures:  Your physician has requested that you have an echocardiogram. Echocardiography is a painless test that uses sound waves to create images of your heart. It provides your doctor with information about the size and shape of your heart and how well your heart's chambers and valves are working. This procedure takes approximately one hour. There are no restrictions for this procedure.  Your physician has requested that you have a lexiscan myoview. For further information please visit [email or web address]. Please follow instruction sheet, as given.  ZIO XT- Long Term Monitor Instructions   Your physician has requested you wear your ZIO patch monitor___7____days.   This is a single patch monitor.  Ir[employer] supplies one patch monitor per enrollment.  Additional stickers are not available.   Please do not apply patch if you will be having a Nuclear Stress Test, Echocardiogram, Cardiac CT, MRI, or Chest Xray during the time frame you would be wearing the monitor. The patch cannot be worn during these tests.  You cannot remove and re-apply the ZIO XT patch monitor.   Your ZIO patch monitor will be sent USPS Priority mail from [facility] directly to your home address. The monitor may also be mailed to a PO BOX if home delivery is not available.   It may take 3-5 days to receive your monitor after you have been enrolled.   Once you have received you monitor, please review enclosed instructions.  Your monitor has already been registered assigning a specific monitor serial # to you.   Applying the monitor   Shave hair from upper left chest.   Hold abrader disc by orange tab.  Rub abrader in 40  strokes over left upper chest as indicated in your monitor instructions.   Clean area with 4 enclosed alcohol pads .  Use all pads to assure are is cleaned thoroughly.  Let dry.   Apply patch as indicated in monitor instructions.  Patch will be place under collarbone on left side of chest with arrow pointing upward.   Rub patch adhesive wings for 2 minutes.Remove white label marked "1".  Remove white label marked "2".  Rub patch adhesive wings for 2 additional minutes.   While looking in a mirror, press and release button in center of patch.  A small green light will flash 3-4 times .  This will be your only indicator the monitor has been turned on.     Do not shower for the first 24 hours.  You may shower after the first 24 hours.   Press button if you feel a symptom. You will hear a small click.  Record Date, Time and Symptom in the Patient Log Book.   When you are ready to remove patch, follow instructions on last 2 pages of Patient Log Book.  Stick patch monitor onto last page of Patient Log Book.   Place Patient Log Book in [place] box.  Use locking tab on box and tape box closed securely.  The [employer] has [employer] on it.  Please place in mailbox as soon as possible.  Your physician should have your test results approximately 7 days after the monitor has been mailed back to [facility].   Call [facility]  at [phone] if you have questions regarding your ZIO XT patch monitor.  Call them immediately if you see an orange light blinking on your monitor.   If your monitor falls off in less than 4 days contact our Monitor department at [phone].  If your monitor becomes loose or falls off after 4 days call [employer] at [phone] for suggestions on securing your monitor.   Follow-Up: At [facility], you and your health needs are our priority.  As part of our continuing mission to provide you with exceptional heart care, we have created  designated Provider Care Teams.  These Care Teams include your primary Cardiologist (physician) and Advanced Practice Providers (APPs -  Physician Assistants and Nurse Practitioners) who all work together to provide you with the care you need, when you need it.  We recommend signing up for the patient portal called "MyChart".  Sign up information is provided on this After Visit Summary.  MyChart is used to connect with patients for Virtual Visits (Telemedicine).  Patients are able to view lab/test results, encounter notes, upcoming appointments, etc.  Non-urgent messages can be sent to your provider as well.   To learn more about what you can do with MyChart, go to [email or web address].    Your next appointment:   6 month(s)  The format for your next appointment:   In Person  Provider:   Gwyndolyn Kaufman, MD

## 2020-05-26 NOTE — Telephone Encounter (Signed)
Patient given detailed instructions per Myocardial Perfusion Study Information Sheet for the test on 05/31/20 at 10:00. Patient notified to arrive 15 minutes early and that it is imperative to arrive on time for appointment to keep from having the test rescheduled.  If you need to cancel or reschedule your appointment, please call the office within 24 hours of your appointment. . Patient verbalized understanding.Veronia Beets

## 2020-05-31 ENCOUNTER — Ambulatory Visit (HOSPITAL_COMMUNITY): Payer: BC Managed Care – PPO | Attending: Cardiovascular Disease

## 2020-05-31 ENCOUNTER — Other Ambulatory Visit: Payer: Self-pay

## 2020-05-31 DIAGNOSIS — R079 Chest pain, unspecified: Secondary | ICD-10-CM | POA: Insufficient documentation

## 2020-05-31 HISTORY — PX: CARDIOVASCULAR STRESS TEST: SHX262

## 2020-05-31 LAB — MYOCARDIAL PERFUSION IMAGING
LV dias vol: 41 mL (ref 46–106)
LV sys vol: 11 mL
Peak HR: 121 {beats}/min
Rest HR: 76 {beats}/min
SDS: 1
SRS: 0
SSS: 1
TID: 0.86

## 2020-05-31 MED ORDER — TECHNETIUM TC 99M TETROFOSMIN IV KIT
32.7000 | PACK | Freq: Once | INTRAVENOUS | Status: AC | PRN
  Administered 2020-05-31: 32.7 via INTRAVENOUS
  Filled 2020-05-31: qty 33

## 2020-05-31 MED ORDER — REGADENOSON 0.4 MG/5ML IV SOLN
0.4000 mg | Freq: Once | INTRAVENOUS | Status: AC
  Administered 2020-05-31: 0.4 mg via INTRAVENOUS

## 2020-05-31 MED ORDER — TECHNETIUM TC 99M TETROFOSMIN IV KIT
10.3000 | PACK | Freq: Once | INTRAVENOUS | Status: AC | PRN
  Administered 2020-05-31: 10.3 via INTRAVENOUS
  Filled 2020-05-31: qty 11

## 2020-06-01 ENCOUNTER — Encounter: Payer: Self-pay | Admitting: Family Medicine

## 2020-06-05 DIAGNOSIS — C189 Malignant neoplasm of colon, unspecified: Secondary | ICD-10-CM | POA: Diagnosis not present

## 2020-06-05 DIAGNOSIS — K8689 Other specified diseases of pancreas: Secondary | ICD-10-CM | POA: Diagnosis not present

## 2020-06-05 DIAGNOSIS — C787 Secondary malignant neoplasm of liver and intrahepatic bile duct: Secondary | ICD-10-CM | POA: Diagnosis not present

## 2020-06-12 DIAGNOSIS — R011 Cardiac murmur, unspecified: Secondary | ICD-10-CM

## 2020-06-12 HISTORY — DX: Cardiac murmur, unspecified: R01.1

## 2020-06-12 HISTORY — PX: TRANSTHORACIC ECHOCARDIOGRAM: SHX275

## 2020-06-15 ENCOUNTER — Ambulatory Visit (HOSPITAL_COMMUNITY): Payer: BC Managed Care – PPO | Attending: Cardiology

## 2020-06-15 ENCOUNTER — Other Ambulatory Visit: Payer: Self-pay

## 2020-06-15 DIAGNOSIS — R079 Chest pain, unspecified: Secondary | ICD-10-CM | POA: Insufficient documentation

## 2020-06-15 LAB — ECHOCARDIOGRAM COMPLETE
Area-P 1/2: 4.29 cm2
S' Lateral: 2.6 cm

## 2020-06-30 DIAGNOSIS — Z20822 Contact with and (suspected) exposure to covid-19: Secondary | ICD-10-CM | POA: Diagnosis not present

## 2020-06-30 DIAGNOSIS — Z03818 Encounter for observation for suspected exposure to other biological agents ruled out: Secondary | ICD-10-CM | POA: Diagnosis not present

## 2020-07-03 ENCOUNTER — Ambulatory Visit: Payer: BC Managed Care – PPO

## 2020-07-11 ENCOUNTER — Ambulatory Visit: Payer: BC Managed Care – PPO | Attending: Internal Medicine

## 2020-07-11 DIAGNOSIS — Z23 Encounter for immunization: Secondary | ICD-10-CM

## 2020-07-11 NOTE — Progress Notes (Signed)
   Covid-19 Vaccination Clinic  Name:  Elaine Jenkins    MRN: [id number] DOB: [birth year]  07/11/2020  [name] was observed post Covid-19 immunization for 15 minutes without incident. She was provided with Vaccine Information Sheet and instruction to access the V-Safe system.   [name] was instructed to call 911 with any severe reactions post vaccine: Marland Kitchen Difficulty breathing  . Swelling of face and throat  . A fast heartbeat  . A bad rash all over body  . Dizziness and weakness   Immunizations Administered    No immunizations on file.

## 2020-08-15 ENCOUNTER — Other Ambulatory Visit: Payer: Self-pay | Admitting: Family Medicine

## 2020-08-18 ENCOUNTER — Other Ambulatory Visit: Payer: Self-pay

## 2020-08-18 NOTE — Telephone Encounter (Signed)
Pt medication was declined on 08/16/19 due to needed appt.  Spoke with pt to sched an appt. Pt declined. Would like to know if provider would refill meds. Please advise. Pt aware provider is not in office

## 2020-08-18 NOTE — Telephone Encounter (Signed)
Patent has called her pharmacy regarding 2 of her meds.  I dont see anything as to why they cannot be filled for her. Please advise.  irbesartan (AVAPRO) 150 MG tablet [[id number]]    [place]

## 2020-08-20 ENCOUNTER — Encounter: Payer: Self-pay | Admitting: Family Medicine

## 2020-08-20 MED ORDER — VALACYCLOVIR HCL 1 G PO TABS: ORAL_TABLET | ORAL | 6 refills | Status: DC

## 2020-08-20 MED ORDER — IRBESARTAN 150 MG PO TABS: 150.0000 mg | ORAL_TABLET | Freq: Every day | ORAL | 3 refills | Status: DC

## 2020-08-20 NOTE — Telephone Encounter (Signed)
Valtrex and irbesartan erx'd.

## 2020-08-21 ENCOUNTER — Other Ambulatory Visit: Payer: Self-pay

## 2020-08-21 MED ORDER — FEBUXOSTAT 40 MG PO TABS
40.0000 mg | ORAL_TABLET | Freq: Every morning | ORAL | 3 refills | Status: DC
Start: 2020-08-21 — End: 2021-08-31

## 2020-08-21 NOTE — Telephone Encounter (Signed)
Spoke with patient regarding available medication refills and she stated she actually needed a refill on [place]. Would like to know if provider would refill med. Pt aware provider is not in office.   Please advise. Medication pending.

## 2020-08-21 NOTE — Telephone Encounter (Signed)
Patient was notified refill sent for uloric.

## 2020-09-19 DIAGNOSIS — C787 Secondary malignant neoplasm of liver and intrahepatic bile duct: Secondary | ICD-10-CM | POA: Diagnosis not present

## 2020-09-19 DIAGNOSIS — K8020 Calculus of gallbladder without cholecystitis without obstruction: Secondary | ICD-10-CM | POA: Diagnosis not present

## 2020-09-19 DIAGNOSIS — C189 Malignant neoplasm of colon, unspecified: Secondary | ICD-10-CM | POA: Diagnosis not present

## 2020-09-19 DIAGNOSIS — C19 Malignant neoplasm of rectosigmoid junction: Secondary | ICD-10-CM | POA: Diagnosis not present

## 2020-10-03 DIAGNOSIS — H0288B Meibomian gland dysfunction left eye, upper and lower eyelids: Secondary | ICD-10-CM | POA: Diagnosis not present

## 2020-10-03 DIAGNOSIS — H0015 Chalazion left lower eyelid: Secondary | ICD-10-CM | POA: Diagnosis not present

## 2020-10-12 DIAGNOSIS — H10413 Chronic giant papillary conjunctivitis, bilateral: Secondary | ICD-10-CM | POA: Diagnosis not present

## 2020-10-12 DIAGNOSIS — H04123 Dry eye syndrome of bilateral lacrimal glands: Secondary | ICD-10-CM | POA: Diagnosis not present

## 2020-10-12 DIAGNOSIS — H0015 Chalazion left lower eyelid: Secondary | ICD-10-CM | POA: Diagnosis not present

## 2020-10-12 DIAGNOSIS — H353131 Nonexudative age-related macular degeneration, bilateral, early dry stage: Secondary | ICD-10-CM | POA: Diagnosis not present

## 2020-10-16 DIAGNOSIS — D2262 Melanocytic nevi of left upper limb, including shoulder: Secondary | ICD-10-CM | POA: Diagnosis not present

## 2020-10-16 DIAGNOSIS — D2271 Melanocytic nevi of right lower limb, including hip: Secondary | ICD-10-CM | POA: Diagnosis not present

## 2020-10-16 DIAGNOSIS — L720 Epidermal cyst: Secondary | ICD-10-CM | POA: Diagnosis not present

## 2020-10-16 DIAGNOSIS — D2261 Melanocytic nevi of right upper limb, including shoulder: Secondary | ICD-10-CM | POA: Diagnosis not present

## 2020-11-23 NOTE — Telephone Encounter (Signed)
Monitor was never returned and marked "lost" in [place] system order will be cancelled

## 2020-12-31 NOTE — Progress Notes (Addendum)
Cardiology Office Note:    Date:  01/02/2021   ID:  Elaine Jenkins, DOB 1960-11-10, MRN [id number]  PCP:  Tammi Sou, MD  [facility] Cardiologist:  None  [facility] Electrophysiologist:  None   Referring MD: Tammi Sou, MD    History of Present Illness:    Elaine Jenkins is a 60 y.o. 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  Patient was initially seen in clinic on 05/26/20 for episode of chest tightness and ventricular trigeminy. Myoview 05/31/20 showed EF 73%, no ischemia or infarction. TTE 06/15/20 with LVEF 60-65%, normal strain, normal diastology and no significant valve disease. Cardiac monitor was recommended but no performed.  Today, the patient feels well. She just got back from vacation and she feels very well. No chest pain, SOB, lightheadedness or dizziness. Only occasional palpitations. Continues to walk on a treadmill everyday (walks 1 mile at a time and then takes a break to do something and then goes back on). Brought cardiac monitor to clinic today as she has not started it yet. Amenable to do the monitor if needed.  Family history: Father with CAD. 9 brothers and sisters and most have hypertension.  Past Medical History:  Diagnosis Date  . Allergy    Full allergy blood testing panel (environmental + foods) NORMAL 06/2018 (Dr. Verlin Fester).  . Anemia   . Arthritis    rt, shoulder  . Asymptomatic cholelithiasis 2019/2020  . Atypical chest pain    cardiac w/u normal  . Chronic renal insufficiency, stage 3 (moderate) (HCC)    Stage II/III (GFR 50s-60s).  No proteinuria.  ARB added 12/2010 by nephrologist for renal protection.  Cr 1.1, GFR 55 on labs with onc 07/15/17.  . Colon cancer ([place]) 08/2014   (METASTATIC TO LIVER 2018)--Initial dx 09/2014-Low anterior resection of  rectum-laparoscopic, + chemo X about 8 cycles with dose reductions for toxicity.  2018--CEA continued to rise so PET scan done; liver lesion showed up. Partial hepatectomy 12/2016.  f/u imaging 07/16/17--no signn of recurrence.  Prominence of left adenexa noted so onc ordered pelvic u/s.  Marland Kitchen Gout    Uric acid level decreased from 9 to 4.1 with addition of uloric 40 mg qd (02/2011).  Marland Kitchen History of anemia   . History of blood transfusion 1963  . History of nephrectomy, unilateral 1964   Wilms tumor at age 22 (right kidney)  . History of subacute thyroiditis   . HTN (hypertension)    since age 69.  Also hx of PIH.  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  . 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  Stable on CT w/contrast 07/16/17, 05/2018, and 09/08/2018.  Marland Kitchen Postmenopausal   . Premature ventricular contractions (PVCs) (VPCs) 2021   stress test normal, echo normal.  Rhythm monitoring ordered but not scheduled as of 08/2020.  . Right shoulder pain 2017   Dr. Mardelle Matte: pt did well with injection and PT.  Adhesive capsulitis vs cuff pathology: another injection by Dr. Mardelle Matte 05/20/16 helped x 2 wks.  MRI is next step: pt is considering this.  . Seborrheic dermatitis    Face; consider  contact derm (around eyes); Lyndle Herrlich 09/2012--desonide 0.05% cream rx'd  . Solitary pulmonary nodule 2016/17   Stable on f/u CT imaging as of 10/2015.  Stable/unchanged 4 mm nodule RML on CT chest w/contrast 07/16/17.  Marland Kitchen Systolic murmur [id number]/7893   echo normal    Past Surgical History:  Procedure Laterality Date  . APPENDECTOMY  1964  . BREAST REDUCTION SURGERY  2003   bilateral  . CARDIOVASCULAR STRESS TEST  05/31/2020   myoc perf img NORMAL/LOW RISK  . CESAREAN SECTION  1998  . COLON SURGERY  09/2014    [facility]  . COLONOSCOPY N/A 08/17/2014   Procedure: COLONOSCOPY;  Surgeon: Ladene Artist, MD;  Location: [facility];  Service: Endoscopy;  Laterality: N/A;  . COLONOSCOPY  01/2018   Polypectomy--tubular adenoma with NO high grade dysplasia.  Repeat 1-2 yrs for surveillance.  . COLONOSCOPY W/ POLYPECTOMY  11/12/2016   Multiple polyps,some adenomatous but no high grade dysplasia: recall 1 yr for surveillance.  Marland Kitchen LAPAROSCOPIC PARTIAL HEPATECTOMY  12/11/2016  . liver mass excision    . LIVER SURGERY    . low anter resect rectum  09/12/14   for colon cancer; [facility], [name]  . LUMBAR LAMINECTOMY  2004   microdiscectomy  . LUMBAR LAMINECTOMY/DECOMPRESSION MICRODISCECTOMY Left 05/21/2013   Procedure: LUMBAR LAMINECTOMY MICRODISCECTOMY L5-S1 LEFT ;  Surgeon: Tobi Bastos, MD;  Location: [facility];  Service: Orthopedics;  Laterality: Left;  . NEPHRECTOMY  1964   Wilms tumor (left)  . POLYPECTOMY    . TRANSTHORACIC ECHOCARDIOGRAM  06/2020   Normal except grd II DD    Current Medications: Current Meds  Medication Sig  . acetaminophen (TYLENOL) 500 MG tablet Take 1,000 mg by mouth every 6 (six) hours as needed for pain.  . calcium carbonate (TUMS - DOSED IN MG ELEMENTAL CALCIUM) 500 MG chewable tablet Chew 1 tablet by mouth daily as needed for indigestion or heartburn.   . cetirizine (ZYRTEC) 10 MG chewable tablet Chew 10 mg by mouth as needed for allergies.   Marland Kitchen colchicine 0.6 MG tablet Take 1 tablet (0.6 mg total) by mouth 2 (two) times daily as needed. for gout flare  . EPINEPHrine (AUVI-Q) 0.3 mg/0.3 mL IJ SOAJ injection Use as directed for severe allergic reaction  . febuxostat (ULORIC) 40 MG tablet Take 1 tablet (40 mg total) by mouth every morning.  . irbesartan (AVAPRO) 150 MG tablet Take 1 tablet (150 mg total) by mouth daily.  . valACYclovir (VALTREX) 1000 MG tablet 2 tabs po q12h x 2 doses     Allergies:   Aldomet [methyldopa], Latex, Banana, Benzthiazide, Kiwi extract, Other,  Peach [prunus persica], Plum pulp, Thiazide-type diuretics, Crab [shellfish allergy], and Tape   Social History   Socioeconomic History  . Marital status: Married    Spouse name: Not on file  . Number of children: Not on file  . Years of education: Not on file  . Highest education level: Not on file  Occupational History  . Not on file  Tobacco Use  . Smoking status: Former Smoker    Types: Cigarettes  . Smokeless tobacco: Never Used  . Tobacco comment: light in college  Vaping Use  . Vaping Use: Never used  Substance and Sexual Activity  . Alcohol use: Yes    Alcohol/week: 1.0 standard drink    Types: 1 Cans of beer per week    Comment: holidays and special occasions; 3-4 times yearly  . Drug use: No  Frequency: 1.0 times per week  . Sexual activity: Yes  Other Topics Concern  . Not on file  Social History Narrative   Married, one child.   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   Relocated to [place] from [place], [place] 2013.    No Tobacco.  Rare alcohol.  No drug use.    Social Determinants of Health   Financial Resource Strain: Not on file  Food Insecurity: Not on file  Transportation Needs: Not on file  Physical Activity: Not on file  Stress: Not on file  Social Connections: Not on file     Family History: The patient's family history includes Alzheimer's disease in her father; COPD in her mother; Colon cancer in her father; Colon polyps in her brother and sister; Crohn's disease in her father; Heart disease in her father and mother; Hypertension in her father and mother. There is no history of Esophageal cancer, Rectal cancer, or Stomach cancer.  ROS:   Please see the history of present illness.    Review of Systems  Constitutional: Negative for fever.  HENT: Negative for hearing loss.   Eyes: Negative for blurred vision.  Respiratory: Negative for shortness of breath.   Cardiovascular: Positive for palpitations. Negative for chest pain, orthopnea,  claudication, leg swelling and PND.  Gastrointestinal: Negative for nausea and vomiting.  Genitourinary: Negative for hematuria.  Musculoskeletal: Positive for back pain.  Neurological: Negative for dizziness and loss of consciousness.  Endo/Heme/Allergies: Negative for polydipsia.  Psychiatric/Behavioral: Negative for substance abuse.    EKGs/Labs/Other Studies Reviewed:    The following studies were reviewed today: Myoview 05/31/20:  Nuclear stress EF: 73%.  There was no ST segment deviation noted during stress.  No T wave inversion was noted during stress.  The study is normal.  This is a low risk study.  The left ventricular ejection fraction is hyperdynamic (>65%).   1. Normal study without ischemia or infarction.  2. Normal LVEF, 73%. 3. This is a low-risk study.   TTE 06/15/20: 1. Left ventricular ejection fraction, by estimation, is 55 to 60%. The  left ventricle has normal function. The left ventricle has no regional  wall motion abnormalities. Left ventricular diastolic parameters are  consistent with Grade II diastolic  dysfunction (pseudonormalization). GLS -24.2%, normal.  2. Right ventricular systolic function is normal. The right ventricular  size is normal. There is normal pulmonary artery systolic pressure. The  estimated right ventricular systolic pressure is 30.8 mmHg.  3. The mitral valve is normal in structure. Trivial mitral valve  regurgitation. No evidence of mitral stenosis.  4. The aortic valve is tricuspid. Aortic valve regurgitation is not  visualized. No aortic stenosis is present.  5. The inferior vena cava is normal in size with greater than 50%  respiratory variability, suggesting right atrial pressure of 3 mmHg.   ECG 01/02/21: NSR with 1 PVC, HR 66  Recent Labs: 05/01/2020: ALT 35; BUN 27; Creatinine, Ser 1.12; Hemoglobin 13.0; Platelets 179.0; Potassium 4.4; Sodium 140; TSH 2.37  Recent Lipid Panel    Component Value Date/Time    CHOL 158 05/01/2020 0832   TRIG 70.0 05/01/2020 0832   HDL 53.30 05/01/2020 0832   CHOLHDL 3 05/01/2020 0832   VLDL 14.0 05/01/2020 0832   LDLCALC 91 05/01/2020 0832       Physical Exam:    VS:  BP 132/84   Pulse 66   Ht 5\' 5"  (1.651 m)   Wt 190 lb (86.2 kg)   SpO2  95%   BMI 31.62 kg/m     Wt Readings from Last 3 Encounters:  01/02/21 190 lb (86.2 kg)  05/31/20 181 lb (82.1 kg)  05/26/20 181 lb 1.6 oz (82.1 kg)     GEN:  Well nourished, well developed in no acute distress HEENT: Normal NECK: No JVD; No carotid bruits CARDIAC: RRR, 1/6 SEM. No rubs. RESPIRATORY:  Clear to auscultation without rales, wheezing or rhonchi  ABDOMEN: Soft, non-tender, non-distended MUSCULOSKELETAL:  No edema; No deformity  SKIN: Warm and dry NEUROLOGIC:  Alert and oriented x 3 PSYCHIATRIC:  Normal affect   ASSESSMENT:    1. PVC (premature ventricular contraction)   2. Chest pain, unspecified type   3. Cardiac murmur   4. Nephroblastoma, unspecified laterality ([place])   5. Malignant neoplasm of colon, unspecified part of colon ([place])    PLAN:    In order of problems listed above:  #Non-Cardiac Chest Pain: Reassuring cardiac work-up with no ischemia on myoview. TTE with LVEF 60-65%, no significant valve disease. -Continue to monitor  #Ventricular Trigeminy: Patient with frequent ventricular ectopy. Has occasional palpitations but not overly bothersome.  TTE with normal BiV function and no significant valve disease. Myoview with no ischemia or infarction. Did not do monitor after last visit but amenable to do it today. 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 TTE with  normal strain as above -Serial monitoring of TTEs -Follow-up with GI team as scheduled  Medication Adjustments/Labs and Tests Ordered: Current medicines are reviewed at length with the patient today.  Concerns regarding medicines are outlined above.  Orders Placed This Encounter  Procedures  . LONG TERM MONITOR (3-14 DAYS)  . EKG 12-Lead   No orders of the defined types were placed in this encounter.   Patient Instructions  Medication Instructions:  Your physician recommends that you continue on your current medications as directed. Please refer to the Current Medication list given to you today.  *If you need a refill on your cardiac medications before your next appointment, please call your pharmacy*   Testing/Procedures:  [place] Monitor Instructions   Your physician has requested you wear a ZIO patch monitor for __3_ days.  This is a single patch monitor.   [employer] supplies one patch monitor per enrollment. Additional stickers are not available. Please do not apply patch if you will be having a Nuclear Stress Test, Echocardiogram, Cardiac CT, MRI, or Chest Xray during the period you would be wearing the monitor. The patch cannot be worn during these tests. You cannot remove and re-apply the ZIO XT patch monitor.  Your ZIO patch monitor will be sent Fed Ex from [employer] directly to your home address. It may take 3-5 days to receive your monitor after you have been enrolled.  Once you have received your monitor, please review the enclosed instructions. Your monitor has already been registered assigning a specific monitor serial # to you.  Billing and Patient Assistance Program Information   We have supplied [employer] with any of your insurance information on file for billing purposes. [employer] offers a sliding scale Patient Assistance Program for patients that do not have insurance, or whose insurance does not completely cover the cost  of the ZIO monitor.   You  must apply for the Patient Assistance Program to qualify for this discounted rate.     To apply, please call [employer] at [phone], select option 4, then select option 2, and ask to apply for Patient Assistance Program.  Theodore Demark will ask your household income, and how many people are in your household.  They will quote your out-of-pocket cost based on that information.  [employer] will also be able to set up a 52-month, interest-free payment plan if needed.  Applying the monitor   Shave hair from upper left chest.  Hold abrader disc by orange tab. Rub abrader in 40 strokes over the upper left chest as indicated in your monitor instructions.  Clean area with 4 enclosed alcohol pads. Let dry.  Apply patch as indicated in monitor instructions. Patch will be placed under collarbone on left side of chest with arrow pointing upward.  Rub patch adhesive wings for 2 minutes. Remove white label marked "1". Remove the white label marked "2". Rub patch adhesive wings for 2 additional minutes.  While looking in a mirror, press and release button in center of patch. A small green light will flash 3-4 times. This will be your only indicator that the monitor has been turned on. ?  Do not shower for the first 24 hours. You may shower after the first 24 hours.  Press the button if you feel a symptom. You will hear a small click. Record Date, Time and Symptom in the Patient Logbook.  When you are ready to remove the patch, follow instructions on the last 2 pages of the Patient Logbook. Stick patch monitor onto the last page of Patient Logbook.  Place Patient Logbook in the blue and white box.  Use locking tab on box and tape box closed securely.  The blue and white box has prepaid postage on it. Please place it in the mailbox as soon as possible. Your physician should have your test results approximately 7 days after the monitor has been mailed back to [facility].  Call [place] at [phone]  if you have questions regarding your ZIO XT patch monitor. Call them immediately if you see an orange light blinking on your monitor.  If your monitor falls off in less than 4 days, contact our Monitor department at [phone]. ?If your monitor becomes loose or falls off after 4 days call [employer] at [phone] for suggestions on securing your monitor.?   Follow-Up: At [facility], you and your health needs are our priority.  As part of our continuing mission to provide you with exceptional heart care, we have created designated Provider Care Teams.  These Care Teams include your primary Cardiologist (physician) and Advanced Practice Providers (APPs -  Physician Assistants and Nurse Practitioners) who all work together to provide you with the care you need, when you need it.  We recommend signing up for the patient portal called "MyChart".  Sign up information is provided on this After Visit Summary.  MyChart is used to connect with patients for Virtual Visits (Telemedicine).  Patients are able to view lab/test results, encounter notes, upcoming appointments, etc.  Non-urgent messages can be sent to your provider as well.   To learn more about what you can do with MyChart, go to [email or web address].    Your next appointment:   1 year(s)  The format for your next appointment:   In Person  Provider:   Gwyndolyn Kaufman, MD  Signed, Freada Bergeron, MD  01/02/2021 5:06 PM    [facility]

## 2021-01-02 ENCOUNTER — Ambulatory Visit: Payer: BC Managed Care – PPO | Admitting: Cardiology

## 2021-01-02 ENCOUNTER — Other Ambulatory Visit: Payer: Self-pay

## 2021-01-02 ENCOUNTER — Encounter: Payer: Self-pay | Admitting: Cardiology

## 2021-01-02 ENCOUNTER — Ambulatory Visit (INDEPENDENT_AMBULATORY_CARE_PROVIDER_SITE_OTHER): Payer: BC Managed Care – PPO

## 2021-01-02 VITALS — BP 132/84 | HR 66 | Ht 65.0 in | Wt 190.0 lb

## 2021-01-02 DIAGNOSIS — C649 Malignant neoplasm of unspecified kidney, except renal pelvis: Secondary | ICD-10-CM | POA: Diagnosis not present

## 2021-01-02 DIAGNOSIS — I493 Ventricular premature depolarization: Secondary | ICD-10-CM | POA: Diagnosis not present

## 2021-01-02 DIAGNOSIS — C189 Malignant neoplasm of colon, unspecified: Secondary | ICD-10-CM

## 2021-01-02 DIAGNOSIS — R011 Cardiac murmur, unspecified: Secondary | ICD-10-CM | POA: Diagnosis not present

## 2021-01-02 DIAGNOSIS — R079 Chest pain, unspecified: Secondary | ICD-10-CM | POA: Diagnosis not present

## 2021-01-02 NOTE — Patient Instructions (Signed)
Medication Instructions:  Your physician recommends that you continue on your current medications as directed. Please refer to the Current Medication list given to you today.  *If you need a refill on your cardiac medications before your next appointment, please call your pharmacy*   Testing/Procedures:  [place] Monitor Instructions   Your physician has requested you wear a ZIO patch monitor for __3_ days.  This is a single patch monitor.   [employer]hythm supplies one patch monitor per enrollment. Additional stickers are not available. Please do not apply patch if you will be having a Nuclear Stress Test, Echocardiogram, Cardiac CT, MRI, or Chest Xray during the period you would be wearing the monitor. The patch cannot be worn during these tests. You cannot remove and re-apply the ZIO XT patch monitor.  Your ZIO patch monitor will be sent Fed Ex from [employer] directly to your home address. It may take 3-5 days to receive your monitor after you have been enrolled.  Once you have received your monitor, please review the enclosed instructions. Your monitor has already been registered assigning a specific monitor serial # to you.  Billing and Patient Assistance Program Information   We have supplied [employer] with any of your insurance information on file for billing purposes. [employer] offers a sliding scale Patient Assistance Program for patients that do not have insurance, or whose insurance does not completely cover the cost of the ZIO monitor.   You must apply for the Patient Assistance Program to qualify for this discounted rate.     To apply, please call [employer] at [phone], select option 4, then select option 2, and ask to apply for Patient Assistance Program.  Theodore Demark will ask your household income, and how many people are in your household.  They will quote your out-of-pocket cost based on that information.  [employer] will also be able to set up a 5-month, interest-free payment  plan if needed.  Applying the monitor   Shave hair from upper left chest.  Hold abrader disc by orange tab. Rub abrader in 40 strokes over the upper left chest as indicated in your monitor instructions.  Clean area with 4 enclosed alcohol pads. Let dry.  Apply patch as indicated in monitor instructions. Patch will be placed under collarbone on left side of chest with arrow pointing upward.  Rub patch adhesive wings for 2 minutes. Remove white label marked "1". Remove the white label marked "2". Rub patch adhesive wings for 2 additional minutes.  While looking in a mirror, press and release button in center of patch. A small green light will flash 3-4 times. This will be your only indicator that the monitor has been turned on. ?  Do not shower for the first 24 hours. You may shower after the first 24 hours.  Press the button if you feel a symptom. You will hear a small click. Record Date, Time and Symptom in the Patient Logbook.  When you are ready to remove the patch, follow instructions on the last 2 pages of the Patient Logbook. Stick patch monitor onto the last page of Patient Logbook.  Place Patient Logbook in the blue and white box.  Use locking tab on box and tape box closed securely.  The blue and white box has prepaid postage on it. Please place it in the mailbox as soon as possible. Your physician should have your test results approximately 7 days after the monitor has been mailed back to [facility].  Call [employer]  Customer Care at [phone] if you have questions regarding your ZIO XT patch monitor. Call them immediately if you see an orange light blinking on your monitor.  If your monitor falls off in less than 4 days, contact our Monitor department at [phone]. ?If your monitor becomes loose or falls off after 4 days call [employer] at [phone] for suggestions on securing your monitor.?   Follow-Up: At [facility], you and your health needs are our priority.  As  part of our continuing mission to provide you with exceptional heart care, we have created designated Provider Care Teams.  These Care Teams include your primary Cardiologist (physician) and Advanced Practice Providers (APPs -  Physician Assistants and Nurse Practitioners) who all work together to provide you with the care you need, when you need it.  We recommend signing up for the patient portal called "MyChart".  Sign up information is provided on this After Visit Summary.  MyChart is used to connect with patients for Virtual Visits (Telemedicine).  Patients are able to view lab/test results, encounter notes, upcoming appointments, etc.  Non-urgent messages can be sent to your provider as well.   To learn more about what you can do with MyChart, go to [email or web address].    Your next appointment:   1 year(s)  The format for your next appointment:   In Person  Provider:   Gwyndolyn Kaufman, MD

## 2021-01-05 DIAGNOSIS — I493 Ventricular premature depolarization: Secondary | ICD-10-CM | POA: Diagnosis not present

## 2021-01-12 DIAGNOSIS — Z1231 Encounter for screening mammogram for malignant neoplasm of breast: Secondary | ICD-10-CM | POA: Diagnosis not present

## 2021-01-12 LAB — HM MAMMOGRAPHY

## 2021-02-19 DIAGNOSIS — L282 Other prurigo: Secondary | ICD-10-CM | POA: Diagnosis not present

## 2021-02-19 DIAGNOSIS — L400 Psoriasis vulgaris: Secondary | ICD-10-CM | POA: Diagnosis not present

## 2021-02-19 DIAGNOSIS — B078 Other viral warts: Secondary | ICD-10-CM | POA: Diagnosis not present

## 2021-03-08 ENCOUNTER — Ambulatory Visit: Payer: BC Managed Care – PPO | Admitting: Family Medicine

## 2021-03-08 ENCOUNTER — Other Ambulatory Visit: Payer: Self-pay

## 2021-03-08 ENCOUNTER — Encounter: Payer: Self-pay | Admitting: Family Medicine

## 2021-03-08 VITALS — BP 125/90 | HR 78 | Temp 97.8°F

## 2021-03-08 DIAGNOSIS — M7989 Other specified soft tissue disorders: Secondary | ICD-10-CM

## 2021-03-08 DIAGNOSIS — L02522 Furuncle left hand: Secondary | ICD-10-CM | POA: Diagnosis not present

## 2021-03-08 DIAGNOSIS — L089 Local infection of the skin and subcutaneous tissue, unspecified: Secondary | ICD-10-CM | POA: Diagnosis not present

## 2021-03-08 DIAGNOSIS — B958 Unspecified staphylococcus as the cause of diseases classified elsewhere: Secondary | ICD-10-CM | POA: Diagnosis not present

## 2021-03-08 MED ORDER — MUPIROCIN 2 % EX OINT: 1.0000 "application " | TOPICAL_OINTMENT | Freq: Three times a day (TID) | CUTANEOUS | 0 refills | Status: DC

## 2021-03-08 MED ORDER — DOXYCYCLINE HYCLATE 100 MG PO CAPS: 100.0000 mg | ORAL_CAPSULE | Freq: Two times a day (BID) | ORAL | 0 refills | Status: AC

## 2021-03-08 NOTE — Progress Notes (Signed)
OFFICE VISIT  03/08/2021  CC:  Chief Complaint  Patient presents with   Insect Bite    Pt reports poss bug bite from 2 days ago; Pt c/o itching, redness and swelling on L 4 digit    HPI:    Patient is a 60 y.o. Caucasian female who presents for "insect bite on finger". Onset a couple days ago felt pain in L ring finger prox phalanx area dorsal surface, started to turn red and swell locally, then developed a central point.  Some itching today at the area. Last night felt a little pain going up L forearm.  No f/c/malaise. No recollection of anything biting her.  Past Medical History:  Diagnosis Date   Allergy    Full allergy blood testing panel (environmental + foods) NORMAL 06/2018 (Dr. Verlin Fester).   Anemia    Arthritis    rt, shoulder   Asymptomatic cholelithiasis 2019/2020   Atypical chest pain    cardiac w/u normal   Chronic renal insufficiency, stage 3 (moderate) (HCC)    Stage II/III (GFR 50s-60s).  No proteinuria.  ARB added 12/2010 by nephrologist for renal protection.  Cr 1.1, GFR 55 on labs with onc 07/15/17.   Colon cancer ([place]) 08/2014   (METASTATIC TO LIVER 2018)--Initial dx 09/2014-Low anterior resection of rectum-laparoscopic, + chemo X about 8 cycles with dose reductions for toxicity.  2018--CEA continued to rise so PET scan done; liver lesion showed up. Partial hepatectomy 12/2016.  f/u imaging 07/16/17--no signn of recurrence.  Prominence of left adenexa noted so onc ordered pelvic u/s.   Gout    Uric acid level decreased from 9 to 4.1 with addition of uloric 40 mg qd (02/2011).   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  Also hx of PIH.   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  Stable on CT w/contrast 07/16/17, 05/2018, and 09/08/2018.   Postmenopausal    Premature ventricular contractions (PVCs) (VPCs) 2021   stress test normal, echo normal.  Rhythm monitoring ordered but not scheduled as of 08/2020.   Right shoulder pain 2017   Dr. Mardelle Matte: pt did well with injection and PT.  Adhesive capsulitis vs cuff pathology: another injection by Dr. Mardelle Matte 05/20/16 helped x 2 wks.  MRI is next step: pt is considering this.   Seborrheic dermatitis    Face; consider contact derm (around eyes); Lyndle Herrlich 09/2012--desonide 0.05% cream rx'd   Solitary pulmonary nodule 2016/17   Stable on f/u CT imaging as of 10/2015.  Stable/unchanged 4 mm nodule RML on CT chest w/contrast 07/16/17.   Systolic murmur [id number]   echo normal    Past Surgical History:  Procedure Laterality Date   APPENDECTOMY  1964   BREAST REDUCTION SURGERY  2003   bilateral   CARDIOVASCULAR STRESS TEST  05/31/2020   myoc perf img NORMAL/LOW RISK   CESAREAN SECTION  1998   COLON SURGERY  09/2014   [facility]   COLONOSCOPY N/A 08/17/2014   Procedure: COLONOSCOPY;  Surgeon: Ladene Artist, MD;  Location: [facility];  Service: Endoscopy;  Laterality: N/A;   COLONOSCOPY  01/2018   Polypectomy--tubular adenoma with NO high grade dysplasia.  Repeat 1-2 yrs for surveillance.   COLONOSCOPY W/ POLYPECTOMY  11/12/2016   Multiple polyps,some adenomatous but no high grade dysplasia: recall 1 yr for surveillance.   LAPAROSCOPIC PARTIAL HEPATECTOMY  12/11/2016   liver mass excision     LIVER SURGERY     low anter resect rectum  09/12/14   for colon cancer; [facility], [name]   LUMBAR LAMINECTOMY  2004   microdiscectomy   LUMBAR LAMINECTOMY/DECOMPRESSION MICRODISCECTOMY Left 05/21/2013   Procedure: LUMBAR LAMINECTOMY MICRODISCECTOMY L5-S1 LEFT ;  Surgeon: Tobi Bastos, MD;   Location: [facility];  Service: Orthopedics;  Laterality: Left;   NEPHRECTOMY  1964   Wilms tumor (left)   POLYPECTOMY     TRANSTHORACIC ECHOCARDIOGRAM  06/2020   Normal except grd II DD    Outpatient Medications Prior to Visit  Medication Sig Dispense Refill   acetaminophen (TYLENOL) 500 MG tablet Take 1,000 mg by mouth every 6 (six) hours as needed for pain.     calcium carbonate (TUMS - DOSED IN MG ELEMENTAL CALCIUM) 500 MG chewable tablet Chew 1 tablet by mouth daily as needed for indigestion or heartburn.      cetirizine (ZYRTEC) 10 MG chewable tablet Chew 10 mg by mouth as needed for allergies.      colchicine 0.6 MG tablet Take 1 tablet (0.6 mg total) by mouth 2 (two) times daily as needed. for gout flare 60 tablet 2   EPINEPHrine (AUVI-Q) 0.3 mg/0.3 mL IJ SOAJ injection Use as directed for severe allergic reaction 2 Device 2   febuxostat (ULORIC) 40 MG tablet Take 1 tablet (40 mg total) by mouth every morning. 90 tablet 3   irbesartan (AVAPRO) 150 MG tablet Take 1 tablet (150 mg total) by mouth daily. 90 tablet 3   valACYclovir (VALTREX) 1000 MG tablet 2 tabs po q12h x 2 doses 4 tablet 6   fluocinonide (LIDEX) 0.05 % external solution Apply topically.     No facility-administered medications prior to visit.    Allergies  Allergen Reactions   Aldomet [Methyldopa]     Severe bone suppression   Latex Rash    Blisters, rash   Banana Itching   Benzthiazide Other (See Comments)    HCTZ increased gout flares   Kiwi Extract Other (See Comments)    Scratchy throat and itchy ear   Other Other (See Comments)    Scratchy throat and itchy ear Nectarine- itchy throat Kiwi by allergy test  dermabond- vascular reaction   Peach [Prunus Persica] Itching   Plum Pulp Itching   Thiazide-Type Diuretics Other (See Comments)    HCTZ increased gout flares   Crab [Shellfish Allergy] Rash   Tape Rash    ROS As per HPI  PE: Vitals with BMI 03/08/2021 01/02/2021 05/31/2020  Height (No Data)  '5\' 5"'$  '5\' 5"'$   Weight (No Data) 190 lbs 181 lbs  BMI - [id number]  Systolic 0000000 [id number] -  Diastolic 90 84 -  Pulse 78 66 -  Gen: Alert, well appearing.  Patient is oriented to person, place, time, and situation. AFFECT: pleasant, lucid thought and speech. L hand ring finger with STS and erythema surrounding papular lesion with whitish center.   The erythema extends some to dorsum of L hand over MCP region into wrist.  Very TTP over the focal area of swelling around the papule.  ROM of finger  intact but some tightness.  No streaking up her forearm, no tenderness of forearm.  LABS:    Chemistry      Component Value Date/Time   NA 140 05/01/2020 0832   NA 137 07/15/2017 1036   K 4.4 05/01/2020 0832   CL 107 05/01/2020 0832   CO2 25 05/01/2020 0832   BUN 27 (H) 05/01/2020 0832   BUN 23 (A) 07/15/2017 1036   CREATININE 1.12 05/01/2020 0832   GLU 94 07/15/2017 1036      Component Value Date/Time   CALCIUM 9.5 05/01/2020 0832   ALKPHOS 100 05/01/2020 0832   AST 20 05/01/2020 0832   ALT 35 05/01/2020 0832   BILITOT 0.6 05/01/2020 0832     IMPRESSION AND PLAN:  Left finger furuncle, with some surrounding cellulitis.  Suspect staph/strep infection. Will rx for possible MRSA-->bactroban ointment tid to affected area, doxycycline 100 bid x 7d, warm water epsom salt soaks.  An After Visit Summary was printed and given to the patient.  FOLLOW UP: Return if symptoms worsen or fail to improve.  Signed:  Crissie Sickles, MD           03/08/2021

## 2021-04-24 DIAGNOSIS — Z888 Allergy status to other drugs, medicaments and biological substances status: Secondary | ICD-10-CM | POA: Diagnosis not present

## 2021-04-24 DIAGNOSIS — K862 Cyst of pancreas: Secondary | ICD-10-CM | POA: Diagnosis not present

## 2021-04-24 DIAGNOSIS — C19 Malignant neoplasm of rectosigmoid junction: Secondary | ICD-10-CM | POA: Diagnosis not present

## 2021-04-24 DIAGNOSIS — Z08 Encounter for follow-up examination after completed treatment for malignant neoplasm: Secondary | ICD-10-CM | POA: Diagnosis not present

## 2021-04-24 DIAGNOSIS — N289 Disorder of kidney and ureter, unspecified: Secondary | ICD-10-CM | POA: Diagnosis not present

## 2021-04-24 DIAGNOSIS — Z87891 Personal history of nicotine dependence: Secondary | ICD-10-CM | POA: Diagnosis not present

## 2021-04-24 DIAGNOSIS — N2889 Other specified disorders of kidney and ureter: Secondary | ICD-10-CM | POA: Diagnosis not present

## 2021-04-24 DIAGNOSIS — Z9889 Other specified postprocedural states: Secondary | ICD-10-CM | POA: Diagnosis not present

## 2021-04-24 DIAGNOSIS — Z809 Family history of malignant neoplasm, unspecified: Secondary | ICD-10-CM | POA: Diagnosis not present

## 2021-04-24 DIAGNOSIS — Z91040 Latex allergy status: Secondary | ICD-10-CM | POA: Diagnosis not present

## 2021-04-24 DIAGNOSIS — C787 Secondary malignant neoplasm of liver and intrahepatic bile duct: Secondary | ICD-10-CM | POA: Diagnosis not present

## 2021-04-24 DIAGNOSIS — Z9049 Acquired absence of other specified parts of digestive tract: Secondary | ICD-10-CM | POA: Diagnosis not present

## 2021-04-24 DIAGNOSIS — Z85048 Personal history of other malignant neoplasm of rectum, rectosigmoid junction, and anus: Secondary | ICD-10-CM | POA: Diagnosis not present

## 2021-04-24 DIAGNOSIS — Z9221 Personal history of antineoplastic chemotherapy: Secondary | ICD-10-CM | POA: Diagnosis not present

## 2021-04-24 DIAGNOSIS — R918 Other nonspecific abnormal finding of lung field: Secondary | ICD-10-CM | POA: Diagnosis not present

## 2021-04-24 DIAGNOSIS — C189 Malignant neoplasm of colon, unspecified: Secondary | ICD-10-CM | POA: Diagnosis not present

## 2021-05-15 ENCOUNTER — Other Ambulatory Visit: Payer: Self-pay | Admitting: Family Medicine

## 2021-05-29 ENCOUNTER — Other Ambulatory Visit: Payer: Self-pay

## 2021-05-29 ENCOUNTER — Encounter: Payer: Self-pay | Admitting: Family Medicine

## 2021-05-29 ENCOUNTER — Ambulatory Visit: Payer: Managed Care, Other (non HMO) | Admitting: Family Medicine

## 2021-05-29 VITALS — BP 115/81 | HR 73 | Temp 98.1°F | Ht 65.0 in | Wt 190.2 lb

## 2021-05-29 DIAGNOSIS — N182 Chronic kidney disease, stage 2 (mild): Secondary | ICD-10-CM | POA: Diagnosis not present

## 2021-05-29 DIAGNOSIS — Z23 Encounter for immunization: Secondary | ICD-10-CM

## 2021-05-29 DIAGNOSIS — Z0000 Encounter for general adult medical examination without abnormal findings: Secondary | ICD-10-CM

## 2021-05-29 DIAGNOSIS — I10 Essential (primary) hypertension: Secondary | ICD-10-CM

## 2021-05-29 LAB — CBC WITH DIFFERENTIAL/PLATELET
Basophils Absolute: 0.1 10*3/uL (ref 0.0–0.1)
Basophils Relative: 1.4 % (ref 0.0–3.0)
Eosinophils Absolute: 0.2 10*3/uL (ref 0.0–0.7)
Eosinophils Relative: 2.9 % (ref 0.0–5.0)
HCT: 39.5 % (ref 36.0–46.0)
Hemoglobin: 13.2 g/dL (ref 12.0–15.0)
Lymphocytes Relative: 29.4 % (ref 12.0–46.0)
Lymphs Abs: 1.6 10*3/uL (ref 0.7–4.0)
MCHC: 33.3 g/dL (ref 30.0–36.0)
MCV: 93.3 fl (ref 78.0–100.0)
Monocytes Absolute: 0.4 10*3/uL (ref 0.1–1.0)
Monocytes Relative: 7.6 % (ref 3.0–12.0)
Neutro Abs: 3.3 10*3/uL (ref 1.4–7.7)
Neutrophils Relative %: 58.7 % (ref 43.0–77.0)
Platelets: 189 10*3/uL (ref 150.0–400.0)
RBC: 4.23 Mil/uL (ref 3.87–5.11)
RDW: 13.2 % (ref 11.5–15.5)
WBC: 5.5 10*3/uL (ref 4.0–10.5)

## 2021-05-29 LAB — LIPID PANEL
Cholesterol: 176 mg/dL (ref 0–200)
HDL: 58.8 mg/dL (ref >39.00)
LDL Cholesterol: 98 mg/dL (ref 0–99)
NonHDL: 117.66
Total CHOL/HDL Ratio: 3
Triglycerides: 98 mg/dL (ref 0.0–149.0)
VLDL: 19.6 mg/dL (ref 0.0–40.0)

## 2021-05-29 LAB — COMPREHENSIVE METABOLIC PANEL
ALT: 17 U/L (ref 0–35)
AST: 19 U/L (ref 0–37)
Albumin: 4.2 g/dL (ref 3.5–5.2)
Alkaline Phosphatase: 88 U/L (ref 39–117)
BUN: 25 mg/dL — ABNORMAL HIGH (ref 6–23)
CO2: 23 mEq/L (ref 19–32)
Calcium: 10.3 mg/dL (ref 8.4–10.5)
Chloride: 105 mEq/L (ref 96–112)
Creatinine, Ser: 1.22 mg/dL — ABNORMAL HIGH (ref 0.40–1.20)
GFR: 48.09 mL/min — ABNORMAL LOW (ref >60.00)
Glucose, Bld: 105 mg/dL — ABNORMAL HIGH (ref 70–99)
Potassium: 5 mEq/L (ref 3.5–5.1)
Sodium: 138 mEq/L (ref 135–145)
Total Bilirubin: 0.6 mg/dL (ref 0.2–1.2)
Total Protein: 6.8 g/dL (ref 6.0–8.3)

## 2021-05-29 LAB — TSH: TSH: 3.14 u[IU]/mL (ref 0.35–5.50)

## 2021-05-29 NOTE — Progress Notes (Signed)
Office Note 05/29/2021  CC:  Chief Complaint  Patient presents with   Follow-up    RCI, pt is fasting    HPI:  Patient is a 60 y.o. female who is here for annual health maintenance exam and f/u HTN and CRI II/III.   Roselyne report she has been feeling well.  Recently some intermittent pinching pain in the left hip usually when walking, goes away with rest.  Does not last very long.  Also some left low back pain intermittently, sometimes has her in the bed for a day or so and then resolves.  She remains cancer free on follow-ups with [facility]. No home blood pressure monitoring data.  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  Past Medical History:  Diagnosis Date   Allergy    Full allergy blood testing panel (environmental + foods) NORMAL 06/2018 (Dr. Verlin Fester).   Anemia    Arthritis    rt, shoulder   Asymptomatic cholelithiasis 2019/2020   Atypical chest pain    cardiac w/u normal   Chronic renal insufficiency, stage 3 (moderate) (HCC)    Stage II/III (GFR 50s-60s).  No proteinuria.  ARB added 12/2010 by nephrologist for renal protection.  Cr 1.1, GFR 55 on labs with onc 07/15/17.   Colon cancer ([place]) 08/2014   (METASTATIC TO LIVER 2018)--Initial dx 09/2014-Low anterior resection of rectum-laparoscopic, + chemo X about 8 cycles with dose reductions for toxicity.  2018--CEA continued to rise so PET scan done; liver lesion showed up. Partial hepatectomy 12/2016.  f/u imaging 07/16/17--no signn of recurrence.  Prominence of left adenexa noted so onc ordered pelvic u/s.   Gout    Uric acid level decreased from 9 to 4.1 with addition of uloric 40 mg qd (02/2011).   History of anemia    History of blood transfusion 1963   History of nephrectomy, unilateral 1964   Wilms tumor at age 28 (right kidney)   History of subacute thyroiditis    HTN (hypertension)    since age 9.  Also hx of PIH.   Left renal mass 08/2014   Two: one likely a small (<2 cm)  angiomyolipoma, the 2nd a multilocular cystic mass being followed expectantly--reimaging stable 08/2015, 02/2016, and 07/2017, 05/2018, and 09/08/2018 (oncology).   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  Stable on CT w/contrast 07/16/17, 05/2018, and 09/08/2018.   Postmenopausal    Premature ventricular contractions (PVCs) (VPCs) 2021   stress test normal, echo normal.  Rhythm monitoring ordered but not scheduled as of 08/2020.   Right shoulder pain 2017   Dr. Mardelle Matte: pt did well with injection and PT.  Adhesive capsulitis vs cuff pathology: another injection by Dr. Mardelle Matte 05/20/16 helped x 2 wks.  MRI is next step: pt is considering this.   Seborrheic dermatitis    Face; consider contact derm (around eyes); Lyndle Herrlich 09/2012--desonide 0.05% cream rx'd   Solitary pulmonary nodule 2016/17   Stable on f/u CT imaging as of 10/2015.  Stable/unchanged 4 mm nodule RML on CT chest w/contrast 07/16/17.   Systolic murmur 92/1194   echo normal    Past Surgical History:  Procedure Laterality Date   APPENDECTOMY  1964   BREAST REDUCTION SURGERY  2003   bilateral   CARDIOVASCULAR STRESS TEST  05/31/2020   myoc perf img NORMAL/LOW RISK   CESAREAN SECTION  1998   COLON SURGERY  09/2014   [facility]  COLONOSCOPY N/A 08/17/2014   Procedure: COLONOSCOPY;  Surgeon: Ladene Artist, MD;  Location: [facility];  Service: Endoscopy;  Laterality: N/A;   COLONOSCOPY  01/2018   Polypectomy--tubular adenoma with NO high grade dysplasia.  Repeat 1-2 yrs for surveillance.   COLONOSCOPY W/ POLYPECTOMY  11/12/2016   Multiple polyps,some adenomatous but no high grade dysplasia: recall 1 yr for surveillance.   LAPAROSCOPIC PARTIAL HEPATECTOMY  12/11/2016   liver mass excision     LIVER SURGERY     low anter resect rectum  09/12/14   for colon cancer; [facility], [name]   LUMBAR LAMINECTOMY  2004   microdiscectomy   LUMBAR LAMINECTOMY/DECOMPRESSION MICRODISCECTOMY Left  05/21/2013   Procedure: LUMBAR LAMINECTOMY MICRODISCECTOMY L5-S1 LEFT ;  Surgeon: Tobi Bastos, MD;  Location: [facility];  Service: Orthopedics;  Laterality: Left;   NEPHRECTOMY  1964   Wilms tumor (left)   POLYPECTOMY     TRANSTHORACIC ECHOCARDIOGRAM  06/2020   Normal except grd II DD    Family History  Problem Relation Age of Onset   Hypertension Father    Heart disease Father    Alzheimer's disease Father    Crohn's disease Father    Colon cancer Father        diagnosed 40's   Hypertension Mother    Heart disease Mother    COPD Mother    Colon polyps Sister    Colon polyps Brother    Esophageal cancer Neg Hx    Rectal cancer Neg Hx    Stomach cancer Neg Hx     Social History   Socioeconomic History   Marital status: Married    Spouse name: Not on file   Number of children: Not on file   Years of education: Not on file   Highest education level: Not on file  Occupational History   Not on file  Tobacco Use   Smoking status: Former    Types: Cigarettes   Smokeless tobacco: Never   Tobacco comments:    light in college  Vaping Use   Vaping Use: Never used  Substance and Sexual Activity   Alcohol use: Yes    Alcohol/week: 1.0 standard drink    Types: 1 Cans of beer per week    Comment: holidays and special occasions; 3-4 times yearly   Drug use: No    Frequency: 1.0 times per week   Sexual activity: Yes  Other Topics Concern   Not on file  Social History Narrative   Married, one child.   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   Relocated to [place] from [place], [place] 2013.    No Tobacco.  Rare alcohol.  No drug use.    Social Determinants of Health   Financial Resource Strain: Not on file  Food Insecurity: Not on file  Transportation Needs: Not on file  Physical Activity: Not on file  Stress: Not on file  Social Connections: Not on file  Intimate Partner Violence: Not on file    Outpatient Medications Prior to Visit  Medication Sig Dispense  Refill   acetaminophen (TYLENOL) 500 MG tablet Take 1,000 mg by mouth every 6 (six) hours as needed for pain.     calcium carbonate (TUMS - DOSED IN MG ELEMENTAL CALCIUM) 500 MG chewable tablet Chew 1 tablet by mouth daily as needed for indigestion or heartburn.      cetirizine (ZYRTEC) 10 MG chewable tablet Chew 10 mg by mouth as needed for allergies.  colchicine 0.6 MG tablet Take 1 tablet (0.6 mg total) by mouth 2 (two) times daily as needed. for gout flare 60 tablet 2   febuxostat (ULORIC) 40 MG tablet Take 1 tablet (40 mg total) by mouth every morning. 90 tablet 3   fluocinonide (LIDEX) 0.05 % external solution Apply topically.     irbesartan (AVAPRO) 150 MG tablet Take 1 tablet (150 mg total) by mouth daily. 90 tablet 3   EPINEPHrine (AUVI-Q) 0.3 mg/0.3 mL IJ SOAJ injection Use as directed for severe allergic reaction (Patient not taking: Reported on 05/29/2021) 2 Device 2   valACYclovir (VALTREX) 1000 MG tablet 2 tabs po q12h x 2 doses (Patient not taking: Reported on 05/29/2021) 4 tablet 6   mupirocin ointment (BACTROBAN) 2 % Apply 1 application topically 3 (three) times daily. (Patient not taking: Reported on 05/29/2021) 15 g 0   No facility-administered medications prior to visit.    Allergies  Allergen Reactions   Aldomet [Methyldopa]     Severe bone suppression   Latex Rash    Blisters, rash   Banana Itching   Benzthiazide Other (See Comments)    HCTZ increased gout flares   Kiwi Extract Other (See Comments)    Scratchy throat and itchy ear   Other Other (See Comments)    Scratchy throat and itchy ear Nectarine- itchy throat Kiwi by allergy test  dermabond- vascular reaction   Peach [Prunus Persica] Itching   Plum Pulp Itching   Thiazide-Type Diuretics Other (See Comments)    HCTZ increased gout flares   Crab [Shellfish Allergy] Rash   Tape Rash    ROS Review of Systems  Constitutional:  Negative for appetite change, chills, fatigue and fever.  HENT:   Negative for congestion, dental problem, ear pain and sore throat.   Eyes:  Negative for discharge, redness and visual disturbance.  Respiratory:  Negative for cough, chest tightness, shortness of breath and wheezing.   Cardiovascular:  Negative for chest pain, palpitations and leg swelling.  Gastrointestinal:  Negative for abdominal pain, blood in stool, diarrhea, nausea and vomiting.  Genitourinary:  Negative for difficulty urinating, dysuria, flank pain, frequency, hematuria and urgency.  Musculoskeletal:  Positive for back pain. Negative for arthralgias, joint swelling, myalgias and neck stiffness.  Skin:  Negative for pallor and rash.  Neurological:  Negative for dizziness, speech difficulty, weakness and headaches.  Hematological:  Negative for adenopathy. Does not bruise/bleed easily.  Psychiatric/Behavioral:  Negative for confusion and sleep disturbance. The patient is not nervous/anxious.    PE; Vitals with BMI 05/29/2021 03/08/2021 01/02/2021  Height 5\' 5"  (No Data) 5\' 5"   Weight 190 lbs 3 oz (No Data) 190 lbs  BMI 07.37 - 10.62  Systolic [phone]  Diastolic 81 90 84  Pulse 73 78 66  Exam chaperoned by Shepard General, CMA  Gen: Alert, well appearing.  Patient is oriented to person, place, time, and situation. AFFECT: pleasant, lucid thought and speech. ENT: Ears: EACs clear, normal epithelium.  TMs with good light reflex and landmarks bilaterally.  Eyes: no injection, icteris, swelling, or exudate.  EOMI, PERRLA. Nose: no drainage or turbinate edema/swelling.  No injection or focal lesion.  Mouth: lips without lesion/swelling.  Oral mucosa pink and moist.  Dentition intact and without obvious caries or gingival swelling.  Oropharynx without erythema, exudate, or swelling.  Neck: supple/nontender.  No LAD, mass, or TM.  Carotid pulses 2+ bilaterally, without bruits. CV: RRR, no m/r/g.   LUNGS: CTA bilat, nonlabored resps, good  aeration in all lung fields. ABD: soft, NT, ND, BS  normal.  No hepatospenomegaly or mass.  No bruits. EXT: no clubbing, cyanosis, or edema.  Musculoskeletal: no joint swelling, erythema, warmth, or tenderness.  ROM of all joints intact. Skin - no sores or suspicious lesions or rashes or color changes  Pertinent labs:  Lab Results  Component Value Date   TSH 2.37 05/01/2020   Lab Results  Component Value Date   WBC 4.8 05/01/2020   HGB 13.0 05/01/2020   HCT 39.7 05/01/2020   MCV 95.1 05/01/2020   PLT 179.0 05/01/2020   Lab Results  Component Value Date   CREATININE 1.12 05/01/2020   BUN 27 (H) 05/01/2020   NA 140 05/01/2020   K 4.4 05/01/2020   CL 107 05/01/2020   CO2 25 05/01/2020   Lab Results  Component Value Date   ALT 35 05/01/2020   AST 20 05/01/2020   ALKPHOS 100 05/01/2020   BILITOT 0.6 05/01/2020   Lab Results  Component Value Date   CHOL 158 05/01/2020   Lab Results  Component Value Date   HDL 53.30 05/01/2020   Lab Results  Component Value Date   LDLCALC 91 05/01/2020   Lab Results  Component Value Date   TRIG 70.0 05/01/2020   Lab Results  Component Value Date   CHOLHDL 3 05/01/2020    ASSESSMENT AND PLAN:   1) HTN: well controlled. Lytes/cr today.  2) CRI II/III, hx of right nephrectomy age 25 for wilm's tumor-->lytes/cr today. Avoid NSAIDs, hydrate.  3) intermittent left low back pain, left hip pain intermittently as well: This sounds musculoskeletal and she is completely without problem today.  She mainly mentioned it due to the mention of Paget's disease of bone and her surveillance CT imaging through [facility].  We will take a look at these records to see if any further investigation is warranted.  4) Health maintenance exam: Reviewed age and gender appropriate health maintenance issues (prudent diet, regular exercise, health risks of tobacco and excessive alcohol, use of seatbelts, fire alarms in home, use of sunscreen).  Also reviewed age and gender appropriate health screening as  well as vaccine recommendations. Vaccines: Prevnar 20->given today.  Tdap-->deferred for now.  Flu->given today.  Shingrix->deferred today. Labs: fasting HP Cervical ca screening: per GYN->she is due. Breast ca screening: UTD via [facility] 12/2020. Colon ca screening: hx colon ca + resection, last colonoscopy 02/2020 with 1 adenoma, rpt approx 02/2022.  An After Visit Summary was printed and given to the patient.  FOLLOW UP:  No follow-ups on file.  Signed:  Crissie Sickles, MD           05/29/2021

## 2021-05-29 NOTE — Patient Instructions (Signed)
Health Maintenance, Female Adopting a healthy lifestyle and getting preventive care are important in promoting health and wellness. Ask your health care provider about: The right schedule for you to have regular tests and exams. Things you can do on your own to prevent diseases and keep yourself healthy. What should I know about diet, weight, and exercise? Eat a healthy diet  Eat a diet that includes plenty of vegetables, fruits, low-fat dairy products, and lean protein. Do not eat a lot of foods that are high in solid fats, added sugars, or sodium. Maintain a healthy weight Body mass index (BMI) is used to identify weight problems. It estimates body fat based on height and weight. Your health care provider can help determine your BMI and help you achieve or maintain a healthy weight. Get regular exercise Get regular exercise. This is one of the most important things you can do for your health. Most adults should: Exercise for at least 150 minutes each week. The exercise should increase your heart rate and make you sweat (moderate-intensity exercise). Do strengthening exercises at least twice a week. This is in addition to the moderate-intensity exercise. Spend less time sitting. Even light physical activity can be beneficial. Watch cholesterol and blood lipids Have your blood tested for lipids and cholesterol at 61 years of age, then have this test every 5 years. Have your cholesterol levels checked more often if: Your lipid or cholesterol levels are high. You are older than 61 years of age. You are at high risk for heart disease. What should I know about cancer screening? Depending on your health history and family history, you may need to have cancer screening at various ages. This may include screening for: Breast cancer. Cervical cancer. Colorectal cancer. Skin cancer. Lung cancer. What should I know about heart disease, diabetes, and high blood pressure? Blood pressure and heart  disease High blood pressure causes heart disease and increases the risk of stroke. This is more likely to develop in people who have high blood pressure readings, are of African descent, or are overweight. Have your blood pressure checked: Every 3-5 years if you are 18-39 years of age. Every year if you are 40 years old or older. Diabetes Have regular diabetes screenings. This checks your fasting blood sugar level. Have the screening done: Once every three years after age 40 if you are at a normal weight and have a low risk for diabetes. More often and at a younger age if you are overweight or have a high risk for diabetes. What should I know about preventing infection? Hepatitis B If you have a higher risk for hepatitis B, you should be screened for this virus. Talk with your health care provider to find out if you are at risk for hepatitis B infection. Hepatitis C Testing is recommended for: Everyone born from 1945 through 1965. Anyone with known risk factors for hepatitis C. Sexually transmitted infections (STIs) Get screened for STIs, including gonorrhea and chlamydia, if: You are sexually active and are younger than 61 years of age. You are older than 61 years of age and your health care provider tells you that you are at risk for this type of infection. Your sexual activity has changed since you were last screened, and you are at increased risk for chlamydia or gonorrhea. Ask your health care provider if you are at risk. Ask your health care provider about whether you are at high risk for HIV. Your health care provider may recommend a prescription medicine   to help prevent HIV infection. If you choose to take medicine to prevent HIV, you should first get tested for HIV. You should then be tested every 3 months for as long as you are taking the medicine. Pregnancy If you are about to stop having your period (premenopausal) and you may become pregnant, seek counseling before you get  pregnant. Take 400 to 800 micrograms (mcg) of folic acid every day if you become pregnant. Ask for birth control (contraception) if you want to prevent pregnancy. Osteoporosis and menopause Osteoporosis is a disease in which the bones lose minerals and strength with aging. This can result in bone fractures. If you are 65 years old or older, or if you are at risk for osteoporosis and fractures, ask your health care provider if you should: Be screened for bone loss. Take a calcium or vitamin D supplement to lower your risk of fractures. Be given hormone replacement therapy (HRT) to treat symptoms of menopause. Follow these instructions at home: Lifestyle Do not use any products that contain nicotine or tobacco, such as cigarettes, e-cigarettes, and chewing tobacco. If you need help quitting, ask your health care provider. Do not use street drugs. Do not share needles. Ask your health care provider for help if you need support or information about quitting drugs. Alcohol use Do not drink alcohol if: Your health care provider tells you not to drink. You are pregnant, may be pregnant, or are planning to become pregnant. If you drink alcohol: Limit how much you use to 0-1 drink a day. Limit intake if you are breastfeeding. Be aware of how much alcohol is in your drink. In the [place] one drink equals one 12 oz bottle of beer (355 mL), one 5 oz glass of wine (148 mL), or one 1 oz glass of hard liquor (44 mL). General instructions Schedule regular health, dental, and eye exams. Stay current with your vaccines. Tell your health care provider if: You often feel depressed. You have ever been abused or do not feel safe at home. Summary Adopting a healthy lifestyle and getting preventive care are important in promoting health and wellness. Follow your health care provider's instructions about healthy diet, exercising, and getting tested or screened for diseases. Follow your health care provider's  instructions on monitoring your cholesterol and blood pressure. This information is not intended to replace advice given to you by your health care provider. Make sure you discuss any questions you have with your health care provider. Document Revised: 10/06/2020 Document Reviewed: 07/22/2018 Elsevier Patient Education  2022 Elsevier Inc.  

## 2021-07-04 ENCOUNTER — Telehealth: Payer: Self-pay | Admitting: Family Medicine

## 2021-07-04 MED ORDER — MOLNUPIRAVIR EUA 200MG CAPSULE: 4.0000 | ORAL_CAPSULE | Freq: Two times a day (BID) | ORAL | 0 refills | Status: AC

## 2021-07-04 NOTE — Telephone Encounter (Signed)
Pt tested positive this morning. Symptoms started Monday with cough, runny nose. Denies loss of smell or sore throat. Had chills last night. Last covid booster was last year. Hx of cancer. Has been alternating with tylenol and motrin. Has not been quarantining with her husband.   Please review and advise

## 2021-07-04 NOTE — Telephone Encounter (Signed)
(  Pt's RFs for complication from covid-->BMI 31, CRI III. She is w/in treatment window for oral antiviral for covid.   GFR 48-->paxlovid contraindicated.  Will rx molnupiravir.)  Molnupiravir eRx'd

## 2021-07-04 NOTE — Telephone Encounter (Signed)
Pt aware rx sent and Rx instructions.

## 2021-07-04 NOTE — Telephone Encounter (Signed)
Pt tested positve for covid and is high risk ... requesting to speak with a nurse and Rx.

## 2021-07-30 DIAGNOSIS — H608X3 Other otitis externa, bilateral: Secondary | ICD-10-CM | POA: Insufficient documentation

## 2021-08-30 ENCOUNTER — Other Ambulatory Visit: Payer: Self-pay | Admitting: Family Medicine

## 2021-08-31 ENCOUNTER — Other Ambulatory Visit: Payer: Self-pay

## 2021-08-31 ENCOUNTER — Other Ambulatory Visit: Payer: Self-pay | Admitting: Family Medicine

## 2021-08-31 ENCOUNTER — Telehealth: Payer: Self-pay | Admitting: Family Medicine

## 2021-08-31 MED ORDER — IRBESARTAN 150 MG PO TABS: 150.0000 mg | ORAL_TABLET | Freq: Every day | ORAL | 0 refills | Status: DC

## 2021-08-31 NOTE — Telephone Encounter (Signed)
Pt called about med refill for Febuxostat & irbesartan  [facility] informed pt that we denied medication  febuxostat febuxostat (ULORIC) 40 MG tablet  Pt wanted a 90 day supply Informed pt we would need to schedule an appointment, it's been awhile since she's been seen. Pt was confused   Pt cell: [place], Elaine Jenkins AT [address] Phone:  [phone]  Fax:  [phone]

## 2021-08-31 NOTE — Telephone Encounter (Signed)
°  Note 4:24PM Pt advised refills sent. We received duplicate request for Uloric so one is showing as approved and the other denied. Pt voiced understanding.

## 2021-08-31 NOTE — Telephone Encounter (Signed)
Pt advised refills sent. We received duplicate request for Uloric so one is showing as approved and the other denied. Pt voiced understanding.

## 2021-08-31 NOTE — Telephone Encounter (Signed)
Patient states she needs 2 prescriptions refills and pharmacy only sent 1 request.  [facility] - [address]  irbesartan (AVAPRO) 150 MG tablet [[id number]]   febuxostat (ULORIC) 40 MG tablet [[id number]]

## 2021-09-01 ENCOUNTER — Other Ambulatory Visit: Payer: Self-pay | Admitting: Family Medicine

## 2021-09-03 ENCOUNTER — Other Ambulatory Visit: Payer: Self-pay

## 2021-09-03 ENCOUNTER — Other Ambulatory Visit: Payer: Self-pay | Admitting: Family Medicine

## 2021-09-03 NOTE — Telephone Encounter (Signed)
Pt advised PA will be required. Pt states she will pay out of pocket for medication.

## 2021-09-03 NOTE — Telephone Encounter (Signed)
Confirmed with the pharmacy, recent rx was not received. Rx resent and advised PA would be needed for coverage.

## 2021-09-03 NOTE — Telephone Encounter (Signed)
Pt called again inquiring about Uloric medication refill. She was unable to receive refill over the weekend and pharmacy sent [place] a new request but it was denied due to rx sent on 08/31/21. Advised pt we would follow up and get back with her with details. She voiced understanding.

## 2021-09-04 NOTE — Telephone Encounter (Signed)
FYI  PA completed, key #[id number]. Pt advised we would receive a response within 72 business hours.

## 2021-09-04 NOTE — Telephone Encounter (Addendum)
Pt wanted to talk to [facility] but she was with a patient, she said that the pharmacy said that the formulary changed with the insurance and that they were covering alpurinol but that she has been on Uloric for years and that they needed a statement from Dr Anitra Lauth in order to cover it. Call back for pt is [phone]

## 2021-09-05 NOTE — Telephone Encounter (Signed)
Noted  

## 2021-10-08 ENCOUNTER — Ambulatory Visit: Payer: BC Managed Care – PPO | Admitting: Family Medicine

## 2021-10-22 ENCOUNTER — Ambulatory Visit: Payer: Self-pay | Admitting: Family Medicine

## 2021-11-08 ENCOUNTER — Other Ambulatory Visit: Payer: Self-pay | Admitting: Family Medicine

## 2021-11-27 ENCOUNTER — Ambulatory Visit: Payer: BC Managed Care – PPO | Admitting: Family Medicine

## 2021-12-18 DIAGNOSIS — K862 Cyst of pancreas: Secondary | ICD-10-CM | POA: Diagnosis not present

## 2021-12-18 DIAGNOSIS — N281 Cyst of kidney, acquired: Secondary | ICD-10-CM | POA: Diagnosis not present

## 2021-12-18 DIAGNOSIS — Z86010 Personal history of colonic polyps: Secondary | ICD-10-CM | POA: Diagnosis not present

## 2021-12-18 DIAGNOSIS — C189 Malignant neoplasm of colon, unspecified: Secondary | ICD-10-CM | POA: Diagnosis not present

## 2021-12-18 DIAGNOSIS — N289 Disorder of kidney and ureter, unspecified: Secondary | ICD-10-CM | POA: Diagnosis not present

## 2021-12-18 DIAGNOSIS — C19 Malignant neoplasm of rectosigmoid junction: Secondary | ICD-10-CM | POA: Diagnosis not present

## 2021-12-18 DIAGNOSIS — R918 Other nonspecific abnormal finding of lung field: Secondary | ICD-10-CM | POA: Diagnosis not present

## 2021-12-18 DIAGNOSIS — C787 Secondary malignant neoplasm of liver and intrahepatic bile duct: Secondary | ICD-10-CM | POA: Diagnosis not present

## 2021-12-18 LAB — BASIC METABOLIC PANEL
BUN: 24 — AB (ref 4–21)
CO2: 25 — AB (ref 13–22)
Chloride: 105 (ref 99–108)
Creatinine: 1.2 — AB (ref 0.5–1.1)
Glucose: 108
Potassium: 4.1 mEq/L (ref 3.5–5.1)
Sodium: 136 — AB (ref 137–147)

## 2021-12-18 LAB — CBC: RBC: 3.97 (ref 3.87–5.11)

## 2021-12-18 LAB — CBC AND DIFFERENTIAL
HCT: 37 (ref 36–46)
Hemoglobin: 12.4 (ref 12.0–16.0)
Platelets: 171 10*3/uL (ref 150–400)
WBC: 5.1

## 2021-12-18 LAB — HEPATIC FUNCTION PANEL
ALT: 19 U/L (ref 7–35)
AST: 19 (ref 13–35)
Alkaline Phosphatase: 82 (ref 25–125)
Bilirubin, Total: 0.6

## 2021-12-18 LAB — COMPREHENSIVE METABOLIC PANEL
Albumin: 4.1 (ref 3.5–5.0)
Calcium: 10.1 (ref 8.7–10.7)
eGFR: 51

## 2021-12-20 ENCOUNTER — Telehealth: Payer: Self-pay

## 2021-12-20 ENCOUNTER — Other Ambulatory Visit: Payer: Self-pay | Admitting: Family Medicine

## 2021-12-20 MED ORDER — IRBESARTAN 150 MG PO TABS: ORAL_TABLET | ORAL | 1 refills | Status: DC

## 2021-12-20 NOTE — Telephone Encounter (Signed)
Thank you for informing me.  I appreciate you! ?

## 2021-12-20 NOTE — Telephone Encounter (Signed)
Contacted pt to schedule CPE because she is almost due for mammogram. Pt states that she gets them done yearly at [facility] and is already scheduled. Pt then stated that she is needing refills on medication and that when she went to the pharmacy there was an issue with insurance coverage so she paid for 30 d/s of irbesartan and it says she does not have any refills. Advised pt that she is overdue for f/u appt and pt could not understand why she needed to have an appt. She stated that she just had a full work up with blood work on Tuesday with [facility] and PCP should be able to see that. Pt was advised that the follow up is to make sure medication is working properly and make changes if needed. Pt refused to schedule appt because she does not feel it is necessary. Stating that she will only schedule an appt if PCP sees something wrong that she is not aware of and if so she wants to know what it is and she will decide if it is necessary for her to schedule an appt. States that if she is forced to make an appt that she feels is unnecessary she will report it. ? ?RF request for irbesartan and uloric ?LOV: 05/29/21 ?Next ov: n/a ?Last written: 11/08/21 (90,0);09/03/21 (90,0) ? ?

## 2021-12-24 ENCOUNTER — Telehealth: Payer: Self-pay

## 2021-12-24 MED ORDER — FEBUXOSTAT 40 MG PO TABS: ORAL_TABLET | ORAL | 0 refills | Status: DC

## 2021-12-24 NOTE — Telephone Encounter (Signed)
Patient refill request.  [address] ? ?febuxostat (ULORIC) 40 MG tablet [[id number]]  ?

## 2021-12-25 DIAGNOSIS — L400 Psoriasis vulgaris: Secondary | ICD-10-CM | POA: Diagnosis not present

## 2021-12-25 DIAGNOSIS — D2261 Melanocytic nevi of right upper limb, including shoulder: Secondary | ICD-10-CM | POA: Diagnosis not present

## 2021-12-25 DIAGNOSIS — L723 Sebaceous cyst: Secondary | ICD-10-CM | POA: Diagnosis not present

## 2021-12-25 DIAGNOSIS — D1801 Hemangioma of skin and subcutaneous tissue: Secondary | ICD-10-CM | POA: Diagnosis not present

## 2021-12-28 NOTE — Telephone Encounter (Signed)
[employer] called stating a PA is needed for this medication. Have not receive PA

## 2021-12-28 NOTE — Telephone Encounter (Signed)
PA paper received by fax

## 2021-12-31 NOTE — Telephone Encounter (Signed)
PA completed and faxed back

## 2022-01-02 NOTE — Telephone Encounter (Signed)
When you did the PA did you say she had renal insufficiency? She should be able to get Uloric approved based on diagnosis of chronic renal insufficiency stage III. Let me know.

## 2022-01-02 NOTE — Telephone Encounter (Signed)
Pt was informed PA denied. PA will not be approved until generic allopurinol has been tried and did not work or was not tolerated. Pt would like provider to complete appeal. She only has 2 days left of uloric medication  Please review and advise

## 2022-01-03 NOTE — Telephone Encounter (Signed)
Provider called [employer] [place] clinical pharmacist and was notified we would submit new PA on covermymeds or resubmit paper PA provided with necessary changes.   Elaine Jenkins (Key: [id number])   This request has received a Cancelled outcome.  This may mean either your patient does not have active coverage with this plan, this authorization was processed as a duplicate request, or an authorization was not needed for this medication.  Note any additional information provided by [facility] at the bottom of this request, and contact [employer] directly for further details.

## 2022-01-03 NOTE — Telephone Encounter (Addendum)
I did not specifically state CKD but it was mentioned she is unable to tolerate steroids due to having one kidney. The patient must have a written appeal request along with any other supporting documents. Last OV 05/29/21 was printed for documentation.  Please review and advise

## 2022-01-04 MED ORDER — FEBUXOSTAT 40 MG PO TABS
ORAL_TABLET | ORAL | 11 refills | Status: DC
Start: 2022-01-04 — End: 2022-11-25

## 2022-01-04 MED ORDER — FEBUXOSTAT 40 MG PO TABS: ORAL_TABLET | ORAL | 3 refills | Status: DC

## 2022-01-04 NOTE — Telephone Encounter (Signed)
Uloric rx printed

## 2022-01-04 NOTE — Telephone Encounter (Signed)
Pt informed and will come pick up rx today

## 2022-01-04 NOTE — Addendum Note (Signed)
Addended by: Tammi Sou on: 01/04/2022 04:41 PM   Modules accepted: Orders

## 2022-01-04 NOTE — Telephone Encounter (Signed)
Pt wrote a long detailed letter for appeal. She has enough until Sun or Monday. She may be able to get the medication using goodrx coupon. Pt would like to know if you would write a written prescription she can take to the pharmacy.

## 2022-01-24 DIAGNOSIS — H2513 Age-related nuclear cataract, bilateral: Secondary | ICD-10-CM | POA: Diagnosis not present

## 2022-01-24 DIAGNOSIS — H52223 Regular astigmatism, bilateral: Secondary | ICD-10-CM | POA: Diagnosis not present

## 2022-01-24 DIAGNOSIS — H0288B Meibomian gland dysfunction left eye, upper and lower eyelids: Secondary | ICD-10-CM | POA: Diagnosis not present

## 2022-01-24 DIAGNOSIS — H5213 Myopia, bilateral: Secondary | ICD-10-CM | POA: Diagnosis not present

## 2022-01-24 DIAGNOSIS — H0288A Meibomian gland dysfunction right eye, upper and lower eyelids: Secondary | ICD-10-CM | POA: Diagnosis not present

## 2022-01-24 DIAGNOSIS — H353131 Nonexudative age-related macular degeneration, bilateral, early dry stage: Secondary | ICD-10-CM | POA: Diagnosis not present

## 2022-01-29 DIAGNOSIS — L218 Other seborrheic dermatitis: Secondary | ICD-10-CM | POA: Diagnosis not present

## 2022-01-29 DIAGNOSIS — Z419 Encounter for procedure for purposes other than remedying health state, unspecified: Secondary | ICD-10-CM | POA: Diagnosis not present

## 2022-01-30 ENCOUNTER — Other Ambulatory Visit: Payer: Self-pay

## 2022-01-30 MED ORDER — VALACYCLOVIR HCL 1 G PO TABS: ORAL_TABLET | ORAL | 6 refills | Status: DC

## 2022-02-20 DIAGNOSIS — H608X3 Other otitis externa, bilateral: Secondary | ICD-10-CM | POA: Diagnosis not present

## 2022-02-27 ENCOUNTER — Other Ambulatory Visit: Payer: Self-pay

## 2022-02-28 ENCOUNTER — Encounter: Payer: Self-pay | Admitting: Family Medicine

## 2022-02-28 ENCOUNTER — Ambulatory Visit (INDEPENDENT_AMBULATORY_CARE_PROVIDER_SITE_OTHER): Payer: BC Managed Care – PPO | Admitting: Family Medicine

## 2022-02-28 VITALS — BP 128/89 | HR 75 | Ht 65.0 in | Wt 191.0 lb

## 2022-02-28 DIAGNOSIS — B001 Herpesviral vesicular dermatitis: Secondary | ICD-10-CM

## 2022-02-28 DIAGNOSIS — M1000 Idiopathic gout, unspecified site: Secondary | ICD-10-CM | POA: Diagnosis not present

## 2022-02-28 DIAGNOSIS — N183 Chronic kidney disease, stage 3 unspecified: Secondary | ICD-10-CM | POA: Diagnosis not present

## 2022-02-28 DIAGNOSIS — I10 Essential (primary) hypertension: Secondary | ICD-10-CM | POA: Diagnosis not present

## 2022-02-28 MED ORDER — ACYCLOVIR 5 % EX OINT: 1.0000 | TOPICAL_OINTMENT | CUTANEOUS | 3 refills | Status: DC

## 2022-02-28 MED ORDER — EPINEPHRINE 0.3 MG/0.3ML IJ SOAJ: INTRAMUSCULAR | 2 refills | Status: DC

## 2022-02-28 MED ORDER — IRBESARTAN 150 MG PO TABS: ORAL_TABLET | ORAL | 3 refills | Status: DC

## 2022-02-28 NOTE — Progress Notes (Signed)
OFFICE VISIT  02/28/2022  CC:  Chief Complaint  Patient presents with   Hypertension   Patient is a 61 y.o. female who presents for 43-monthfollow-up hypertension and chronic renal insufficiency. A/P as of last visit: "1) HTN: well controlled. Lytes/cr today.   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 Avoid NSAIDs, hydrate.   3) intermittent left low back pain, left hip pain intermittently as well: This sounds musculoskeletal and she is completely without problem today.  She mainly mentioned it due to the mention of Paget's disease of bone and [facility]  We will take a look at these records to see if any further investigation is warranted.   4) Health maintenance exam: Reviewed age and gender appropriate health maintenance issues (prudent diet, regular exercise, health risks of tobacco and excessive alcohol, use of seatbelts, fire alarms in home, use of sunscreen).  Also reviewed age and gender appropriate health screening as well as vaccine recommendations. Vaccines: Prevnar 20->given today.  Tdap-->deferred for now.  Flu->given today.  Shingrix->deferred today. Labs: fasting HP Cervical ca screening: per GYN->she is due. [facility]. Colon ca screening: hx colon ca + resection, last colonoscopy 02/2020 with 1 adenoma, rpt approx 02/2022."  INTERIM HX: [place] feels well No home blood pressure monitoring.  History of colon cancer -->reviewed most recent hematology/oncology follow-up 12/18/2021--> no sign of cancer recurrence.  C-Met completely normal except serum creatinine 1.22, stable.  CEA 1.2.  CBC normal. She is due for surveillance colonoscopy this month. Imaging showed stability of her pulmonary nodules, pancreatic cysts, and renal cysts.  Lately has had problems with eczema in the right ear canal. Has seen a dermatologist and then most recently and ear nose and throat  MD. States that Elocon 0.1% cream as needed and as needed Cortisporin otic drops were recommended by the ENT and this has been helpful.  Past Medical History:  Diagnosis Date   Allergy    Full allergy blood testing panel (environmental + foods) NORMAL 06/2018 (Dr. BVerlin Fester.   Anemia    Arthritis    rt, shoulder   Asymptomatic cholelithiasis 2019/2020   Atypical chest pain    cardiac w/u normal   Chronic renal insufficiency, stage 55 (moderate) (HCC)    Stage II/III (GFR 50s-60s).  No proteinuria.  ARB added 12/2010 by nephrologist for renal protection.  Cr 1.1, GFR 55 on labs with onc 07/15/17.   [place]   (METASTATIC TO LIVER 2018)--Initial dx 09/2014-Low anterior resection of rectum-laparoscopic, + chemo X about 8 cycles with dose reductions for toxicity.  2018--CEA continued to rise so PET scan done; liver lesion showed up. Partial hepatectomy 12/2016.  f/u imaging 07/16/17--no signn of recurrence.  Prominence of left adenexa noted so onc ordered pelvic u/s.   Gout    Uric acid level decreased from 9 to 4.1 with addition of uloric 40 mg qd (02/2011).   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  Stable on CT w/contrast 07/16/17, 05/2018, and 09/08/2018.   Postmenopausal    Premature ventricular contractions (PVCs) (VPCs) 2021   stress test normal, echo normal.  Rhythm monitoring ordered but not scheduled as of 08/2020.   Right shoulder pain 2017   Dr. Mardelle Matte: pt  did well with injection and PT.  Adhesive capsulitis vs cuff pathology: another injection by Dr. Mardelle Matte 05/20/16 helped x 2 wks.  MRI is next step: pt is considering this.   Seborrheic dermatitis    Face; consider contact derm (around eyes); Lyndle Herrlich 09/2012--desonide 0.05% cream rx'd   Solitary pulmonary nodule 2016/17   Stable on f/u CT imaging as of 10/2015.  Stable/unchanged 4 mm nodule RML on CT chest w/contrast 07/16/17.   Systolic murmur 17/7939   echo normal    Past Surgical History:  Procedure Laterality Date   [place]   BREAST REDUCTION SURGERY  2003   bilateral   CARDIOVASCULAR STRESS TEST  05/31/2020   myoc perf img NORMAL/LOW RISK   CESAREAN SECTION  1998   COLON SURGERY  09/2014   [facility]   COLONOSCOPY N/A 08/17/2014   Procedure: COLONOSCOPY;  Surgeon: Ladene Artist, MD;  Location: [facility];  Service: Endoscopy;  Laterality: N/A;   COLONOSCOPY  01/2018   Polypectomy--tubular adenoma with NO high grade dysplasia.  Repeat 1-2 yrs for surveillance.   COLONOSCOPY W/ POLYPECTOMY  11/12/2016   Multiple polyps,some adenomatous but no high grade dysplasia: recall 1 yr for surveillance.   LAPAROSCOPIC PARTIAL HEPATECTOMY  12/11/2016   liver mass excision     LIVER SURGERY     low anter resect rectum  09/12/14   for colon cancer; [facility], [name]   LUMBAR LAMINECTOMY  2004   microdiscectomy   LUMBAR LAMINECTOMY/DECOMPRESSION MICRODISCECTOMY Left 05/21/2013   Procedure: LUMBAR LAMINECTOMY MICRODISCECTOMY L5-S1 LEFT ;  Surgeon: Tobi Bastos, MD;  Location: [facility];  Service: Orthopedics;  Laterality: Left;   NEPHRECTOMY  1964   Wilms tumor (left)   POLYPECTOMY     TRANSTHORACIC ECHOCARDIOGRAM  06/2020   Normal except grd II DD    Outpatient Medications Prior to Visit  Medication Sig Dispense Refill   cetirizine (ZYRTEC) 10 MG chewable tablet Chew 10 mg by mouth as needed for allergies.      colchicine 0.6 MG tablet Take 1 tablet (0.6 mg total) by mouth 2  (two) times daily as needed. for gout flare 60 tablet 2   febuxostat (ULORIC) 40 MG tablet TAKE 1 TABLET(40 MG) BY MOUTH EVERY MORNING 30 tablet 11   valACYclovir (VALTREX) 1000 MG tablet 2 tabs po q12h x 2 doses 4 tablet 6   irbesartan (AVAPRO) 150 MG tablet TAKE 1 TABLET(150 MG) BY MOUTH DAILY 90 tablet 1   acetaminophen (TYLENOL) 500 MG tablet Take 1,000 mg by mouth every 6 (six) hours as needed for pain. (Patient not taking: Reported on 02/28/2022)     calcium carbonate (TUMS - DOSED IN MG ELEMENTAL CALCIUM) 500 MG chewable tablet Chew 1 tablet by mouth daily as needed for indigestion or heartburn.  (Patient not taking: Reported on 02/28/2022)     mometasone (ELOCON) 0.1 % cream Apply topically 2 (two) times daily as needed. (Patient not taking: Reported on 02/28/2022)     neomycin-polymyxin-hydrocortisone (CORTISPORIN) 3.5-10000-1 OTIC suspension Use 4 drops in the affected ear twice daily for up  to 5 days as needed for itching (Patient not taking: Reported on 02/28/2022)     EPINEPHrine (AUVI-Q) 0.3 mg/0.3 mL IJ SOAJ injection Use as directed for severe allergic reaction (Patient not taking: Reported on 05/29/2021) 2 Device 2   fluocinonide (LIDEX) 0.05 % external solution Apply topically. (Patient not taking: Reported on 02/28/2022)     No facility-administered medications prior to visit.    Allergies  Allergen Reactions   Aldomet [Methyldopa]     Severe bone suppression   Latex Rash    Blisters, rash   Banana Itching   Benzthiazide Other (See Comments)    HCTZ increased gout flares   Kiwi Extract Other (See Comments)    Scratchy throat and itchy ear   Other Other (See Comments)    Scratchy throat and itchy ear Nectarine- itchy throat Kiwi by allergy test  dermabond- vascular reaction   Peach [Prunus Persica] Itching   Plum Pulp Itching   Thiazide-Type Diuretics Other (See Comments)    HCTZ increased gout flares   Crab [Shellfish Allergy] Rash   Tape Rash    ROS As per  HPI  PE:    02/28/2022    2:37 PM 05/29/2021    8:06 AM 03/08/2021    4:12 PM  Vitals with BMI  Height 5' 5"  5' 5"    Weight 191 lbs 190 lbs 3 oz   BMI 89.37 34.28   Systolic 768 115 726  Diastolic 89 81 90  Pulse 75 73 78     Physical Exam  Gen: Alert, well appearing.  Patient is oriented to person, place, time, and situation. AFFECT: pleasant, lucid thought and speech. EARS: External auditory canals with normal epithelium, no cerumen or crusting.  TMs normal. CV: RRR, no m/r/g.   LUNGS: CTA bilat, nonlabored resps, good aeration in all lung fields. EXT: no clubbing or cyanosis.  no edema.    LABS:  Last CBC Lab Results  Component Value Date   WBC 5.5 05/29/2021   HGB 13.2 05/29/2021   HCT 39.5 05/29/2021   MCV 93.3 05/29/2021   MCH 29.9 05/20/2013   RDW 13.2 05/29/2021   PLT 189.0 20/35/5974   Last metabolic panel Lab Results  Component Value Date   GLUCOSE 105 (H) 05/29/2021   NA 138 05/29/2021   K 5.0 05/29/2021   CL 105 05/29/2021   CO2 23 05/29/2021   BUN 25 (H) 05/29/2021   CREATININE 1.22 (H) 05/29/2021   GFRNONAA 53 (L) 05/20/2013   CALCIUM 10.3 05/29/2021   PHOS 3.6 08/01/2014   PROT 6.8 05/29/2021   ALBUMIN 4.2 05/29/2021   BILITOT 0.6 05/29/2021   ALKPHOS 88 05/29/2021   AST 19 05/29/2021   ALT 17 05/29/2021   Last lipids Lab Results  Component Value Date   CHOL 176 05/29/2021   HDL 58.80 05/29/2021   LDLCALC 98 05/29/2021   TRIG 98.0 05/29/2021   CHOLHDL 3 05/29/2021   Last thyroid functions Lab Results  Component Value Date   TSH 3.14 05/29/2021   IMPRESSION AND PLAN:  #1 hypertension, well controlled on irbesartan 150 mg a day. Rx 90 days with 3 refills today. Labs 12/18/2021 with her oncologist showed serum creatinine stable at 1.22, all other labs normal.  #2 chronic renal insufficiency stage III -->solitary kidney, remote history of nephrectomy. She avoids NSAIDs.  She hydrates well. Recent serum creatinine 12/18/2021 stable  at 1.22.  #3 gout, essentially without any flares since being on Uloric.   #4 recurrent herpes labialis.  None today. She has found the combination of Valtrex pills and acyclovir ointment to be the most helpful.  Refilled these today.  #5 history of metastatic colon cancer in 2018. She is 5 years cancer free.   Next oncology follow-up will be May 2024. She is due for her surveillance colonoscopy this year and will contact her gastroenterology office if she does not get a recall letter in the next couple of months.  #6 eczema of external auditory canal. So far doing well on Elocon cream as needed.  She has Cortisporin otic drops to use if a suppurative exudate begins.  An After Visit Summary was printed and given to the patient.  FOLLOW UP: Return for 6-12 mo f/u HTN.  Signed:  Crissie Sickles, MD           02/28/2022

## 2022-03-19 ENCOUNTER — Encounter: Payer: Self-pay | Admitting: Gastroenterology

## 2022-04-17 ENCOUNTER — Ambulatory Visit (AMBULATORY_SURGERY_CENTER): Payer: Self-pay | Admitting: *Deleted

## 2022-04-17 VITALS — Ht 65.0 in | Wt 191.0 lb

## 2022-04-17 DIAGNOSIS — Z800 Family history of malignant neoplasm of digestive organs: Secondary | ICD-10-CM

## 2022-04-17 DIAGNOSIS — Z85038 Personal history of other malignant neoplasm of large intestine: Secondary | ICD-10-CM

## 2022-04-17 DIAGNOSIS — Z86010 Personal history of colonic polyps: Secondary | ICD-10-CM

## 2022-04-17 MED ORDER — ONDANSETRON HCL 4 MG PO TABS: ORAL_TABLET | ORAL | 0 refills | Status: DC

## 2022-04-17 MED ORDER — NA SULFATE-K SULFATE-MG SULF 17.5-3.13-1.6 GM/177ML PO SOLN: 1.0000 | Freq: Once | ORAL | 0 refills | Status: AC

## 2022-04-17 NOTE — Progress Notes (Signed)
No egg or soy allergy known to patient  No issues known to pt with past sedation with any surgeries or procedures Patient denies ever being told they had issues or difficulty with intubation  No FH of Malignant Hyperthermia Pt is not on diet pills Pt is not on  home 02  Pt is not on blood thinners  Pt denies issues with constipation  No A fib or A flutter Have any cardiac testing pending--NO Pt instructed to use [employer] or GoodRx for a price reduction on prep     Pt. Concerned about nausea Zofran given to take 30-60 minutes prior to each prep dose.

## 2022-04-30 ENCOUNTER — Encounter: Payer: Self-pay | Admitting: Gastroenterology

## 2022-05-16 ENCOUNTER — Ambulatory Visit (AMBULATORY_SURGERY_CENTER): Payer: BC Managed Care – PPO | Admitting: Gastroenterology

## 2022-05-16 ENCOUNTER — Encounter: Payer: Self-pay | Admitting: Gastroenterology

## 2022-05-16 VITALS — BP 119/63 | HR 66 | Temp 97.8°F | Resp 14 | Ht 65.0 in | Wt 191.0 lb

## 2022-05-16 DIAGNOSIS — D125 Benign neoplasm of sigmoid colon: Secondary | ICD-10-CM

## 2022-05-16 DIAGNOSIS — D120 Benign neoplasm of cecum: Secondary | ICD-10-CM | POA: Diagnosis not present

## 2022-05-16 DIAGNOSIS — Z85038 Personal history of other malignant neoplasm of large intestine: Secondary | ICD-10-CM | POA: Diagnosis not present

## 2022-05-16 DIAGNOSIS — Z86010 Personal history of colonic polyps: Secondary | ICD-10-CM

## 2022-05-16 DIAGNOSIS — Z08 Encounter for follow-up examination after completed treatment for malignant neoplasm: Secondary | ICD-10-CM

## 2022-05-16 DIAGNOSIS — D124 Benign neoplasm of descending colon: Secondary | ICD-10-CM

## 2022-05-16 DIAGNOSIS — D123 Benign neoplasm of transverse colon: Secondary | ICD-10-CM

## 2022-05-16 DIAGNOSIS — Z1211 Encounter for screening for malignant neoplasm of colon: Secondary | ICD-10-CM | POA: Diagnosis not present

## 2022-05-16 DIAGNOSIS — Z800 Family history of malignant neoplasm of digestive organs: Secondary | ICD-10-CM

## 2022-05-16 HISTORY — PX: COLONOSCOPY: SHX174

## 2022-05-16 MED ORDER — SODIUM CHLORIDE 0.9 % IV SOLN: 500.0000 mL | INTRAVENOUS | Status: DC

## 2022-05-16 NOTE — Op Note (Signed)
[facility] Patient Name: Elaine Jenkins Procedure Date: 05/16/2022 9:05 AM MRN: [id number] Endoscopist: Ladene Artist , MD Age: 61 Referring MD:  Date of Birth: Oct 05, 1960 Gender: Female Account #: [id number] Procedure:                Colonoscopy Indications:              High risk colon cancer surveillance: Personal                            history of colon cancer Medicines:                Monitored Anesthesia Care Procedure:                Pre-Anesthesia Assessment:                           - Prior to the procedure, a History and Physical                            was performed, and patient medications and                            allergies were reviewed. The patient's tolerance of                            previous anesthesia was also reviewed. The risks                            and benefits of the procedure and the sedation                            options and risks were discussed with the patient.                            All questions were answered, and informed consent                            was obtained. Prior Anticoagulants: The patient has                            taken no previous anticoagulant or antiplatelet                            agents. ASA Grade Assessment: II - A patient with                            mild systemic disease. After reviewing the risks                            and benefits, the patient was deemed in                            satisfactory condition to undergo the procedure.  After obtaining informed consent, the colonoscope                            was passed under direct vision. Throughout the                            procedure, the patient's blood pressure, pulse, and                            oxygen saturations were monitored continuously. The                            CF [id number] #[id number] was introduced through the anus                            and advanced to the the cecum,  identified by                            appendiceal orifice and ileocecal valve. The                            ileocecal valve, appendiceal orifice, and rectum                            were photographed. The quality of the bowel                            preparation was good. The colonoscopy was performed                            without difficulty. The patient tolerated the                            procedure well. Scope In: 9:12:33 AM Scope Out: 9:23:13 AM Scope Withdrawal Time: 0 hours 9 minutes 27 seconds  Total Procedure Duration: 0 hours 10 minutes 40 seconds  Findings:                 The perianal and digital rectal examinations were                            normal.                           Five sessile polyps were found in the sigmoid colon                            (2), descending colon (1), transverse colon (1) and                            ileocecal valve (1). The polyps were 6 to 10 mm in                            size. These polyps were removed with a cold snare.  Resection and retrieval were complete.                           A single small localized angioectasia without                            bleeding was found in the transverse colon.                           There was evidence of a prior end-to-side                            colo-colonic anastomosis in the distal sigmoid                            colon. This was patent and was characterized by                            healthy appearing mucosa. The anastomosis was                            traversed.                           The exam was otherwise without abnormality on                            direct and retroflexion views. Complications:            No immediate complications. Estimated blood loss:                            None. Estimated Blood Loss:     Estimated blood loss: none. Impression:               - Five 6 to 10 mm polyps in the sigmoid colon, in                             the descending colon, in the transverse colon and                            at the ileocecal valve, removed with a cold snare.                            Resected and retrieved.                           - A single non-bleeding colonic angioectasia.                           - Patent end-to-side colo-colonic anastomosis,                            characterized by healthy appearing mucosa.                           -  The examination was otherwise normal on direct                            and retroflexion views. Recommendation:           - Repeat colonoscopy, likely 2-3 years, after                            studies are complete for surveillance based on                            pathology results.                           - Patient has a contact number available for                            emergencies. The signs and symptoms of potential                            delayed complications were discussed with the                            patient. Return to normal activities tomorrow.                            Written discharge instructions were provided to the                            patient.                           - Resume previous diet.                           - Continue present medications.                           - Await pathology results.                           - No aspirin, ibuprofen, naproxen, or other                            non-steroidal anti-inflammatory drugs for 2 weeks                            after polyp removal. Ladene Artist, MD 05/16/2022 9:28:01 AM This report has been signed electronically.

## 2022-05-16 NOTE — Progress Notes (Signed)
Pt's states no medical or surgical changes since previsit or office visit. VS assessed by D.T 

## 2022-05-16 NOTE — Progress Notes (Signed)
A and O x3. Report to RN. Tolerated MAC anesthesia well. 

## 2022-05-16 NOTE — Patient Instructions (Signed)
Information on polyps given to you today.  Await pathology results.   Avoid NSAIDS (Aspirin, Ibuprofen, Aleve, Naproxen), you may use Tylenol as needed for 2 weeks after polyp removal.  Resume previous diet and medications.   YOU HAD AN ENDOSCOPIC PROCEDURE TODAY AT [facility]:   Refer to the procedure report that was given to you for any specific questions about what was found during the examination.  If the procedure report does not answer your questions, please call your gastroenterologist to clarify.  If you requested that your care partner not be given the details of your procedure findings, then the procedure report has been included in a sealed envelope for you to review at your convenience later.  YOU SHOULD EXPECT: Some feelings of bloating in the abdomen. Passage of more gas than usual.  Walking can help get rid of the air that was put into your GI tract during the procedure and reduce the bloating. If you had a lower endoscopy (such as a colonoscopy or flexible sigmoidoscopy) you may notice spotting of blood in your stool or on the toilet paper. If you underwent a bowel prep for your procedure, you may not have a normal bowel movement for a few days.  Please Note:  You might notice some irritation and congestion in your nose or some drainage.  This is from the oxygen used during your procedure.  There is no need for concern and it should clear up in a day or so.  SYMPTOMS TO REPORT IMMEDIATELY:  Following lower endoscopy (colonoscopy or flexible sigmoidoscopy):  Excessive amounts of blood in the stool  Significant tenderness or worsening of abdominal pains  Swelling of the abdomen that is new, acute  Fever of 100F or higher  For urgent or emergent issues, a gastroenterologist can be reached at any hour by calling [phone]. Do not use MyChart messaging for urgent concerns.    DIET:  We do recommend a small meal at first, but then you may proceed to your  regular diet.  Drink plenty of fluids but you should avoid alcoholic beverages for 24 hours.  ACTIVITY:  You should plan to take it easy for the rest of today and you should NOT DRIVE or use heavy machinery until tomorrow (because of the sedation medicines used during the test).    FOLLOW UP: Our staff will call the number listed on your records the next business day following your procedure.  We will call around 7:15- 8:00 am to check on you and address any questions or concerns that you may have regarding the information given to you following your procedure. If we do not reach you, we will leave a message.     If any biopsies were taken you will be contacted by phone or by letter within the next 1-3 weeks.  Please call us at [phone] if you have not heard about the biopsies in 3 weeks.    SIGNATURES/CONFIDENTIALITY: You and/or your care partner have signed paperwork which will be entered into your electronic medical record.  These signatures attest to the fact that that the information above on your After Visit Summary has been reviewed and is understood.  Full responsibility of the confidentiality of this discharge information lies with you and/or your care-partner.

## 2022-05-16 NOTE — Progress Notes (Signed)
Called to room to assist during endoscopic procedure.  Patient ID and intended procedure confirmed with present staff. Received instructions for my participation in the procedure from the performing physician.  

## 2022-05-16 NOTE — Progress Notes (Signed)
History & Physical  Primary Care Physician:  Tammi Sou, MD Primary Gastroenterologist: Lucio Edward, MD  CHIEF COMPLAINT:  Personal history of colon cancer  HPI: Elaine Jenkins is a 61 y.o. female with a personal history of colon cancer for surveillance colonoscopy.   Past Medical History:  Diagnosis Date   Allergy    Full allergy blood testing panel (environmental + foods) NORMAL 06/2018 (Dr. Verlin Fester).   Anemia    Arthritis    rt, shoulder   Asymptomatic cholelithiasis 2019/2020   Atypical chest pain    cardiac w/u normal   Chronic renal insufficiency, stage 3 (moderate) (HCC)    Stage II/III (GFR 50s-60s).  No proteinuria.  ARB added 12/2010 by nephrologist for renal protection.  Cr 1.1, GFR 55 on labs with onc 07/15/17.   Colon cancer ([place]) 08/2014   (METASTATIC TO LIVER 2018)--Initial dx 09/2014-Low anterior resection of rectum-laparoscopic, + chemo X about 8 cycles with dose reductions for toxicity.  2018--CEA continued to rise so PET scan done; liver lesion showed up. Partial hepatectomy 12/2016.  f/u imaging 07/16/17--no signn of recurrence.  Prominence of left adenexa noted so onc ordered pelvic u/s.   GERD (gastroesophageal reflux disease)    "A LITTEL,TAKE TUMS"   Gout    Uric acid level decreased from 9 to 4.1 with addition of uloric 40 mg qd (02/2011).   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  Also hx of PIH.   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  Stable on CT  w/contrast 07/16/17, 05/2018, and 09/08/2018.   Postmenopausal    Premature ventricular contractions (PVCs) (VPCs) 2021   stress test normal, echo normal.  Rhythm monitoring ordered but not scheduled as of 08/2020.   Right shoulder pain 2017   Dr. Mardelle Matte: pt did well with injection and PT.  Adhesive capsulitis vs cuff pathology: another injection by Dr. Mardelle Matte 05/20/16 helped x 2 wks.  MRI is next step: pt is considering this.   Seborrheic dermatitis    Face; consider contact derm (around eyes); Lyndle Herrlich 09/2012--desonide 0.05% cream rx'd   Solitary pulmonary nodule 2016/17   Stable on f/u CT imaging as of 10/2015.  Stable/unchanged 4 mm nodule RML on CT chest w/contrast 07/16/17.   Systolic murmur 63/0160   echo normal    Past Surgical History:  Procedure Laterality Date   [place]   BREAST REDUCTION SURGERY  2003   bilateral   CARDIOVASCULAR STRESS TEST  05/31/2020   myoc perf img NORMAL/LOW RISK   CESAREAN SECTION  1998   COLON SURGERY  09/2014   [facility]   COLONOSCOPY N/A 08/17/2014   Procedure: COLONOSCOPY;  Surgeon: Ladene Artist, MD;  Location: [facility];  Service: Endoscopy;  Laterality: N/A;   COLONOSCOPY  01/2018   Polypectomy--tubular adenoma with NO high grade dysplasia.  Repeat 1-2 yrs for surveillance.   COLONOSCOPY W/ POLYPECTOMY  11/12/2016   Multiple polyps,some adenomatous but  no high grade dysplasia: recall 1 yr for surveillance.   LAPAROSCOPIC PARTIAL HEPATECTOMY  12/11/2016   liver mass excision     LIVER SURGERY     low anter resect rectum  09/12/14   for colon cancer; [facility], [name]   LUMBAR LAMINECTOMY  2004   microdiscectomy   LUMBAR LAMINECTOMY/DECOMPRESSION MICRODISCECTOMY Left 05/21/2013   Procedure: LUMBAR LAMINECTOMY MICRODISCECTOMY L5-S1 LEFT ;  Surgeon: Tobi Bastos, MD;  Location: [facility];  Service: Orthopedics;  Laterality: Left;   NEPHRECTOMY  1964   Wilms tumor (left)   POLYPECTOMY     TRANSTHORACIC ECHOCARDIOGRAM  06/2020    Normal except grd II DD    Prior to Admission medications   Medication Sig Start Date End Date Taking? Authorizing Provider  acetaminophen (TYLENOL) 500 MG tablet Take 1,000 mg by mouth every 6 (six) hours as needed for pain.   Yes [provider]  calcium carbonate (TUMS - DOSED IN MG ELEMENTAL CALCIUM) 500 MG chewable tablet Chew 1 tablet by mouth daily as needed for indigestion or heartburn.   Yes [provider]  febuxostat (ULORIC) 40 MG tablet TAKE 1 TABLET(40 MG) BY MOUTH EVERY MORNING 01/04/22  Yes McGowen, Adrian Blackwater, MD  irbesartan (AVAPRO) 150 MG tablet TAKE 1 TABLET(150 MG) BY MOUTH DAILY 02/28/22  Yes McGowen, Adrian Blackwater, MD  mometasone (ELOCON) 0.1 % cream Apply topically 2 (two) times daily as needed. 02/20/22  Yes [provider]  neomycin-polymyxin-hydrocortisone (CORTISPORIN) 3.5-10000-1 OTIC suspension  02/20/22  Yes [provider]  acyclovir ointment (ZOVIRAX) 5 % Apply 1 Application topically every 3 (three) hours. Patient not taking: Reported on 05/16/2022 02/28/22   Tammi Sou, MD  cetirizine (ZYRTEC) 10 MG chewable tablet Chew 10 mg by mouth as needed for allergies.  Patient not taking: Reported on 05/16/2022    [provider]  colchicine 0.6 MG tablet Take 1 tablet (0.6 mg total) by mouth 2 (two) times daily as needed. for gout flare Patient not taking: Reported on 04/17/2022 10/09/16   Tammi Sou, MD  EPINEPHrine (AUVI-Q) 0.3 mg/0.3 mL IJ SOAJ injection Use as directed for severe allergic reaction Patient not taking: Reported on 04/17/2022 02/28/22   Tammi Sou, MD  ondansetron (ZOFRAN) 4 MG tablet TAKE 30-60 MINUTES PRIOR TO [facility] PREP DOSE Patient not taking: Reported on 05/16/2022 04/17/22   Ladene Artist, MD  valACYclovir (VALTREX) 1000 MG tablet 2 tabs po q12h x 2 doses Patient not taking: Reported on 05/16/2022 01/30/22   Tammi Sou, MD    Current Outpatient Medications  Medication Sig Dispense Refill    acetaminophen (TYLENOL) 500 MG tablet Take 1,000 mg by mouth every 6 (six) hours as needed for pain.     calcium carbonate (TUMS - DOSED IN MG ELEMENTAL CALCIUM) 500 MG chewable tablet Chew 1 tablet by mouth daily as needed for indigestion or heartburn.     febuxostat (ULORIC) 40 MG tablet TAKE 1 TABLET(40 MG) BY MOUTH EVERY MORNING 30 tablet 11   irbesartan (AVAPRO) 150 MG tablet TAKE 1 TABLET(150 MG) BY MOUTH DAILY 90 tablet 3   mometasone (ELOCON) 0.1 % cream Apply topically 2 (two) times daily as needed.     neomycin-polymyxin-hydrocortisone (CORTISPORIN) 3.5-10000-1 OTIC suspension      acyclovir ointment (ZOVIRAX) 5 % Apply 1 Application topically every 3 (three) hours. (Patient not taking: Reported on 05/16/2022) 5 g 3   cetirizine (ZYRTEC) 10 MG chewable tablet Chew 10 mg by mouth as  needed for allergies.  (Patient not taking: Reported on 05/16/2022)     colchicine 0.6 MG tablet Take 1 tablet (0.6 mg total) by mouth 2 (two) times daily as needed. for gout flare (Patient not taking: Reported on 04/17/2022) 60 tablet 2   EPINEPHrine (AUVI-Q) 0.3 mg/0.3 mL IJ SOAJ injection Use as directed for severe allergic reaction (Patient not taking: Reported on 04/17/2022) 2 each 2   ondansetron (ZOFRAN) 4 MG tablet TAKE 30-60 MINUTES PRIOR TO EACH PREP DOSE (Patient not taking: Reported on 05/16/2022) 2 tablet 0   valACYclovir (VALTREX) 1000 MG tablet 2 tabs po q12h x 2 doses (Patient not taking: Reported on 05/16/2022) 4 tablet 6   Current Facility-Administered Medications  Medication Dose Route Frequency Provider Last Rate Last Admin   0.9 %  sodium chloride infusion  500 mL Intravenous Continuous Ladene Artist, MD        Allergies as of 05/16/2022 - Review Complete 05/16/2022  Allergen Reaction Noted   Aldomet [methyldopa]  07/24/2012   Latex Rash 07/24/2012   Banana Itching 05/20/2013   Benzthiazide Other (See Comments) 08/24/2012   Kiwi extract Other (See Comments) 09/08/2014   Other Other (See  Comments) 09/08/2014   Peach [prunus persica] Itching 05/20/2013   Plum pulp Itching 12/02/2016   Thiazide-type diuretics Other (See Comments) 08/24/2012   Crab [shellfish allergy] Rash 05/20/2013   Tape Rash 10/27/2015    Family History  Problem Relation Age of Onset   Hypertension Mother    Heart disease Mother    COPD Mother    Hypertension Father    Heart disease Father    Alzheimer's disease Father    Crohn's disease Father    Colon cancer Father        diagnosed 69's   Colon polyps Sister    Colon polyps Brother    Esophageal cancer Neg Hx    Rectal cancer Neg Hx    Stomach cancer Neg Hx    Ulcerative colitis Neg Hx     Social History   Socioeconomic History   Marital status: Married    Spouse name: Not on file   Number of children: Not on file   Years of education: Not on file   Highest education level: Not on file  Occupational History   Not on file  Tobacco Use   Smoking status: Former    Types: Cigarettes    Passive exposure: Past ([place])   Smokeless tobacco: Never   Tobacco comments:    light in college  Vaping Use   Vaping Use: Never used  Substance and Sexual Activity   Alcohol use: Yes    Alcohol/week: 1.0 standard drink of alcohol    Types: 1 Cans of beer per week    Comment: holidays and special occasions; 3-4 times yearly   Drug use: No    Frequency: 1.0 times per week   Sexual activity: Yes  Other Topics Concern   Not on file  Social History Narrative   Married, one child.   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   Relocated to [place] from [place], [place] 2013.    No Tobacco.  Rare alcohol.  No drug use.    Social Determinants of Health   Financial Resource Strain: Not on file  Food Insecurity: Not on file  Transportation Needs: Not on file  Physical Activity: Not on file  Stress: Not on file  Social Connections: Not on file  Intimate Partner Violence: Not on file  Review of Systems:  All systems reviewed were  negative except where noted in HPI.   Physical Exam: General:  Alert, well-developed, in NAD Head:  Normocephalic and atraumatic. Eyes:  Sclera clear, no icterus.   Conjunctiva pink. Ears:  Normal auditory acuity. Mouth:  No deformity or lesions.  Neck:  Supple; no masses . Lungs:  Clear throughout to auscultation.   No wheezes, crackles, or rhonchi. No acute distress. Heart:  Regular rate and rhythm; no murmurs. Abdomen:  Soft, nondistended, nontender. No masses, hepatomegaly. No obvious masses.  Normal bowel .    Rectal:  Deferred   Msk:  Symmetrical without gross deformities.. Pulses:  Normal pulses noted. Extremities:  Without edema. Neurologic:  Alert and  oriented x4;  grossly normal neurologically. Skin:  Intact without significant lesions or rashes. Cervical Nodes:  No significant cervical adenopathy. Psych:  Alert and cooperative. Normal mood and affect.   Impression / Plan:   Personal history of colon cancer for surveillance colonoscopy.  Pricilla Riffle. Fuller Plan  05/16/2022, 8:57 AM See Shea Evans, [facility], to contact our on call provider

## 2022-05-17 ENCOUNTER — Telehealth: Payer: Self-pay

## 2022-05-17 NOTE — Telephone Encounter (Signed)
Left message on answering machine. 

## 2022-05-20 ENCOUNTER — Ambulatory Visit: Payer: BC Managed Care – PPO

## 2022-05-20 ENCOUNTER — Ambulatory Visit (INDEPENDENT_AMBULATORY_CARE_PROVIDER_SITE_OTHER): Payer: BC Managed Care – PPO

## 2022-05-20 DIAGNOSIS — Z23 Encounter for immunization: Secondary | ICD-10-CM

## 2022-06-04 ENCOUNTER — Encounter: Payer: Self-pay | Admitting: Gastroenterology

## 2022-07-29 DIAGNOSIS — Z1231 Encounter for screening mammogram for malignant neoplasm of breast: Secondary | ICD-10-CM | POA: Diagnosis not present

## 2022-07-29 LAB — HM MAMMOGRAPHY

## 2022-08-08 DIAGNOSIS — H353131 Nonexudative age-related macular degeneration, bilateral, early dry stage: Secondary | ICD-10-CM | POA: Diagnosis not present

## 2022-08-28 DIAGNOSIS — S0502XA Injury of conjunctiva and corneal abrasion without foreign body, left eye, initial encounter: Secondary | ICD-10-CM | POA: Diagnosis not present

## 2022-11-25 ENCOUNTER — Other Ambulatory Visit: Payer: Self-pay | Admitting: Family Medicine

## 2023-01-17 ENCOUNTER — Ambulatory Visit: Payer: BC Managed Care – PPO | Admitting: Family Medicine

## 2023-01-27 ENCOUNTER — Ambulatory Visit: Payer: BC Managed Care – PPO | Admitting: Family Medicine

## 2023-02-03 ENCOUNTER — Ambulatory Visit: Payer: BC Managed Care – PPO | Admitting: Family Medicine

## 2023-02-03 ENCOUNTER — Encounter: Payer: Self-pay | Admitting: Family Medicine

## 2023-02-03 VITALS — BP 156/86 | HR 64 | Temp 98.3°F | Ht 65.0 in | Wt 189.2 lb

## 2023-02-03 DIAGNOSIS — N1831 Chronic kidney disease, stage 3a: Secondary | ICD-10-CM

## 2023-02-03 DIAGNOSIS — I10 Essential (primary) hypertension: Secondary | ICD-10-CM | POA: Diagnosis not present

## 2023-02-03 DIAGNOSIS — Z0000 Encounter for general adult medical examination without abnormal findings: Secondary | ICD-10-CM

## 2023-02-03 DIAGNOSIS — B001 Herpesviral vesicular dermatitis: Secondary | ICD-10-CM | POA: Diagnosis not present

## 2023-02-03 DIAGNOSIS — Z23 Encounter for immunization: Secondary | ICD-10-CM

## 2023-02-03 DIAGNOSIS — M109 Gout, unspecified: Secondary | ICD-10-CM

## 2023-02-03 LAB — CBC
HCT: 38.8 % (ref 36.0–46.0)
Hemoglobin: 12.6 g/dL (ref 12.0–15.0)
MCHC: 32.6 g/dL (ref 30.0–36.0)
MCV: 94.1 fl (ref 78.0–100.0)
Platelets: 198 10*3/uL (ref 150.0–400.0)
RBC: 4.12 Mil/uL (ref 3.87–5.11)
RDW: 14 % (ref 11.5–15.5)
WBC: 6.7 10*3/uL (ref 4.0–10.5)

## 2023-02-03 LAB — COMPREHENSIVE METABOLIC PANEL
ALT: 16 U/L (ref 0–35)
AST: 19 U/L (ref 0–37)
Albumin: 4.1 g/dL (ref 3.5–5.2)
Alkaline Phosphatase: 80 U/L (ref 39–117)
BUN: 21 mg/dL (ref 6–23)
CO2: 25 mEq/L (ref 19–32)
Calcium: 10 mg/dL (ref 8.4–10.5)
Chloride: 108 mEq/L (ref 96–112)
Creatinine, Ser: 1.16 mg/dL (ref 0.40–1.20)
GFR: 50.49 mL/min — ABNORMAL LOW (ref >60.00)
Glucose, Bld: 106 mg/dL — ABNORMAL HIGH (ref 70–99)
Potassium: 4.5 mEq/L (ref 3.5–5.1)
Sodium: 139 mEq/L (ref 135–145)
Total Bilirubin: 0.8 mg/dL (ref 0.2–1.2)
Total Protein: 6.7 g/dL (ref 6.0–8.3)

## 2023-02-03 LAB — LIPID PANEL
Cholesterol: 167 mg/dL (ref 0–200)
HDL: 51.9 mg/dL (ref >39.00)
LDL Cholesterol: 95 mg/dL (ref 0–99)
NonHDL: 115.01
Total CHOL/HDL Ratio: 3
Triglycerides: 101 mg/dL (ref 0.0–149.0)
VLDL: 20.2 mg/dL (ref 0.0–40.0)

## 2023-02-03 MED ORDER — IRBESARTAN 150 MG PO TABS: ORAL_TABLET | ORAL | 3 refills | Status: DC

## 2023-02-03 MED ORDER — VALACYCLOVIR HCL 1 G PO TABS: ORAL_TABLET | ORAL | 6 refills | Status: DC

## 2023-02-03 MED ORDER — ACYCLOVIR 5 % EX OINT: 1.0000 | TOPICAL_OINTMENT | CUTANEOUS | 3 refills | Status: DC

## 2023-02-03 MED ORDER — FEBUXOSTAT 40 MG PO TABS: ORAL_TABLET | ORAL | 3 refills | Status: DC

## 2023-02-03 NOTE — Progress Notes (Signed)
Office Note 02/03/2023  CC:  Chief Complaint  Patient presents with   Medical Management of Chronic Issues    Pt is fasting    HPI:  Patient is a 62 y.o. female who is here for annual health maintenance exam and follow-up hypertension and chronic renal insufficiency stage II/III.  She feels great.  She walks several miles each day on her treadmill. Blood pressures at dental visits and other MD visits in the past have been normal. She has had quite a busy morning, stressful weekend.  Past Medical History:  Diagnosis Date   Allergy    Full allergy blood testing panel (environmental + foods) NORMAL 06/2018 (Dr. Nunzio Cobbs).   Anemia    Arthritis    rt, shoulder   Asymptomatic cholelithiasis 2019/2020   Atypical chest pain    cardiac w/u normal   Chronic renal insufficiency, stage 3 (moderate) (HCC)    Stage II/III (GFR 50s-60s).  No proteinuria.  ARB added 12/2010 by nephrologist for renal protection.  Cr 1.1, GFR 55 on labs with onc 07/15/17.   Colon cancer (HCC) 08/2014   (METASTATIC TO LIVER 2018)--Initial dx 09/2014-Low anterior resection of rectum-laparoscopic, + chemo X about 8 cycles with dose reductions for toxicity.  2018--CEA continued to rise so PET scan done; liver lesion showed up. Partial hepatectomy 12/2016.  f/u imaging 07/16/17--no signn of recurrence.  Prominence of left adenexa noted so onc ordered pelvic u/s.   GERD (gastroesophageal reflux disease)    "A LITTEL,TAKE TUMS"   Gout    Uric acid level decreased from 9 to 4.1 with addition of uloric 40 mg qd (02/2011).   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  Also hx of PIH.   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  Stable on CT w/contrast 07/16/17, 05/2018, and 09/08/2018.   Postmenopausal    Premature ventricular contractions (PVCs) (VPCs) 2021   stress test normal, echo normal.  Rhythm monitoring ordered but not scheduled as of 08/2020.   Right shoulder pain 2017   Dr. Dion Saucier: pt did well with injection and PT.  Adhesive capsulitis vs cuff pathology: another injection by Dr. Dion Saucier 05/20/16 helped x 2 wks.  MRI is next step: pt is considering this.   Seborrheic dermatitis    Face; consider contact derm (around eyes); Roxan Hockey 09/2012--desonide 0.05% cream rx'd   Solitary pulmonary nodule 2016/17   Stable on f/u CT imaging as of 10/2015.  Stable/unchanged 4 mm nodule RML on CT chest w/contrast 07/16/17.   Systolic murmur 06/2020   echo normal    Past Surgical History:  Procedure Laterality Date   APPENDECTOMY  1964   BREAST REDUCTION SURGERY  2003   bilateral   CARDIOVASCULAR STRESS TEST  05/31/2020   myoc perf img NORMAL/LOW RISK   CESAREAN SECTION  1998   COLON SURGERY  09/2014   [facility]   COLONOSCOPY N/A 08/17/2014   Procedure: COLONOSCOPY;  Surgeon: Meryl Dare, MD;  Location: [facility];  Service: Endoscopy;  Laterality: N/A;   COLONOSCOPY  01/2018   Polypectomy--tubular adenoma with NO high grade dysplasia.  Repeat 1-2 yrs for surveillance.   COLONOSCOPY  05/16/2022   multiple polyps-->recall 60yrs   COLONOSCOPY W/ POLYPECTOMY  11/12/2016   Multiple polyps,some adenomatous but no high grade dysplasia: recall 1 yr for surveillance.   LAPAROSCOPIC PARTIAL HEPATECTOMY  12/11/2016   liver mass excision     LIVER SURGERY     low anter resect rectum  09/12/2014   for colon cancer; [facility], Dr. Emogene Morgan   LUMBAR LAMINECTOMY  2004   microdiscectomy   LUMBAR LAMINECTOMY/DECOMPRESSION MICRODISCECTOMY  Left 05/21/2013   Procedure: LUMBAR LAMINECTOMY MICRODISCECTOMY L5-S1 LEFT ;  Surgeon: Jacki Cones, MD;  Location: [facility];  Service: Orthopedics;  Laterality: Left;   NEPHRECTOMY  1964   Wilms tumor (left)   POLYPECTOMY     TRANSTHORACIC ECHOCARDIOGRAM  06/2020   Normal except grd II DD    Family History  Problem Relation Age of Onset   Hypertension Mother    Heart disease Mother    COPD Mother    Hypertension Father    Heart disease Father    Alzheimer's disease Father    Crohn's disease Father    Colon cancer Father        diagnosed 64's   Colon polyps Sister    Colon polyps Brother    Esophageal cancer Neg Hx    Rectal cancer Neg Hx    Stomach cancer Neg Hx    Ulcerative colitis Neg Hx     Social History   Socioeconomic History   Marital status: Married    Spouse name: Not on file   Number of children: Not on file   Years of education: Not on file   Highest education level: Not on file  Occupational History   Not on file  Tobacco Use   Smoking status: Former    Types: Cigarettes    Passive exposure: Past (MOTHER SMOKER)   Smokeless tobacco: Never   Tobacco comments:    light in college  Vaping Use   Vaping Use: Never used  Substance and Sexual Activity   Alcohol use: Yes    Alcohol/week: 1.0 standard drink of alcohol    Types: 1 Cans of beer per week    Comment: holidays and special occasions; 3-4 times yearly   Drug use: No    Frequency: 1.0 times per week   Sexual activity: Yes  Other Topics Concern   Not on file  Social History Narrative   Married, one child.   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   Relocated to [place] from [place], [place] 2013.    No Tobacco.  Rare alcohol.  No drug use.    Social Determinants of Health   Financial Resource Strain: Not on file  Food Insecurity: Not on file  Transportation Needs: Not on file  Physical Activity: Not on file  Stress: Not on file  Social Connections: Not on file  Intimate Partner  Violence: Not on file    Outpatient Medications Prior to Visit  Medication Sig Dispense Refill   acetaminophen (TYLENOL) 500 MG tablet Take 1,000 mg by mouth every 6 (six) hours as needed for pain.     calcium carbonate (TUMS - DOSED IN MG ELEMENTAL CALCIUM) 500 MG chewable tablet Chew 1 tablet by mouth daily as needed for indigestion or heartburn.     colchicine 0.6 MG tablet Take 1  tablet (0.6 mg total) by mouth 2 (two) times daily as needed. for gout flare (Patient not taking: Reported on 04/17/2022) 60 tablet 2   EPINEPHrine (AUVI-Q) 0.3 mg/0.3 mL IJ SOAJ injection Use as directed for severe allergic reaction (Patient not taking: Reported on 04/17/2022) 2 each 2   mometasone (ELOCON) 0.1 % cream Apply topically 2 (two) times daily as needed.     neomycin-polymyxin-hydrocortisone (CORTISPORIN) 3.5-10000-1 OTIC suspension      acyclovir ointment (ZOVIRAX) 5 % Apply 1 Application topically every 3 (three) hours. (Patient not taking: Reported on 05/16/2022) 5 g 3   cetirizine (ZYRTEC) 10 MG chewable tablet Chew 10 mg by mouth as needed for allergies.  (Patient not taking: Reported on 05/16/2022)     febuxostat (ULORIC) 40 MG tablet TAKE ONE TABLET BY MOUTH EVERY MORNING. DUE FOR APPT JULY 30 tablet 2   irbesartan (AVAPRO) 150 MG tablet TAKE 1 TABLET(150 MG) BY MOUTH DAILY 90 tablet 3   ondansetron (ZOFRAN) 4 MG tablet TAKE 30-60 MINUTES PRIOR TO EACH PREP DOSE (Patient not taking: Reported on 05/16/2022) 2 tablet 0   valACYclovir (VALTREX) 1000 MG tablet 2 tabs po q12h x 2 doses (Patient not taking: Reported on 05/16/2022) 4 tablet 6   No facility-administered medications prior to visit.    Allergies  Allergen Reactions   Aldomet [Methyldopa]     Severe bone suppression   Latex Rash    Blisters, rash   Banana Itching   Benzthiazide Other (See Comments)    HCTZ increased gout flares   Kiwi Extract Other (See Comments)    Scratchy throat and itchy ear   Other Other (See Comments)    Scratchy  throat and itchy ear Nectarine- itchy throat Kiwi by allergy test  dermabond- vascular reaction   Peach [Prunus Persica] Itching   Plum Pulp Itching   Thiazide-Type Diuretics Other (See Comments)    HCTZ increased gout flares   Crab [Shellfish Allergy] Rash   Tape Rash    Review of Systems  Constitutional:  Negative for appetite change, chills, fatigue and fever.  HENT:  Negative for congestion, dental problem, ear pain and sore throat.   Eyes:  Negative for discharge, redness and visual disturbance.  Respiratory:  Negative for cough, chest tightness, shortness of breath and wheezing.   Cardiovascular:  Negative for chest pain, palpitations and leg swelling.  Gastrointestinal:  Negative for abdominal pain, blood in stool, diarrhea, nausea and vomiting.  Genitourinary:  Negative for difficulty urinating, dysuria, flank pain, frequency, hematuria and urgency.  Musculoskeletal:  Negative for arthralgias, back pain, joint swelling, myalgias and neck stiffness.  Skin:  Negative for pallor and rash.  Neurological:  Negative for dizziness, speech difficulty, weakness and headaches.  Hematological:  Negative for adenopathy. Does not bruise/bleed easily.  Psychiatric/Behavioral:  Negative for confusion and sleep disturbance. The patient is not nervous/anxious.     PE;    02/03/2023    9:45 AM 05/16/2022    9:46 AM 05/16/2022    9:36 AM  Vitals with BMI  Height 5\' 5"     Weight 189 lbs 3 oz    BMI 31.48    Systolic 156 119 725  Diastolic 86 63 61  Pulse 64 66 72   EXAM chaperoned by female CMA Gen: Alert, well appearing.  Patient is oriented to person, place, time, and situation. AFFECT: pleasant, lucid thought and speech. ENT: Ears: EACs clear, normal epithelium.  TMs with good light reflex and landmarks bilaterally.  Eyes: no  injection, icteris, swelling, or exudate.  EOMI, PERRLA. Nose: no drainage or turbinate edema/swelling.  No injection or focal lesion.  Mouth: lips without  lesion/swelling.  Oral mucosa pink and moist.  Dentition intact and without obvious caries or gingival swelling.  Oropharynx without erythema, exudate, or swelling.  Neck: supple/nontender.  No LAD, mass, or TM.  Carotid pulses 2+ bilaterally, without bruits. CV: RRR, no m/r/g.   LUNGS: CTA bilat, nonlabored resps, good aeration in all lung fields. ABD: soft, NT, ND, BS normal.  No hepatospenomegaly or mass.  No bruits. EXT: no clubbing, cyanosis, or edema.  Musculoskeletal: no joint swelling, erythema, warmth, or tenderness.  ROM of all joints intact. Skin - no sores or suspicious lesions or rashes or color changes  Pertinent labs:  Lab Results  Component Value Date   TSH 3.14 05/29/2021   Lab Results  Component Value Date   WBC 5.1 12/18/2021   HGB 12.4 12/18/2021   HCT 37 12/18/2021   MCV 93.3 05/29/2021   PLT 171 12/18/2021   Lab Results  Component Value Date   CREATININE 1.2 (A) 12/18/2021   BUN 24 (A) 12/18/2021   NA 136 (A) 12/18/2021   K 4.1 12/18/2021   CL 105 12/18/2021   CO2 25 (A) 12/18/2021   Lab Results  Component Value Date   ALT 19 12/18/2021   AST 19 12/18/2021   ALKPHOS 82 12/18/2021   BILITOT 0.6 05/29/2021   Lab Results  Component Value Date   CHOL 176 05/29/2021   Lab Results  Component Value Date   HDL 58.80 05/29/2021   Lab Results  Component Value Date   LDLCALC 98 05/29/2021   Lab Results  Component Value Date   TRIG 98.0 05/29/2021   Lab Results  Component Value Date   CHOLHDL 3 05/29/2021   ASSESSMENT AND PLAN:   #1 health maintenance exam: Reviewed age and gender appropriate health maintenance issues (prudent diet, regular exercise, health risks of tobacco and excessive alcohol, use of seatbelts, fire alarms in home, use of sunscreen).  Also reviewed age and gender appropriate health screening as well as vaccine recommendations. Vaccines: Tdap-->today.  Shingrix->deferred today but she's making nurse appt for #1. Labs: fasting  HP Cervical ca screening: per GYN. Breast ca screening: UTD via [facility] 07/30/22. Colon ca screening: hx colon ca + resection, last colonoscopy 05/16/22 with multiple polyps, repeat colonoscopy in 2 to 3 years was recommended.  #2 hypertension, well-controlled on irbesartan 150 mg a day.  #3 gout.  Well-controlled on Uloric 40 mg a day.  She has not had need for colchicine in several years.  4 recurrent herpes labialis.  None today. She has found the combination of Valtrex pills and acyclovir ointment to be the most helpful.  Refilled these today.   #5 history of metastatic colon cancer in 2018. She is 6 years cancer free.   Next surveillance imaging planned for July this year.  An After Visit Summary was printed and given to the patient.  FOLLOW UP:  Return in about 1 year (around 02/03/2024) for annual CPE (fasting).  Signed:  Santiago Bumpers, MD           02/03/2023

## 2023-02-03 NOTE — Addendum Note (Signed)
Addended by: Emi Holes D on: 02/03/2023 11:02 AM   Modules accepted: Orders

## 2023-02-03 NOTE — Patient Instructions (Signed)

## 2023-02-04 ENCOUNTER — Other Ambulatory Visit (INDEPENDENT_AMBULATORY_CARE_PROVIDER_SITE_OTHER): Payer: BC Managed Care – PPO

## 2023-02-04 DIAGNOSIS — R7301 Impaired fasting glucose: Secondary | ICD-10-CM

## 2023-02-04 LAB — HEMOGLOBIN A1C: Hgb A1c MFr Bld: 5.8 % (ref 4.6–6.5)

## 2023-02-05 ENCOUNTER — Encounter: Payer: Self-pay | Admitting: Family Medicine

## 2023-02-18 ENCOUNTER — Ambulatory Visit: Payer: BC Managed Care – PPO

## 2023-02-20 ENCOUNTER — Other Ambulatory Visit: Payer: Self-pay | Admitting: Family Medicine

## 2023-03-04 DIAGNOSIS — N2889 Other specified disorders of kidney and ureter: Secondary | ICD-10-CM | POA: Diagnosis not present

## 2023-03-04 DIAGNOSIS — C19 Malignant neoplasm of rectosigmoid junction: Secondary | ICD-10-CM | POA: Diagnosis not present

## 2023-03-04 DIAGNOSIS — R918 Other nonspecific abnormal finding of lung field: Secondary | ICD-10-CM | POA: Diagnosis not present

## 2023-03-04 DIAGNOSIS — C787 Secondary malignant neoplasm of liver and intrahepatic bile duct: Secondary | ICD-10-CM | POA: Diagnosis not present

## 2023-03-04 DIAGNOSIS — N289 Disorder of kidney and ureter, unspecified: Secondary | ICD-10-CM | POA: Diagnosis not present

## 2023-03-04 DIAGNOSIS — E279 Disorder of adrenal gland, unspecified: Secondary | ICD-10-CM | POA: Diagnosis not present

## 2023-03-04 DIAGNOSIS — C189 Malignant neoplasm of colon, unspecified: Secondary | ICD-10-CM | POA: Diagnosis not present

## 2023-03-04 DIAGNOSIS — K862 Cyst of pancreas: Secondary | ICD-10-CM | POA: Diagnosis not present

## 2023-03-21 DIAGNOSIS — H0288B Meibomian gland dysfunction left eye, upper and lower eyelids: Secondary | ICD-10-CM | POA: Diagnosis not present

## 2023-03-21 DIAGNOSIS — H52223 Regular astigmatism, bilateral: Secondary | ICD-10-CM | POA: Diagnosis not present

## 2023-03-21 DIAGNOSIS — H353131 Nonexudative age-related macular degeneration, bilateral, early dry stage: Secondary | ICD-10-CM | POA: Diagnosis not present

## 2023-03-21 DIAGNOSIS — H0288A Meibomian gland dysfunction right eye, upper and lower eyelids: Secondary | ICD-10-CM | POA: Diagnosis not present

## 2023-03-21 DIAGNOSIS — H2513 Age-related nuclear cataract, bilateral: Secondary | ICD-10-CM | POA: Diagnosis not present

## 2023-03-21 DIAGNOSIS — H524 Presbyopia: Secondary | ICD-10-CM | POA: Diagnosis not present

## 2023-04-16 DIAGNOSIS — L308 Other specified dermatitis: Secondary | ICD-10-CM | POA: Diagnosis not present

## 2023-04-16 DIAGNOSIS — L309 Dermatitis, unspecified: Secondary | ICD-10-CM | POA: Diagnosis not present

## 2023-06-20 ENCOUNTER — Ambulatory Visit (INDEPENDENT_AMBULATORY_CARE_PROVIDER_SITE_OTHER): Payer: BC Managed Care – PPO

## 2023-06-20 DIAGNOSIS — Z23 Encounter for immunization: Secondary | ICD-10-CM | POA: Diagnosis not present

## 2023-06-20 NOTE — Progress Notes (Signed)
Pt presents for flu vaccine, given IM left deltoid. Pt tolerated well.

## 2023-07-28 DIAGNOSIS — L245 Irritant contact dermatitis due to other chemical products: Secondary | ICD-10-CM | POA: Diagnosis not present

## 2023-07-28 DIAGNOSIS — L723 Sebaceous cyst: Secondary | ICD-10-CM | POA: Diagnosis not present

## 2023-07-28 DIAGNOSIS — L218 Other seborrheic dermatitis: Secondary | ICD-10-CM | POA: Diagnosis not present

## 2023-07-28 DIAGNOSIS — L821 Other seborrheic keratosis: Secondary | ICD-10-CM | POA: Diagnosis not present

## 2023-08-04 ENCOUNTER — Encounter: Payer: Self-pay | Admitting: Family Medicine

## 2023-08-04 ENCOUNTER — Ambulatory Visit: Payer: BC Managed Care – PPO | Admitting: Family Medicine

## 2023-08-04 VITALS — BP 131/83 | HR 73 | Wt 188.0 lb

## 2023-08-04 DIAGNOSIS — Z9889 Other specified postprocedural states: Secondary | ICD-10-CM

## 2023-08-04 DIAGNOSIS — M47816 Spondylosis without myelopathy or radiculopathy, lumbar region: Secondary | ICD-10-CM | POA: Diagnosis not present

## 2023-08-04 DIAGNOSIS — M5442 Lumbago with sciatica, left side: Secondary | ICD-10-CM

## 2023-08-04 DIAGNOSIS — Z23 Encounter for immunization: Secondary | ICD-10-CM

## 2023-08-04 MED ORDER — PREDNISONE 10 MG PO TABS: ORAL_TABLET | ORAL | 0 refills | Status: DC

## 2023-08-04 MED ORDER — EPINEPHRINE 0.3 MG/0.3ML IJ SOAJ: INTRAMUSCULAR | 2 refills | Status: AC

## 2023-08-04 NOTE — Progress Notes (Signed)
OFFICE VISIT  08/04/2023  CC:  Chief Complaint  Patient presents with   Back Pain    Lower back and hip, L side. Pain radiates; pt states it is mostly difficult to sit and walk. Pt has taken Motrin and Tylenol and has found little relief.     Patient is a 62 y.o. female who presents for back pain.  HPI: Elaine Jenkins has had low back pain with radiation down the left leg for about 5 weeks. No preceding trauma or overuse.  Lately has been more severe.  She feels the pain mostly in the left buttock and the left lower leg and foot, lateral aspect.  The pain is essentially gone when she sits and rests but returns pretty quickly when standing and when ambulating. Stooping forward in posture alleviates things a bit.  She feels a bit of numbness and tingling in the left lower leg as well.  No leg weakness.  No saddle anesthesia or change in bowel/bladder control.  She does have a history of lumbar laminectomy with microdiscectomy in 2004 and 2014. Her second back surgery was much more difficult, she had a prolonged recovery.  Past Medical History:  Diagnosis Date   Allergy    Full allergy blood testing panel (environmental + foods) NORMAL 06/2018 (Dr. Nunzio Cobbs).   Anemia    Arthritis    rt, shoulder   Asymptomatic cholelithiasis 2019/2020   Atypical chest pain    cardiac w/u normal   Chronic renal insufficiency, stage 3 (moderate) (HCC)    Stage II/III (GFR 50s-60s).  No proteinuria.  ARB added 12/2010 by nephrologist for renal protection.  Cr 1.1, GFR 55 on labs with onc 07/15/17.   Colon cancer (HCC) 08/2014   (METASTATIC TO LIVER 2018)--Initial dx 09/2014-Low anterior resection of rectum-laparoscopic, + chemo X about 8 cycles with dose reductions for toxicity.  2018--CEA continued to rise so PET scan done; liver lesion showed up. Partial hepatectomy 12/2016.  f/u imaging 07/16/17--no signn of recurrence.  Prominence of left adenexa noted so onc ordered pelvic u/s.   GERD (gastroesophageal reflux  disease)    "A LITTEL,TAKE TUMS"   Gout    Uric acid level decreased from 9 to 4.1 with addition of uloric 40 mg qd (02/2011).   History of anemia    History of blood transfusion 1963   History of low back pain    History of nephrectomy, unilateral 1964   Wilms tumor at age 79 (right kidney)   History of subacute thyroiditis    HTN (hypertension)    since age 86.  Also hx of PIH.   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  Stable on CT w/contrast 07/16/17, 05/2018, and 09/08/2018.   Postmenopausal    Prediabetes    June 2024 fasting gluc 106 and Hba1c 5.8%   Premature ventricular contractions (PVCs) (VPCs) 2021   stress test normal, echo normal.  Rhythm monitoring ordered but not scheduled as of 08/2020.   Right shoulder pain 2017   Dr. Dion Saucier: pt did well with injection and PT.  Adhesive capsulitis vs cuff pathology: another injection by Dr. Dion Saucier 05/20/16 helped x 2 wks.  MRI is next step: pt is considering this.   Seborrheic dermatitis  Face; consider contact derm (around eyes); Roxan Hockey 09/2012--desonide 0.05% cream rx'd   Solitary pulmonary nodule 2016/17   Stable on f/u CT imaging as of 10/2015.  Stable/unchanged 4 mm nodule RML on CT chest w/contrast 07/16/17.   Systolic murmur 06/2020   echo normal    Past Surgical History:  Procedure Laterality Date   APPENDECTOMY  1964   BREAST REDUCTION SURGERY  2003   bilateral   CARDIOVASCULAR STRESS TEST  05/31/2020   myoc perf img NORMAL/LOW RISK   CESAREAN SECTION  1998   [facility]ERY  09/2014   [facility]   COLONOSCOPY N/A 08/17/2014   Procedure: COLONOSCOPY;  Surgeon: Meryl Dare, MD;  Location: [facility];  Service:  Endoscopy;  Laterality: N/A;   COLONOSCOPY  01/2018   Polypectomy--tubular adenoma with NO high grade dysplasia.  Repeat 1-2 yrs for surveillance.   COLONOSCOPY  05/16/2022   multiple polyps-->recall 24yrs   COLONOSCOPY W/ POLYPECTOMY  11/12/2016   Multiple polyps,some adenomatous but no high grade dysplasia: recall 1 yr for surveillance.   LAPAROSCOPIC PARTIAL HEPATECTOMY  12/11/2016   liver mass excision     LIVER SURGERY     low anter resect rectum  09/12/2014   for colon cancer; [facility], Dr. Emogene Morgan   LUMBAR LAMINECTOMY  2004   microdiscectomy   LUMBAR LAMINECTOMY/DECOMPRESSION MICRODISCECTOMY Left 05/21/2013   Procedure: LUMBAR LAMINECTOMY MICRODISCECTOMY L5-S1 LEFT ;  Surgeon: Jacki Cones, MD;  Location: [facility];  Service: Orthopedics;  Laterality: Left;   NEPHRECTOMY  1964   Wilms tumor (left)   POLYPECTOMY     TRANSTHORACIC ECHOCARDIOGRAM  06/2020   Normal except grd II DD    Outpatient Medications Prior to Visit  Medication Sig Dispense Refill   acetaminophen (TYLENOL) 500 MG tablet Take 1,000 mg by mouth every 6 (six) hours as needed for pain.     acyclovir ointment (ZOVIRAX) 5 % Apply 1 Application topically every 3 (three) hours. 5 g 3   calcium carbonate (TUMS - DOSED IN MG ELEMENTAL CALCIUM) 500 MG chewable tablet Chew 1 tablet by mouth daily as needed for indigestion or heartburn.     febuxostat (ULORIC) 40 MG tablet TAKE ONE TABLET BY MOUTH EVERY MORNING. DUE FOR APPT JULY 90 tablet 3   irbesartan (AVAPRO) 150 MG tablet TAKE 1 TABLET(150 MG) BY MOUTH DAILY 90 tablet 3   mometasone (ELOCON) 0.1 % cream Apply topically 2 (two) times daily as needed.     neomycin-polymyxin-hydrocortisone (CORTISPORIN) 3.5-10000-1 OTIC suspension      valACYclovir (VALTREX) 1000 MG tablet 2 tabs po q12h x 2 doses 4 tablet 6   colchicine 0.6 MG tablet Take 1 tablet (0.6 mg total) by mouth 2 (two) times daily as needed. for gout flare (Patient not taking: Reported on 08/04/2023) 60 tablet 2    EPINEPHrine (AUVI-Q) 0.3 mg/0.3 mL IJ SOAJ injection Use as directed for severe allergic reaction (Patient not taking: Reported on 08/04/2023) 2 each 2   No facility-administered medications prior to visit.    Allergies  Allergen Reactions   Aldomet [Methyldopa]     Severe bone suppression   Latex Rash    Blisters, rash   Banana Itching   Benzthiazide Other (See Comments)    HCTZ increased gout flares   Kiwi Extract Other (See Comments)    Scratchy throat and itchy ear   Other Other (See Comments)    Scratchy throat and itchy ear Nectarine- itchy throat Kiwi by allergy test  dermabond- vascular  reaction   Peach [Prunus Persica] Itching   Plum Pulp Itching   Thiazide-Type Diuretics Other (See Comments)    HCTZ increased gout flares   Crab [Shellfish Allergy] Rash   Tape Rash    Review of Systems  As per HPI  PE:    08/04/2023    8:38 AM 02/03/2023    9:45 AM 05/16/2022    9:46 AM  Vitals with BMI  Height  5\' 5"    Weight 188 lbs 189 lbs 3 oz   BMI  31.48   Systolic 131 156 409  Diastolic 83 86 63  Pulse 73 64 66     Physical Exam  Gen: Alert, well appearing.  Patient is oriented to person, place, time, and situation. AFFECT: pleasant, lucid thought and speech. Low back nontender.  Range of motion intact. Supine straight leg raise elicits some hamstring and glute tightness discomfort but no radiating pain. After the exam when she stood up she began to feel the left leg pain. Lower extremity strength 5 out of 5 proximally distally bilaterally.   LABS:  Last metabolic panel Lab Results  Component Value Date   GLUCOSE 106 (H) 02/03/2023   NA 139 02/03/2023   K 4.5 02/03/2023   CL 108 02/03/2023   CO2 25 02/03/2023   BUN 21 02/03/2023   CREATININE 1.16 02/03/2023   GFR 50.49 (L) 02/03/2023   CALCIUM 10.0 02/03/2023   PHOS 3.6 08/01/2014   PROT 6.7 02/03/2023   ALBUMIN 4.1 02/03/2023   BILITOT 0.8 02/03/2023   ALKPHOS 80 02/03/2023   AST 19  02/03/2023   ALT 16 02/03/2023   IMPRESSION AND PLAN:  Acute left-sided low back pain with left sciatica, L5/S1 level. Will get physical therapy started. Prednisone 40 x 2 days, 30 x 2 days, 20 x 2 days, and 10 x 2 days. She will continue Tylenol 1000 mg 1-2 times a day. We will hold off on radiographs today. Low threshold for getting her back in with orthopedics, although she is very fearful of any back surgery--> [facility], specify Dr. Shelle Iron.  An After Visit Summary was printed and given to the patient.  FOLLOW UP: Return in about 6 weeks (around 09/15/2023) for f/u back pain.  Signed:  Santiago Bumpers, MD           08/04/2023

## 2023-08-04 NOTE — Addendum Note (Signed)
Addended by: Jeoffrey Massed on: 08/04/2023 10:17 AM   Modules accepted: Orders

## 2023-08-08 ENCOUNTER — Telehealth: Payer: Self-pay

## 2023-08-08 NOTE — Telephone Encounter (Signed)
Elaine Jenkins (Patient)   Subject  Elaine Jenkins (Patient)   Topic  Clinical - Medication Question    Communication  Reason for CRM: patient would like to know if clinical team could call to pharmacy to verify that correct brand of epipen was EPINEPHrine (AUVI-Q) 0.3 mg/0.3 mL IJ SOAJ injection.She stated that her husband went to pick up rx but he left it at the pharmacy because he didn't see "AUVI-Q" ON IT.  Spoke with husband to give information.  Husband states she is wanting the name brand.  Spoke with pharmacy and they have to order for pt.  LVM for husband to call office back

## 2023-08-14 ENCOUNTER — Telehealth: Payer: Self-pay

## 2023-08-14 MED ORDER — PREDNISONE 10 MG PO TABS: ORAL_TABLET | ORAL | 0 refills | Status: DC

## 2023-08-14 NOTE — Telephone Encounter (Signed)
 Okay, prednisone prescription sent

## 2023-08-14 NOTE — Telephone Encounter (Signed)
 Copied from CRM [id number]. Topic: Clinical - Medication Question >> Aug 14, 2023  9:41 AM [name] wrote: Reason for CRM: Patient saw Dr Candise on 12/23 for back pain and a week of steroids --predniSONE  (DELTASONE ) were prescribed. Patient states that it worked successfully but now in pain again after out of rx. States that physical therapy doesn't start until 1/9 and she doesn't want to stay in pain until then. Wants to know if Dr. Candise would be willing to send in another round of steroids or what he advises between now and 1/9. Callback [phone]

## 2023-08-21 ENCOUNTER — Ambulatory Visit: Payer: BC Managed Care – PPO | Attending: Family Medicine

## 2023-08-21 ENCOUNTER — Other Ambulatory Visit: Payer: Self-pay

## 2023-08-21 DIAGNOSIS — M5442 Lumbago with sciatica, left side: Secondary | ICD-10-CM | POA: Insufficient documentation

## 2023-08-21 DIAGNOSIS — Z9889 Other specified postprocedural states: Secondary | ICD-10-CM | POA: Insufficient documentation

## 2023-08-21 DIAGNOSIS — R262 Difficulty in walking, not elsewhere classified: Secondary | ICD-10-CM | POA: Insufficient documentation

## 2023-08-21 DIAGNOSIS — R293 Abnormal posture: Secondary | ICD-10-CM | POA: Insufficient documentation

## 2023-08-21 DIAGNOSIS — M47816 Spondylosis without myelopathy or radiculopathy, lumbar region: Secondary | ICD-10-CM | POA: Diagnosis not present

## 2023-08-21 NOTE — Therapy (Addendum)
 OUTPATIENT PHYSICAL THERAPY THORACOLUMBAR EVALUATION   Patient Name: Elaine Jenkins MRN: [id number] DOB:October 05, 1960, 63 y.o., female Today's Date: 08/21/2023  END OF SESSION:  PT End of Session - 08/21/23 1412     Visit Number 1    Number of Visits 7    Date for PT Re-Evaluation 10/10/23    Authorization Type [employer] COMM PPO    PT Start Time 1115    PT Stop Time 1200    PT Time Calculation (min) 45 min    Activity Tolerance Patient tolerated treatment well    Behavior During Therapy [facility] for tasks assessed/performed             Past Medical History:  Diagnosis Date   Allergy    Full allergy blood testing panel (environmental + foods) NORMAL 06/2018 (Dr. Green).   Anemia    Arthritis    rt, shoulder   Asymptomatic cholelithiasis 2019/2020   Atypical chest pain    cardiac w/u normal   Chronic renal insufficiency, stage 3 (moderate) (HCC)    Stage II/III (GFR 50s-60s).  No proteinuria.  ARB added 12/2010 by nephrologist for renal protection.  Cr 1.1, GFR 55 on labs with onc 07/15/17.   Colon cancer (HCC) 08/2014   (METASTATIC TO LIVER 2018)--Initial dx 09/2014-Low anterior resection of rectum-laparoscopic, + chemo X about 8 cycles with dose reductions for toxicity.  2018--CEA continued to rise so PET scan done; liver lesion showed up. Partial hepatectomy 12/2016.  f/u imaging 07/16/17--no signn of recurrence.  Prominence of left adenexa noted so onc ordered pelvic u/s.   GERD (gastroesophageal reflux disease)    A LITTEL,TAKE TUMS   Gout    Uric acid level decreased from 9 to 4.1 with addition of uloric  40 mg qd (02/2011).   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  Also hx of PIH.   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  Stable on CT w/contrast 07/16/17, 05/2018, and 09/08/2018.   Postmenopausal    Prediabetes    June 2024 fasting gluc 106 and Hba1c 5.8%   Premature ventricular contractions (PVCs) (VPCs) 2021   stress test normal, echo normal.  Rhythm monitoring ordered but not scheduled as of 08/2020.   Right shoulder pain 2017   Dr. Josefina: pt did well with injection and PT.  Adhesive capsulitis vs cuff pathology: another injection by Dr. Josefina 05/20/16 helped x 2 wks.  MRI is next step: pt is considering this.   Seborrheic dermatitis    Face; consider contact derm (around eyes); [facility] 09/2012--desonide 0.05% cream rx'd   Solitary pulmonary nodule 2016/17   Stable on f/u CT imaging as of 10/2015.  Stable/unchanged 4 mm nodule RML on CT chest w/contrast 07/16/17.   Systolic murmur 06/2020   echo normal   Past Surgical History:  Procedure Laterality Date   APPENDECTOMY  1964   BREAST REDUCTION SURGERY  2003   bilateral   CARDIOVASCULAR STRESS TEST  05/31/2020  myoc perf img NORMAL/LOW RISK   CESAREAN SECTION  1998   COLON SURGERY  09/2014   [facility]   COLONOSCOPY N/A 08/17/2014   Procedure: COLONOSCOPY;  Surgeon: Gwendlyn ONEIDA Buddy, MD;  Location: [facility];  Service: Endoscopy;  Laterality: N/A;   COLONOSCOPY  01/2018   Polypectomy--tubular adenoma with NO high grade dysplasia.  Repeat 1-2 yrs for surveillance.   COLONOSCOPY  05/16/2022   multiple polyps-->recall 45yrs   COLONOSCOPY W/ POLYPECTOMY  11/12/2016   Multiple polyps,some adenomatous but no high grade dysplasia: recall 1 yr for surveillance.   LAPAROSCOPIC PARTIAL HEPATECTOMY  12/11/2016   liver mass excision     LIVER SURGERY     low anter resect rectum  09/12/2014   for colon cancer; [facility],  Dr. Maurie   LUMBAR LAMINECTOMY  2004   microdiscectomy   LUMBAR LAMINECTOMY/DECOMPRESSION MICRODISCECTOMY Left 05/21/2013   Procedure: LUMBAR LAMINECTOMY MICRODISCECTOMY L5-S1 LEFT ;  Surgeon: Tanda DELENA Heading, MD;  Location: [facility];  Service: Orthopedics;  Laterality: Left;   NEPHRECTOMY  1964   Wilms tumor (left)   POLYPECTOMY     TRANSTHORACIC ECHOCARDIOGRAM  06/2020   Normal except grd II DD   Patient Active Problem List   Diagnosis Date Noted   Chronic eczematous otitis externa of both ears 07/30/2021   Allergic reaction/history of food allergy 06/22/2018   Chronic rhinitis 06/22/2018   Conjunctivitis 06/22/2018   Benign neoplasm of sigmoid colon 08/17/2014   Benign neoplasm of rectum 08/17/2014   Benign neoplasm of transverse colon 08/17/2014   Colonic mass 08/17/2014   Hematochezia 08/17/2014   Anemia, iron deficiency 08/17/2014   S/p nephrectomy 08/11/2014   Spinal stenosis, lumbar region, with neurogenic claudication 05/21/2013   Herniated lumbar intervertebral disc 05/21/2013   Lumbar strain 04/29/2013    PCP: Candise Aleene DEL, MD  REFERRING PROVIDER: Candise Aleene DEL, MD   REFERRING DIAG:  [id number] (ICD-10-CM) - Acute left-sided low back pain with left-sided sciatica  Z98.890 (ICD-10-CM) - History of lumbar surgery  M47.816 (ICD-10-CM) - Lumbar spondylosis    Rationale for Evaluation and Treatment: Rehabilitation  THERAPY DIAG:  Left-sided low back pain with left-sided sciatica, unspecified chronicity  Difficulty in walking, not elsewhere classified  ONSET DATE: November 2024  SUBJECTIVE:                                                                                                                                                                                           SUBJECTIVE STATEMENT: In November noticed L hip pinch when walking which moved to L ant/lateral calf pain . Received steriods which are helpful. Wrose in the  Am and gets better during the  day and worsens in the evening.  PERTINENT HISTORY:   Scoliosis- L rib hump, Microdiscectomy 2004,  Lumbar laminectomy 2015, Renal surgery at 63yo impacted her abdominals  PAIN:  Are you having pain? Yes: NPRS scale: 5/10 Pain location: L lateral calf Pain description: ache c a numb quality Aggravating factors: Prolonged standing walking Relieving factors: Sitting, lying down  PRECAUTIONS: None  RED FLAGS: None   WEIGHT BEARING RESTRICTIONS: No  FALLS:  Has patient fallen in last 6 months? No  LIVING ENVIRONMENT: Lives with: lives with their family Lives in: House/apartment  OCCUPATION: not working  PLOF: Independent  PATIENT GOALS: Pain relief  NEXT MD VISIT: To schedule as needed  OBJECTIVE:  Note: Objective measures were completed at Evaluation unless otherwise noted.  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 ; light numbness L ant/lat calf  MUSCLE LENGTH: Hamstrings: Right 60 deg; Left 60 deg Thomas test: Right NT deg; Left NT deg  POSTURE: increased lumbar lordosis and L shoulder shift on pelvis  PALPATION: Not TTP  LUMBAR ROM:   AROM eval  Flexion Min limited, mid sheen, tight low back  Extension Mod limited, stiff  Right lateral flexion Full, no pain  Left lateral flexion Full, no pain  Right rotation Full, no pain  Left rotation Full, no pain   (Blank rows = not tested)  LOWER EXTREMITY ROM:     Grossly WNLs Active  Right eval Left eval  Hip flexion    Hip extension    Hip abduction    Hip adduction    Hip internal rotation    Hip external rotation    Knee flexion    Knee extension    Ankle dorsiflexion    Ankle plantarflexion    Ankle inversion    Ankle eversion     (Blank rows = not tested)  LOWER EXTREMITY MMT:    Weak  core, statis bridge 30 MMT Right eval Left eval  Hip flexion 4 4  Hip extension 4 4  Hip abduction 4 4  Hip adduction    Hip internal rotation    Hip external rotation 4 4  Knee flexion    Knee extension    Ankle dorsiflexion    Ankle plantarflexion    Ankle inversion    Ankle eversion     (Blank rows = not tested)  LUMBAR SPECIAL TESTS:  Straight leg raise test: Negative and Slump test: Negative  FUNCTIONAL TESTS:  NT  GAIT: Distance walked: 200' Assistive device utilized: None Level of assistance: Complete Independence Comments: WNLs  TREATMENT DATE:  [facility] Adult PT Treatment:                                                DATE: 08/21/23 Therapeutic Exercise: Developed, instructed in, and pt completed therex as noted in HEP  Self Care: Recommended sleeping positions  PATIENT EDUCATION:  Education details: Eval findings, POC, HEP, self care  Person educated: Patient Education method: Explanation, Demonstration, Tactile cues, Verbal cues, and Handouts Education comprehension: verbalized understanding, returned demonstration, verbal cues required, and tactile cues required  HOME EXERCISE PROGRAM: Access Code: [facility] URL: [email or web address] Date: 08/21/2023 Prepared by: Dasie Daft  Exercises - Left Standing Lateral Shift Correction at Wall - Hold  - 2-3 x daily - 7 x weekly - 1 sets - 10 reps - 2 hold - Sidelying Quadratus Lumborum Stretch on Table (Mirrored)  - 2-3 x daily - 7 x weekly - 1 sets - 1-2 reps - 30 hold - Hooklying Single Knee to Chest  - 2 x daily - 7 x weekly - 1 sets - 3 reps - 20 hold - Supine Transversus Abdominis Bracing - Hands on Stomach  - 2 x daily - 7 x weekly - 2 sets - 10 reps - 3 hold - Supine Bridge  - 2 x daily - 7 x weekly - 2 sets - 10 reps - 5 hold  ASSESSMENT:  CLINICAL  IMPRESSION: Patient is a 63 y.o. female who was seen today for physical therapy evaluation and treatment for  M54.42 (ICD-10-CM) - Acute left-sided low back pain with left-sided sciatica  Z98.890 (ICD-10-CM) - History of lumbar surgery  M47.816 (ICD-10-CM) - Lumbar spondylosis  Pt presents with abnormal postural changes which appear to be contributing L LE radicular symptoms. Additionally, pt demonstrates weak hips and core. Following completion of therex/HEP, pt reported L ant/lat calf pain had resolved. Pt will benefit from skilled PT 1w6 to address impairments to optimize back function with less pain.     OBJECTIVE IMPAIRMENTS: decreased activity tolerance, difficulty walking, decreased strength, postural dysfunction, and pain.   ACTIVITY LIMITATIONS: carrying, lifting, bending, standing, and locomotion level  PARTICIPATION LIMITATIONS: meal prep, cleaning, laundry, and community activity  PERSONAL FACTORS: Fitness, Past/current experiences, Time since onset of injury/illness/exacerbation, and 1 comorbidity: Hx of multiple back surgeries  are also affecting patient's functional outcome.   REHAB POTENTIAL: Good  CLINICAL DECISION MAKING: Evolving/moderate complexity  EVALUATION COMPLEXITY: Moderate   GOALS:   SHORT TERM GOALS = LTGs  LONG TERM GOALS: Target date: 10/10/23  Pt will be Ind in a final HEP to maintain achieved LOF  Baseline: started Goal status: INITIAL  2.  Pt will report 75% or greater improvement with the ant/lat calf pain with daily activities. Baseline: 5/10 Goal status: INITIAL  3.  Pt will demonstrate improved hip strength per sustaining a bridge for 90sec Baseline: 30 sec Goal status: INITIAL  4.  Pt's FOTO score will improved to the predicted value of 53% as indication of improved function  Baseline: 61% Goal status: INITIAL  PLAN:  PT FREQUENCY: 1x/week  PT DURATION: 6 weeks  PLANNED INTERVENTIONS: 97164- PT Re-evaluation, 97110-Therapeutic  exercises, 97530- Therapeutic activity, 97112- Neuromuscular re-education, 97535- Self Care, [id number]- Manual therapy, 97014- Electrical stimulation (unattended), [id number]- Electrical stimulation (manual), Patient/Family education, Taping, Dry Needling, Joint mobilization, Spinal mobilization, Cryotherapy, and Moist heat.  PLAN FOR NEXT SESSION: Review FOTO; assess response to HEP; body mechanics; progress therex as indicated; use of modalities, manual therapy; and TPDN as indicated.  Jennefer Kopp MS, PT 08/21/23 2:51 PM   For all possible CPT codes, reference the Planned Interventions line above.     Check all conditions that are expected to impact treatment: {Conditions expected to impact treatment:Musculoskeletal disorders   If treatment provided at initial evaluation, no treatment charged due to lack of  authorization.   Alzada Brazee MS, PT 09/04/23 1:22 PM

## 2023-08-25 NOTE — Therapy (Signed)
 OUTPATIENT PHYSICAL THERAPY THORACOLUMBAR EVALUATION   Patient Name: Elaine Jenkins MRN: [id number] DOB:05/10/1961, 63 y.o., female Today's Date: 08/27/2023  END OF SESSION:  PT End of Session - 08/27/23 0811     Visit Number 2    Number of Visits 7    Date for PT Re-Evaluation 10/10/23    Authorization Type [employer] COMM PPO    PT Start Time 0802    PT Stop Time 0844    PT Time Calculation (min) 42 min    Activity Tolerance Patient tolerated treatment well    Behavior During Therapy [facility] for tasks assessed/performed              Past Medical History:  Diagnosis Date   Allergy    Full allergy blood testing panel (environmental + foods) NORMAL 06/2018 (Dr. Arlene Ben).   Anemia    Arthritis    rt, shoulder   Asymptomatic cholelithiasis 2019/2020   Atypical chest pain    cardiac w/u normal   Chronic renal insufficiency, stage 3 (moderate) (HCC)    Stage II/III (GFR 50s-60s).  No proteinuria.  ARB added 12/2010 by nephrologist for renal protection.  Cr 1.1, GFR 55 on labs with onc 07/15/17.   Colon cancer (HCC) 08/2014   (METASTATIC TO LIVER 2018)--Initial dx 09/2014-Low anterior resection of rectum-laparoscopic, + chemo X about 8 cycles with dose reductions for toxicity.  2018--CEA continued to rise so PET scan done; liver lesion showed up. Partial hepatectomy 12/2016.  f/u imaging 07/16/17--no signn of recurrence.  Prominence of left adenexa noted so onc ordered pelvic u/s.   GERD (gastroesophageal reflux disease)    "A LITTEL,TAKE TUMS"   Gout    Uric acid level decreased from 9 to 4.1 with addition of uloric  40 mg qd (02/2011).   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  Also hx of PIH.   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  Stable on CT w/contrast 07/16/17, 05/2018, and 09/08/2018.   Postmenopausal    Prediabetes    June 2024 fasting gluc 106 and Hba1c 5.8%   Premature ventricular contractions (PVCs) (VPCs) 2021   stress test normal, echo normal.  Rhythm monitoring ordered but not scheduled as of 08/2020.   Right shoulder pain 2017   Dr. Agatha Horsfall: pt did well with injection and PT.  Adhesive capsulitis vs cuff pathology: another injection by Dr. Agatha Horsfall 05/20/16 helped x 2 wks.  MRI is next step: pt is considering this.   Seborrheic dermatitis    Face; consider contact derm (around eyes); [facility] 09/2012--desonide 0.05% cream rx'd   Solitary pulmonary nodule 2016/17   Stable on f/u CT imaging as of 10/2015.  Stable/unchanged 4 mm nodule RML on CT chest w/contrast 07/16/17.   Systolic murmur 06/2020   echo normal   Past Surgical History:  Procedure Laterality Date   APPENDECTOMY  1964   BREAST REDUCTION SURGERY  2003   bilateral   CARDIOVASCULAR STRESS TEST  05/31/2020   myoc perf img NORMAL/LOW RISK   CESAREAN SECTION  1998   COLON SURGERY  09/2014   [facility]   COLONOSCOPY N/A 08/17/2014   Procedure: COLONOSCOPY;  Surgeon: Asencion Blacksmith, MD;  Location: [facility];  Service: Endoscopy;  Laterality: N/A;   COLONOSCOPY  01/2018   Polypectomy--tubular adenoma with NO high grade dysplasia.  Repeat 1-2 yrs for surveillance.   COLONOSCOPY  05/16/2022   multiple polyps-->recall 38yrs   COLONOSCOPY W/ POLYPECTOMY  11/12/2016   Multiple polyps,some adenomatous but no high grade dysplasia: recall 1 yr for surveillance.   LAPAROSCOPIC PARTIAL HEPATECTOMY  12/11/2016   liver mass excision     LIVER SURGERY     low anter resect rectum  09/12/2014   for colon cancer; [facility],  Dr. Pinkie Brigham   LUMBAR LAMINECTOMY  2004   microdiscectomy   LUMBAR LAMINECTOMY/DECOMPRESSION MICRODISCECTOMY Left 05/21/2013   Procedure: LUMBAR LAMINECTOMY MICRODISCECTOMY L5-S1 LEFT ;  Surgeon: Florencia Hunter, MD;  Location: [facility];  Service: Orthopedics;  Laterality: Left;   NEPHRECTOMY  1964   Wilms tumor (left)   POLYPECTOMY     TRANSTHORACIC ECHOCARDIOGRAM  06/2020   Normal except grd II DD   Patient Active Problem List   Diagnosis Date Noted   Chronic eczematous otitis externa of both ears 07/30/2021   Allergic reaction/history of food allergy 06/22/2018   Chronic rhinitis 06/22/2018   Conjunctivitis 06/22/2018   Benign neoplasm of sigmoid colon 08/17/2014   Benign neoplasm of rectum 08/17/2014   Benign neoplasm of transverse colon 08/17/2014   Colonic mass 08/17/2014   Hematochezia 08/17/2014   Anemia, iron deficiency 08/17/2014   S/p nephrectomy 08/11/2014   Spinal stenosis, lumbar region, with neurogenic claudication 05/21/2013   Herniated lumbar intervertebral disc 05/21/2013   Lumbar strain 04/29/2013    PCP: Shelvia Dick, MD  REFERRING PROVIDER: Shelvia Dick, MD   REFERRING DIAG:  [phone] (ICD-10-CM) - Acute left-sided low back pain with left-sided sciatica  Z98.890 (ICD-10-CM) - History of lumbar surgery  M47.816 (ICD-10-CM) - Lumbar spondylosis    Rationale for Evaluation and Treatment: Rehabilitation  THERAPY DIAG:  Left-sided low back pain with left-sided sciatica, unspecified chronicity  Abnormal posture  Difficulty in walking, not elsewhere classified  ONSET DATE: November 2024  SUBJECTIVE:                                                                                                                                                                                           SUBJECTIVE STATEMENT: Pt reports she initially was sore with her HEP, but feels like they have been helpful.  EVAL: In November noticed L hip pinch when walking  which moved to L ant/lateral calf pain . Received steriods which are helpful. Wrose in the Am and gets better during the day and worsens in the evening.  PERTINENT HISTORY:   Scoliosis- L rib hump, Microdiscectomy 2004,  Lumbar laminectomy 2015, Renal surgery at 63yo impacted her abdominals  PAIN:  Are you having pain? Yes: NPRS scale: 2/10 Pain location: L lateral calf Pain description: ache c a numb quality Aggravating factors: Prolonged standing walking Relieving factors: Sitting, lying down  PRECAUTIONS: None  RED FLAGS: None   WEIGHT BEARING RESTRICTIONS: No  FALLS:  Has patient fallen in last 6 months? No  LIVING ENVIRONMENT: Lives with: lives with their family Lives in: House/apartment  OCCUPATION: not working  PLOF: Independent  PATIENT GOALS: Pain relief  NEXT MD VISIT: To schedule as needed  OBJECTIVE:  Note: Objective measures were completed at Evaluation unless otherwise noted.  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 ; light numbness L ant/lat calf  MUSCLE LENGTH: Hamstrings: Right 60 deg; Left 60 deg Thomas test: Right NT deg; Left NT deg  POSTURE: increased lumbar lordosis and L shoulder shift on pelvis  PALPATION: Not TTP  LUMBAR ROM:   AROM eval  Flexion Min limited, mid sheen, tight low back  Extension Mod limited, stiff  Right lateral flexion Full, no pain  Left lateral flexion Full, no pain  Right rotation Full, no pain  Left rotation Full, no pain   (Blank rows = not tested)  LOWER EXTREMITY ROM:     Grossly WNLs Active  Right eval Left eval  Hip flexion    Hip extension    Hip abduction    Hip adduction    Hip internal rotation    Hip external rotation    Knee flexion    Knee extension    Ankle dorsiflexion     Ankle plantarflexion    Ankle inversion    Ankle eversion     (Blank rows = not tested)  LOWER EXTREMITY MMT:    Weak core, statis bridge 30" MMT Right eval Left eval  Hip flexion 4 4  Hip extension 4 4  Hip abduction 4 4  Hip adduction    Hip internal rotation    Hip external rotation 4 4  Knee flexion    Knee extension    Ankle dorsiflexion    Ankle plantarflexion    Ankle inversion    Ankle eversion     (Blank rows = not tested)  LUMBAR SPECIAL TESTS:  Straight leg raise test: Negative and Slump test: Negative  FUNCTIONAL TESTS:  NT  GAIT: Distance walked: 200' Assistive device utilized: None Level of assistance: Complete Independence Comments: WNLs  TREATMENT DATE:  [facility] Adult PT Treatment:                                                DATE: 08/27/23 Therapeutic Exercise: NuStep 4 min L5 UE/LE while taking subjective intake Seated trunk flexion c swiss ball forward and laterally Left Standing Lateral Shift Correction at Wall 10 reps - 2 hold Seated Quadratus Lumborum Stretch  3 reps - 30 hold Single Knee to Chest 3 reps - 20 hold Supine Transversus Abdominis Bracing 10 reps - 3 hold Supine marching with core engaged 2x 10 Supine Bridge 15 reps - 5 hold HEP modified and updated  [facility] Adult PT Treatment:                                                DATE: 08/21/23 Therapeutic Exercise: Developed, instructed in, and pt completed therex as noted in HEP  Self Care: Recommended sleeping positions                                                                                                                               PATIENT EDUCATION:  Education details: Eval findings, POC, HEP, self care  Person educated: Patient Education method: Explanation, Demonstration, Tactile cues, Verbal cues, and Handouts Education comprehension: verbalized understanding, returned demonstration, verbal cues required, and tactile cues required  HOME EXERCISE PROGRAM: Access  Code: [facility] URL: [email or web address] Date: 08/27/2023 Prepared by: Liborio Reeds  Exercises - Left Standing Lateral Shift Correction at Wall - Hold  - 2-3 x daily - 7 x weekly - 1 sets - 10 reps - 2 hold - Seated Flexion Stretch with Swiss Ball  - 1 x daily - 7 x weekly - 1 sets - 5 reps - 5-20 hold - Seated Thoracic Flexion and Rotation with Swiss Ball  - 1 x daily - 7 x weekly - 1 sets - 4 reps - 5-20 hold - Seated Quadratus Lumborum Stretch in Chair (Mirrored)  - 1 x daily - 7 x weekly - 1 sets - 3 reps - 20 hold - Supine Single Knee to Chest  - 1 x daily - 7 x weekly - 3 sets - 10 reps - Supine Bridge  - 2 x daily - 7 x weekly - 2 sets - 10 reps - 5 hold - Supine Transversus Abdominis Bracing - Hands on Ground  - 1 x daily - 7 x weekly - 2 sets - 10 reps - 3 hold - Supine March  - 1 x daily - 7 x weekly - 2 sets - 10 reps  ASSESSMENT:  CLINICAL IMPRESSION: PT was completed for lumbopelvic flexibility and strengthening. Pt's initial response to therex/HEP has been positive. Pt has been consistent with her HEP and demonstrates proper understanding of them. HEP was modified for improved affect. Pt tolerated prescribed therex today without adverse effects. Pt will continue to benefit from skilled PT to address impairments for improved function with minimized pain.  EVAL:  a 63 y.o. female who was seen today for physical therapy evaluation and treatment for  M54.42 (ICD-10-CM) - Acute left-sided low back pain with left-sided sciatica  Z98.890 (ICD-10-CM) - History of lumbar surgery  M47.816 (ICD-10-CM) - Lumbar spondylosis  Pt  presents with abnormal postural changes which appear to be contributing L LE radicular symptoms. Additionally, pt demonstrates weak hips and core. Following completion of therex/HEP, pt reported L ant/lat calf pain had resolved. Pt will benefit from skilled PT 1w6 to address impairments to optimize back function with less pain.     OBJECTIVE  IMPAIRMENTS: decreased activity tolerance, difficulty walking, decreased strength, postural dysfunction, and pain.   ACTIVITY LIMITATIONS: carrying, lifting, bending, standing, and locomotion level  PARTICIPATION LIMITATIONS: meal prep, cleaning, laundry, and community activity  PERSONAL FACTORS: Fitness, Past/current experiences, Time since onset of injury/illness/exacerbation, and 1 comorbidity: Hx of multiple back surgeries  are also affecting patient's functional outcome.   REHAB POTENTIAL: Good  CLINICAL DECISION MAKING: Evolving/moderate complexity  EVALUATION COMPLEXITY: Moderate   GOALS:   SHORT TERM GOALS = LTGs  LONG TERM GOALS: Target date: 10/10/23  Pt will be Ind in a final HEP to maintain achieved LOF  Baseline: started Goal status: ONGOING  2.  Pt will report 75% or greater improvement with the ant/lat calf pain with daily activities. Baseline: 5/10 Goal status: INITIAL  3.  Pt will demonstrate improved hip strength per sustaining a bridge for 90sec Baseline: 30 sec Goal status: INITIAL  4.  Pt's FOTO score will improved to the predicted value of 53% as indication of improved function  Baseline: 61% Goal status: INITIAL  PLAN:  PT FREQUENCY: 1x/week  PT DURATION: 6 weeks  PLANNED INTERVENTIONS: 97164- PT Re-evaluation, 97110-Therapeutic exercises, 97530- Therapeutic activity, 97112- Neuromuscular re-education, 97535- Self Care, 16109- Manual therapy, 97014- Electrical stimulation (unattended), [id number]- Electrical stimulation (manual), Patient/Family education, Taping, Dry Needling, Joint mobilization, Spinal mobilization, Cryotherapy, and Moist heat.  PLAN FOR NEXT SESSION: Review FOTO; assess response to HEP; body mechanics; progress therex as indicated; use of modalities, manual therapy; and TPDN as indicated.  Laprecious Austill MS, PT 08/27/23 9:53 AM

## 2023-08-27 ENCOUNTER — Ambulatory Visit: Payer: BC Managed Care – PPO

## 2023-08-27 DIAGNOSIS — R262 Difficulty in walking, not elsewhere classified: Secondary | ICD-10-CM

## 2023-08-27 DIAGNOSIS — R293 Abnormal posture: Secondary | ICD-10-CM

## 2023-08-27 DIAGNOSIS — M5442 Lumbago with sciatica, left side: Secondary | ICD-10-CM

## 2023-08-27 DIAGNOSIS — Z9889 Other specified postprocedural states: Secondary | ICD-10-CM | POA: Diagnosis not present

## 2023-08-27 DIAGNOSIS — M47816 Spondylosis without myelopathy or radiculopathy, lumbar region: Secondary | ICD-10-CM | POA: Diagnosis not present

## 2023-09-03 NOTE — Therapy (Addendum)
 " OUTPATIENT PHYSICAL THERAPY THORACOLUMBAR EVALUATION/DC   Patient Name: Elaine Jenkins MRN: [id number] DOB:1961-06-14, 63 y.o., female Today's Date: 09/04/2023  END OF SESSION:  PT End of Session - 09/04/23 0936     Visit Number 3    Number of Visits 7    Date for PT Re-Evaluation 10/10/23    Authorization Type [employer] COMM PPO    PT Start Time [phone]    PT Stop Time 0915    PT Time Calculation (min) 40 min    Activity Tolerance Patient tolerated treatment well    Behavior During Therapy [facility] for tasks assessed/performed               Past Medical History:  Diagnosis Date   Allergy    Full allergy blood testing panel (environmental + foods) NORMAL 06/2018 (Dr. Green).   Anemia    Arthritis    rt, shoulder   Asymptomatic cholelithiasis 2019/2020   Atypical chest pain    cardiac w/u normal   Chronic renal insufficiency, stage 3 (moderate) (HCC)    Stage II/III (GFR 50s-60s).  No proteinuria.  ARB added 12/2010 by nephrologist for renal protection.  Cr 1.1, GFR 55 on labs with onc 07/15/17.   Colon cancer (HCC) 08/2014   (METASTATIC TO LIVER 2018)--Initial dx 09/2014-Low anterior resection of rectum-laparoscopic, + chemo X about 8 cycles with dose reductions for toxicity.  2018--CEA continued to rise so PET scan done; liver lesion showed up. Partial hepatectomy 12/2016.  f/u imaging 07/16/17--no signn of recurrence.  Prominence of left adenexa noted so onc ordered pelvic u/s.   GERD (gastroesophageal reflux disease)    A LITTEL,TAKE TUMS   Gout    Uric acid level decreased from 9 to 4.1 with addition of uloric  40 mg qd (02/2011).   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  Also hx of PIH.   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  Stable on CT w/contrast 07/16/17, 05/2018, and 09/08/2018.   Postmenopausal    Prediabetes    June 2024 fasting gluc 106 and Hba1c 5.8%   Premature ventricular contractions (PVCs) (VPCs) 2021   stress test normal, echo normal.  Rhythm monitoring ordered but not scheduled as of 08/2020.   Right shoulder pain 2017   Dr. Josefina: pt did well with injection and PT.  Adhesive capsulitis vs cuff pathology: another injection by Dr. Josefina 05/20/16 helped x 2 wks.  MRI is next step: pt is considering this.   Seborrheic dermatitis    Face; consider contact derm (around eyes); [facility] 09/2012--desonide 0.05% cream rx'd   Solitary pulmonary nodule 2016/17   Stable on f/u CT imaging as of 10/2015.  Stable/unchanged 4 mm nodule RML on CT chest w/contrast 07/16/17.   Systolic murmur 06/2020   echo normal   Past Surgical History:  Procedure Laterality Date   APPENDECTOMY  1964   BREAST REDUCTION SURGERY  2003   bilateral   CARDIOVASCULAR STRESS  TEST  05/31/2020   myoc perf img NORMAL/LOW RISK   CESAREAN SECTION  1998   COLON SURGERY  09/2014   [facility]   COLONOSCOPY N/A 08/17/2014   Procedure: COLONOSCOPY;  Surgeon: Gwendlyn ONEIDA Buddy, MD;  Location: [facility];  Service: Endoscopy;  Laterality: N/A;   COLONOSCOPY  01/2018   Polypectomy--tubular adenoma with NO high grade dysplasia.  Repeat 1-2 yrs for surveillance.   COLONOSCOPY  05/16/2022   multiple polyps-->recall 11yrs   COLONOSCOPY W/ POLYPECTOMY  11/12/2016   Multiple polyps,some adenomatous but no high grade dysplasia: recall 1 yr for surveillance.   LAPAROSCOPIC PARTIAL HEPATECTOMY  12/11/2016   liver mass excision     LIVER SURGERY     low anter resect rectum  09/12/2014   for colon  cancer; [facility], Dr. Maurie   LUMBAR LAMINECTOMY  2004   microdiscectomy   LUMBAR LAMINECTOMY/DECOMPRESSION MICRODISCECTOMY Left 05/21/2013   Procedure: LUMBAR LAMINECTOMY MICRODISCECTOMY L5-S1 LEFT ;  Surgeon: Tanda DELENA Heading, MD;  Location: [facility];  Service: Orthopedics;  Laterality: Left;   NEPHRECTOMY  1964   Wilms tumor (left)   POLYPECTOMY     TRANSTHORACIC ECHOCARDIOGRAM  06/2020   Normal except grd II DD   Patient Active Problem List   Diagnosis Date Noted   Chronic eczematous otitis externa of both ears 07/30/2021   Allergic reaction/history of food allergy 06/22/2018   Chronic rhinitis 06/22/2018   Conjunctivitis 06/22/2018   Benign neoplasm of sigmoid colon 08/17/2014   Benign neoplasm of rectum 08/17/2014   Benign neoplasm of transverse colon 08/17/2014   Colonic mass 08/17/2014   Hematochezia 08/17/2014   Anemia, iron deficiency 08/17/2014   S/p nephrectomy 08/11/2014   Spinal stenosis, lumbar region, with neurogenic claudication 05/21/2013   Herniated lumbar intervertebral disc 05/21/2013   Lumbar strain 04/29/2013    PCP: Candise Aleene DEL, MD  REFERRING PROVIDER: Candise Aleene DEL, MD   REFERRING DIAG:  [phone] (ICD-10-CM) - Acute left-sided low back pain with left-sided sciatica  Z98.890 (ICD-10-CM) - History of lumbar surgery  M47.816 (ICD-10-CM) - Lumbar spondylosis    Rationale for Evaluation and Treatment: Rehabilitation  THERAPY DIAG:  Left-sided low back pain with left-sided sciatica, unspecified chronicity  Abnormal posture  Difficulty in walking, not elsewhere classified  ONSET DATE: November 2024  SUBJECTIVE:                                                                                                                                                                                           SUBJECTIVE STATEMENT: Pt reports she is having more good days than bad before starting PT. Prolonged standing and  walking are her biggest issues. N/T on  L ant/lat calf  EVAL: In November noticed L hip pinch when walking which moved to L ant/lateral calf pain . Received steriods which are helpful. Wrose in the Am and gets better during the day and worsens in the evening.  PERTINENT HISTORY:   Scoliosis- L rib hump, Microdiscectomy 2004,  Lumbar laminectomy 2015, Renal surgery at 63yo impacted her abdominals  PAIN:  Are you having pain? Yes: NPRS scale: 2/10 Pain location: L lateral calf Pain description: ache c a numb quality Aggravating factors: Prolonged standing walking Relieving factors: Sitting, lying down  PRECAUTIONS: None  RED FLAGS: None   WEIGHT BEARING RESTRICTIONS: No  FALLS:  Has patient fallen in last 6 months? No  LIVING ENVIRONMENT: Lives with: lives with their family Lives in: House/apartment  OCCUPATION: not working  PLOF: Independent  PATIENT GOALS: Pain relief  NEXT MD VISIT: To schedule as needed  OBJECTIVE:  Note: Objective measures were completed at Evaluation unless otherwise noted.  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; light numbness L ant/lat calf  MUSCLE LENGTH: Hamstrings: Right 60 deg; Left 60 deg Thomas test: Right NT deg; Left NT deg  POSTURE: increased lumbar lordosis and L shoulder shift on pelvis  PALPATION: Not TTP  LUMBAR ROM:   AROM eval  Flexion Min limited, mid sheen, tight low back  Extension Mod limited, stiff  Right lateral flexion Full, no pain  Left lateral flexion Full, no pain  Right rotation Full, no pain  Left rotation Full, no pain   (Blank rows = not tested)  LOWER EXTREMITY ROM:     Grossly WNLs Active  Right eval Left eval  Hip flexion    Hip extension    Hip abduction    Hip adduction    Hip internal rotation    Hip external  rotation    Knee flexion    Knee extension    Ankle dorsiflexion    Ankle plantarflexion    Ankle inversion    Ankle eversion     (Blank rows = not tested)  LOWER EXTREMITY MMT:    Weak core, statis bridge 30 MMT Right eval Left eval  Hip flexion 4 4  Hip extension 4 4  Hip abduction 4 4  Hip adduction    Hip internal rotation    Hip external rotation 4 4  Knee flexion    Knee extension    Ankle dorsiflexion    Ankle plantarflexion    Ankle inversion    Ankle eversion     (Blank rows = not tested)  LUMBAR SPECIAL TESTS:  Straight leg raise test: Negative and Slump test: Negative  FUNCTIONAL TESTS:  NT  GAIT: Distance walked: 200' Assistive device utilized: None Level of assistance: Complete Independence Comments: WNLs  TREATMENT DATE:  [facility] Adult PT Treatment:                                                DATE: 09/04/23 Therapeutic Exercise: NuStep 4 min L5 UE/LE while taking subjective intake Seated trunk flexion c swiss ball forward and laterally Left Standing Lateral Shift Correction at  Wall 10 reps - 2 hold Seated Quadratus Lumborum Stretch 3 reps - 30 hold Single Knee to Chest 3 reps - 20 hold Supine Transversus Abdominis Bracing 10 reps - 3 hold Seated core c swiss ball press x10 3 Supine Bridge 15 reps - 5 hold, neutral spine Standing shoulder ext 2x10 GTB HEP modified and updated Self Care: Pt Ed re: neutral spine for reduction of pain  [facility] Adult PT Treatment:                                                DATE: 08/27/23 Therapeutic Exercise: NuStep 4 min L5 UE/LE while taking subjective intake Seated trunk flexion c swiss ball forward and laterally Left Standing Lateral Shift Correction at Wall 10 reps - 2 hold Seated Quadratus Lumborum Stretch 3 reps - 30 hold Single Knee to Chest 3 reps - 20 hold Supine Transversus Abdominis Bracing 10 reps - 3 hold Supine marching with core engaged 2x 10 Supine Bridge 15 reps - 5 hold HEP modified and  updated  [facility] Adult PT Treatment:                                                DATE: 08/21/23 Therapeutic Exercise: Developed, instructed in, and pt completed therex as noted in HEP  Self Care: Recommended sleeping positions                                                                                                                               PATIENT EDUCATION:  Education details: Eval findings, POC, HEP, self care  Person educated: Patient Education method: Explanation, Demonstration, Tactile cues, Verbal cues, and Handouts Education comprehension: verbalized understanding, returned demonstration, verbal cues required, and tactile cues required  HOME EXERCISE PROGRAM: Access Code: [facility] URL: [email or web address] Date: 08/27/2023 Prepared by: Dasie Daft  Exercises - Left Standing Lateral Shift Correction at Wall - Hold  - 2-3 x daily - 7 x weekly - 1 sets - 10 reps - 2 hold - Seated Flexion Stretch with Swiss Ball  - 1 x daily - 7 x weekly - 1 sets - 5 reps - 5-20 hold - Seated Thoracic Flexion and Rotation with Swiss Ball  - 1 x daily - 7 x weekly - 1 sets - 4 reps - 5-20 hold - Seated Quadratus Lumborum Stretch in Chair (Mirrored)  - 1 x daily - 7 x weekly - 1 sets - 3 reps - 20 hold - Supine Single Knee to Chest  - 1 x daily - 7 x weekly - 3 sets - 10 reps - Supine Bridge  - 2 x daily - 7 x weekly - 2 sets - 10  reps - 5 hold - Supine Transversus Abdominis Bracing - Hands on Ground  - 1 x daily - 7 x weekly - 2 sets - 10 reps - 3 hold - Supine March  - 1 x daily - 7 x weekly - 2 sets - 10 reps  ASSESSMENT:  CLINICAL IMPRESSION: PT was completed for lumbopelvic flexibility and strengthening. Pt's continues to make positive improvement re: pain and L ant/lat calf symptoms. Discussed neutral spine positioning with pt voicing understanding. Pt tolerated prescribed therex today without adverse effects. Pt will continue to benefit from skilled PT to address  impairments for improved back function with minimized pain.    initial response to therex/HEP has been positive. Pt has been consistent with her HEP and demonstrates proper understanding of them. HEP was modified for improved affect. Pt tolerated prescribed therex today without adverse effects. Pt will continue to benefit from skilled PT to address impairments for improved function with minimized pain.  EVAL:  a 63 y.o. female who was seen today for physical therapy evaluation and treatment for  M54.42 (ICD-10-CM) - Acute left-sided low back pain with left-sided sciatica  Z98.890 (ICD-10-CM) - History of lumbar surgery  M47.816 (ICD-10-CM) - Lumbar spondylosis  Pt presents with abnormal postural changes which appear to be contributing L LE radicular symptoms. Additionally, pt demonstrates weak hips and core. Following completion of therex/HEP, pt reported L ant/lat calf pain had resolved. Pt will benefit from skilled PT 1w6 to address impairments to optimize back function with less pain.     OBJECTIVE IMPAIRMENTS: decreased activity tolerance, difficulty walking, decreased strength, postural dysfunction, and pain.   ACTIVITY LIMITATIONS: carrying, lifting, bending, standing, and locomotion level  PARTICIPATION LIMITATIONS: meal prep, cleaning, laundry, and community activity  PERSONAL FACTORS: Fitness, Past/current experiences, Time since onset of injury/illness/exacerbation, and 1 comorbidity: Hx of multiple back surgeries are also affecting patient's functional outcome.   REHAB POTENTIAL: Good  CLINICAL DECISION MAKING: Evolving/moderate complexity  EVALUATION COMPLEXITY: Moderate   GOALS:   SHORT TERM GOALS = LTGs  LONG TERM GOALS: Target date: 10/10/23  Pt will be Ind in a final HEP to maintain achieved LOF  Baseline: started Goal status: MET  2.  Pt will report 75% or greater improvement with the ant/lat calf pain with daily activities. Baseline: 5/10 Goal status:  ONGOING  3.  Pt will demonstrate improved hip strength per sustaining a bridge for 90sec Baseline: 30 sec Goal status: INITIAL  4.  Pt's FOTO score will improved to the predicted value of 53% as indication of improved function  Baseline: 61% Goal status: INITIAL  PLAN:  PT FREQUENCY: 1x/week  PT DURATION: 6 weeks  PLANNED INTERVENTIONS: 97164- PT Re-evaluation, 97110-Therapeutic exercises, 97530- Therapeutic activity, 97112- Neuromuscular re-education, 97535- Self Care, [id number]- Manual therapy, 97014- Electrical stimulation (unattended), [id number]- Electrical stimulation (manual), Patient/Family education, Taping, Dry Needling, Joint mobilization, Spinal mobilization, Cryotherapy, and Moist heat.  PLAN FOR NEXT SESSION: Review FOTO; assess response to HEP; body mechanics; progress therex as indicated; use of modalities, manual therapy; and TPDN as indicated.  Shahad Mazurek MS, PT 09/04/23 2:36 PM   PHYSICAL THERAPY DISCHARGE SUMMARY  Visits from Start of Care: 3  Current functional level related to goals / functional outcomes: unknown   Remaining deficits: unknown   Education / Equipment: HEP/Pt ED   Patient agrees to discharge. Patient goals were not met. Patient is being discharged due to not returning since the last visit.  Shantaya Bluestone MS, PT 09/09/24 3:45 PM    "

## 2023-09-04 ENCOUNTER — Ambulatory Visit: Payer: BC Managed Care – PPO

## 2023-09-04 DIAGNOSIS — Z9889 Other specified postprocedural states: Secondary | ICD-10-CM | POA: Diagnosis not present

## 2023-09-04 DIAGNOSIS — R293 Abnormal posture: Secondary | ICD-10-CM | POA: Diagnosis not present

## 2023-09-04 DIAGNOSIS — R262 Difficulty in walking, not elsewhere classified: Secondary | ICD-10-CM | POA: Diagnosis not present

## 2023-09-04 DIAGNOSIS — M5442 Lumbago with sciatica, left side: Secondary | ICD-10-CM

## 2023-09-04 DIAGNOSIS — M47816 Spondylosis without myelopathy or radiculopathy, lumbar region: Secondary | ICD-10-CM | POA: Diagnosis not present

## 2023-09-16 ENCOUNTER — Ambulatory Visit: Payer: BC Managed Care – PPO

## 2023-12-04 ENCOUNTER — Encounter: Payer: Self-pay | Admitting: Family Medicine

## 2023-12-08 ENCOUNTER — Ambulatory Visit (INDEPENDENT_AMBULATORY_CARE_PROVIDER_SITE_OTHER): Admitting: Urgent Care

## 2023-12-08 ENCOUNTER — Encounter: Payer: Self-pay | Admitting: Urgent Care

## 2023-12-08 VITALS — BP 143/94 | HR 82 | Temp 98.2°F | Wt 188.0 lb

## 2023-12-08 DIAGNOSIS — J3489 Other specified disorders of nose and nasal sinuses: Secondary | ICD-10-CM

## 2023-12-08 DIAGNOSIS — J4521 Mild intermittent asthma with (acute) exacerbation: Secondary | ICD-10-CM

## 2023-12-08 DIAGNOSIS — R0981 Nasal congestion: Secondary | ICD-10-CM | POA: Diagnosis not present

## 2023-12-08 DIAGNOSIS — M109 Gout, unspecified: Secondary | ICD-10-CM

## 2023-12-08 DIAGNOSIS — I10 Essential (primary) hypertension: Secondary | ICD-10-CM | POA: Diagnosis not present

## 2023-12-08 DIAGNOSIS — B001 Herpesviral vesicular dermatitis: Secondary | ICD-10-CM

## 2023-12-08 MED ORDER — ACYCLOVIR 5 % EX OINT
1.0000 | TOPICAL_OINTMENT | CUTANEOUS | 3 refills | Status: AC
Start: 2023-12-08 — End: ?

## 2023-12-08 MED ORDER — VALACYCLOVIR HCL 1 G PO TABS: ORAL_TABLET | ORAL | 6 refills | Status: AC

## 2023-12-08 MED ORDER — FEBUXOSTAT 40 MG PO TABS: ORAL_TABLET | ORAL | 0 refills | Status: DC

## 2023-12-08 MED ORDER — MONTELUKAST SODIUM 10 MG PO TABS: 10.0000 mg | ORAL_TABLET | Freq: Every day | ORAL | 0 refills | Status: DC

## 2023-12-08 MED ORDER — IRBESARTAN 150 MG PO TABS: ORAL_TABLET | ORAL | 0 refills | Status: DC

## 2023-12-08 MED ORDER — PREDNISONE 10 MG (21) PO TBPK: ORAL_TABLET | Freq: Every day | ORAL | 0 refills | Status: DC

## 2023-12-08 MED ORDER — FEBUXOSTAT 40 MG PO TABS
ORAL_TABLET | ORAL | 0 refills | Status: DC
Start: 2023-12-08 — End: 2023-12-08

## 2023-12-08 NOTE — Progress Notes (Signed)
 Established Patient Office Visit  Subjective:  Patient ID: Elaine Jenkins, female    DOB: [birth year]  Age: 63 y.o. MRN: [id number]  Chief Complaint  Patient presents with   Cough    Pt has been having a cough for about 5 days. She has also been having wheezing and runny nose. She states she gets these symptoms every year.     HPI  Discussed the use of AI scribe software for clinical note transcription with the patient, who gave verbal consent to proceed.  History of Present Illness   Elaine Jenkins is a 63 year old female with a history of numerous cancers treated with radiation and chemotherapy who presents with symptoms of a spring cold.  She has been experiencing symptoms consistent with a spring cold for the past ten days, which began after her husband fell ill. Her symptoms include constant coughing, phlegm production with thick but clear mucus, a runny nose requiring two boxes of tissues daily, and difficulty sleeping due to wheezing when lying down. She notes slight improvement today, marking the first day she feels 'human'.  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 Consequently, every cold leads to significant respiratory issues, often requiring medical intervention with steroids. Her husband, who also fell ill, tested negative for COVID-19 and flu, and his chest X-ray was negative. While his symptoms have improved, hers have persisted.  No sore throat or ear pain. She mentions having chills and possibly a fever during the initial days of her illness. She also experienced a headache for one day. She has environmental allergies, including to latex, powder, and some fruits, but describes them as tolerable. She suspects the large [place] near her home might contribute to her symptoms.  She has been taking Mucinex and tried Benadryl  without significant relief. She does not use sinus  rinses and generally avoids medications unless necessary. Her past medical history includes colon and liver cancer, for which she is now six years cancer-free. She is currently on Uloric  and irbesartan , and uses Valtrex  and acyclovir  ointment for cold sores. She is requesting a refill of these medications, particularly due to her active cold sore.     Patient Active Problem List   Diagnosis Date Noted   Chronic eczematous otitis externa of both ears 07/30/2021   Allergic reaction/history of food allergy 06/22/2018   Chronic rhinitis 06/22/2018   Conjunctivitis 06/22/2018   Benign neoplasm of sigmoid colon 08/17/2014   Benign neoplasm of rectum 08/17/2014   Benign neoplasm of transverse colon 08/17/2014   Colonic mass 08/17/2014   Hematochezia 08/17/2014   Anemia, iron deficiency 08/17/2014   S/p nephrectomy 08/11/2014   Spinal stenosis, lumbar region, with neurogenic claudication 05/21/2013   Herniated lumbar intervertebral disc 05/21/2013   Lumbar strain 04/29/2013   Past Medical History:  Diagnosis Date   Allergy    Full allergy blood testing panel (environmental + foods) NORMAL 06/2018 (Dr. Arlene Ben).   Anemia    Arthritis    rt, shoulder   Asymptomatic cholelithiasis 2019/2020   Atypical chest pain    cardiac w/u normal   Chronic renal insufficiency, stage 3 (moderate) (HCC)    Stage II/III (GFR 50s-60s).  No proteinuria.  ARB added 12/2010 by nephrologist for renal protection.  Cr 1.1, GFR 55 on labs with onc 07/15/17.   Colon cancer (HCC) 08/2014   (METASTATIC TO LIVER 2018)--Initial dx 09/2014-Low anterior  resection of rectum-laparoscopic, + chemo X about 8 cycles with dose reductions for toxicity.  2018--CEA continued to rise so PET scan done; liver lesion showed up. Partial hepatectomy 12/2016.  f/u imaging 07/16/17--no signn of recurrence.  Prominence of left adenexa noted so onc ordered pelvic u/s.   GERD (gastroesophageal reflux disease)    "A LITTEL,TAKE TUMS"   Gout     Uric acid level decreased from 9 to 4.1 with addition of uloric  40 mg qd (02/2011).   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  Also hx of PIH.   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  Stable on CT w/contrast 07/16/17, 05/2018, and 09/08/2018.   Postmenopausal    Prediabetes    June 2024 fasting gluc 106 and Hba1c 5.8%   Premature ventricular contractions (PVCs) (VPCs) 2021   stress test normal, echo normal.  Rhythm monitoring ordered but not scheduled as of 08/2020.   Right shoulder pain 2017   Dr. Agatha Horsfall: pt did well with injection and PT.  Adhesive capsulitis vs cuff pathology: another injection by Dr. Agatha Horsfall 05/20/16 helped x 2 wks.  MRI is next step: pt is considering this.   Seborrheic dermatitis    Face; consider contact derm (around eyes); [facility] 09/2012--desonide 0.05% cream rx'd   Solitary pulmonary nodule 2016/17   Stable on f/u CT imaging as of 10/2015.  Stable/unchanged 4 mm nodule RML on CT chest w/contrast 07/16/17.   Systolic murmur 06/2020   echo normal   Past Surgical History:  Procedure Laterality Date   APPENDECTOMY  1964   BREAST REDUCTION SURGERY  2003   bilateral   CARDIOVASCULAR STRESS TEST  05/31/2020   myoc perf img NORMAL/LOW RISK   CESAREAN SECTION  1998   COLON SURGERY  09/2014   [facility]   COLONOSCOPY N/A 08/17/2014   Procedure: COLONOSCOPY;  Surgeon: Asencion Blacksmith, MD;  Location: [facility];  Service: Endoscopy;  Laterality: N/A;   COLONOSCOPY  01/2018    Polypectomy--tubular adenoma with NO high grade dysplasia.  Repeat 1-2 yrs for surveillance.   COLONOSCOPY  05/16/2022   multiple polyps-->recall 69yrs   COLONOSCOPY W/ POLYPECTOMY  11/12/2016   Multiple polyps,some adenomatous but no high grade dysplasia: recall 1 yr for surveillance.   LAPAROSCOPIC PARTIAL HEPATECTOMY  12/11/2016   liver mass excision     LIVER SURGERY     low anter resect rectum  09/12/2014   for colon cancer; [facility], Dr. Pinkie Brigham   LUMBAR LAMINECTOMY  2004   microdiscectomy   LUMBAR LAMINECTOMY/DECOMPRESSION MICRODISCECTOMY Left 05/21/2013   Procedure: LUMBAR LAMINECTOMY MICRODISCECTOMY L5-S1 LEFT ;  Surgeon: Florencia Hunter, MD;  Location: [facility];  Service: Orthopedics;  Laterality: Left;   NEPHRECTOMY  1964   Wilms tumor (left)   POLYPECTOMY     TRANSTHORACIC ECHOCARDIOGRAM  06/2020   Normal except grd II DD   Social History   Tobacco Use   Smoking status: Former  Types: Cigarettes    Passive exposure: Past (MOTHER SMOKER)   Smokeless tobacco: Never   Tobacco comments:    light in college  Vaping Use   Vaping status: Never Used  Substance Use Topics   Alcohol use: Yes    Alcohol/week: 1.0 standard drink of alcohol    Types: 1 Cans of beer per week    Comment: holidays and special occasions; 3-4 times yearly   Drug use: No    Frequency: 1.0 times per week      ROS: as noted in HPI  Objective:     BP (!) 143/94   Pulse 82   Temp 98.2 F (36.8 C) (Oral)   Wt 188 lb (85.3 kg)   SpO2 98%   BMI 31.28 kg/m  BP Readings from Last 3 Encounters:  12/08/23 (!) 143/94  08/04/23 131/83  02/03/23 (!) 156/86   Wt Readings from Last 3 Encounters:  12/08/23 188 lb (85.3 kg)  08/04/23 188 lb (85.3 kg)  02/03/23 189 lb 3.2 oz (85.8 kg)      Physical Exam Vitals and nursing note reviewed.  Constitutional:      General: She is not in acute distress.    Appearance: Normal appearance. She is not ill-appearing, toxic-appearing or diaphoretic.  HENT:      Head: Normocephalic and atraumatic.     Right Ear: No swelling or tenderness. A middle ear effusion is present. No mastoid tenderness. Tympanic membrane is not injected, perforated or erythematous.     Left Ear: No swelling or tenderness.  No middle ear effusion. No mastoid tenderness. Tympanic membrane is not injected, perforated or erythematous.     Nose: Congestion and rhinorrhea present. Rhinorrhea is clear.     Right Turbinates: Swollen. Not enlarged.     Left Turbinates: Swollen.     Right Sinus: No maxillary sinus tenderness or frontal sinus tenderness.     Left Sinus: No maxillary sinus tenderness or frontal sinus tenderness.     Mouth/Throat:     Lips: Pink. Lesions present.     Mouth: Mucous membranes are moist. No oral lesions.     Pharynx: Oropharynx is clear. Uvula midline. Posterior oropharyngeal erythema present. No uvula swelling or postnasal drip.   Cardiovascular:     Rate and Rhythm: Normal rate and regular rhythm.  Pulmonary:     Effort: Pulmonary effort is normal. No respiratory distress.     Breath sounds: Normal breath sounds. No stridor. No wheezing, rhonchi or rales.  Chest:     Chest wall: No tenderness.  Musculoskeletal:     Cervical back: Normal range of motion and neck supple. No rigidity or tenderness.  Lymphadenopathy:     Cervical: No cervical adenopathy.  Skin:    General: Skin is warm and dry.  Neurological:     Mental Status: She is alert and oriented to person, place, and time.      No results found for any visits on 12/08/23.  Last CBC Lab Results  Component Value Date   WBC 6.7 02/03/2023   HGB 12.6 02/03/2023   HCT 38.8 02/03/2023   MCV 94.1 02/03/2023   MCH 29.9 05/20/2013   RDW 14.0 02/03/2023   PLT 198.0 02/03/2023   Last metabolic panel Lab Results  Component Value Date   GLUCOSE 106 (H) 02/03/2023   NA 139 02/03/2023   K 4.5 02/03/2023   CL 108 02/03/2023   CO2 25 02/03/2023   BUN 21 02/03/2023   CREATININE 1.16  02/03/2023   GFR 50.49 (L) 02/03/2023   CALCIUM 10.0 02/03/2023   PHOS 3.6 08/01/2014   PROT 6.7 02/03/2023   ALBUMIN 4.1 02/03/2023   BILITOT 0.8 02/03/2023   ALKPHOS 80 02/03/2023   AST 19 02/03/2023   ALT 16 02/03/2023   Last lipids Lab Results  Component Value Date   CHOL 167 02/03/2023   HDL 51.90 02/03/2023   LDLCALC 95 02/03/2023   TRIG 101.0 02/03/2023   CHOLHDL 3 02/03/2023      The 40-JWJX ASCVD risk score (Arnett DK, et al., 2019) is: 6.9%  Assessment & Plan:  Nasal congestion with rhinorrhea -     Montelukast Sodium; Take 1 tablet (10 mg total) by mouth at bedtime.  Dispense: 14 tablet; Refill: 0 -     predniSONE ; Take by mouth daily. Take 6 tabs by mouth daily  for 1 days, then 5 tabs for 1 days, then 4 tabs for 1 days, then 3 tabs for 1 days, 2 tabs for 1 days, then 1 tab by mouth daily for 1 days  Dispense: 21 tablet; Refill: 0  Mild intermittent reactive airway disease with acute exacerbation -     Montelukast Sodium; Take 1 tablet (10 mg total) by mouth at bedtime.  Dispense: 14 tablet; Refill: 0 -     predniSONE ; Take by mouth daily. Take 6 tabs by mouth daily  for 1 days, then 5 tabs for 1 days, then 4 tabs for 1 days, then 3 tabs for 1 days, 2 tabs for 1 days, then 1 tab by mouth daily for 1 days  Dispense: 21 tablet; Refill: 0  Essential hypertension -     Irbesartan ; TAKE 1 TABLET(150 MG) BY MOUTH DAILY  Dispense: 90 tablet; Refill: 0  Gout, unspecified cause, unspecified chronicity, unspecified site -     Febuxostat ; TAKE ONE TABLET BY MOUTH EVERY MORNING. DUE FOR APPT JULY  Dispense: 90 tablet; Refill: 0  Recurrent cold sores -     Acyclovir ; Apply 1 Application topically every 3 (three) hours.  Dispense: 5 g; Refill: 3 -     valACYclovir  HCl; 2 tabs po q12h x 2 doses  Dispense: 4 tablet; Refill: 6  Assessment and Plan    Chronic cough with sputum production Chronic cough with thick, clear sputum likely exacerbated by recent upper respiratory  infection. Compromised lung function due to childhood radiation therapy for wilms tumor. Previous steroid treatment effective. - Prescribe prednisone  for 6 days to reduce inflammation and mucus production. - Consider short-term montelukast for potential pollen-induced symptoms.  Environmental allergies Environmental allergies possibly exacerbated by proximity to large [employer]. Symptoms include itchy eyes and runny nose during pollen season. - may purchase OTC allegra - consider short course of saline nasal spray and/or OTC flonase   Cold sores (Herpes labialis) Cold sores managed with Valtrex  and acyclovir  ointment. Insurance coverage confirmed. - Refill Valtrex  prescription.   HTN Usually controlled, pt in need of med refill  Gout Stable on uloric , refill today.        No follow-ups on file.   Mandy Second, PA

## 2023-12-08 NOTE — Patient Instructions (Signed)
 Please start taking the prednisone  pack as directed. Best taken with breakfast in the morning. Please also start taking montelukast at night. This medication may make you feel drowsy. You may discontinue when your symptoms clear up.  Additionally I have refilled your blood pressure, gout and cold sore medications.  Please keep your appointment in June as currently scheduled.

## 2023-12-11 ENCOUNTER — Other Ambulatory Visit (HOSPITAL_COMMUNITY): Payer: Self-pay

## 2024-01-21 ENCOUNTER — Encounter: Payer: Self-pay | Admitting: Cardiology

## 2024-01-21 ENCOUNTER — Ambulatory Visit: Attending: Cardiology | Admitting: Cardiology

## 2024-01-21 VITALS — BP 138/88 | HR 78 | Ht 65.0 in | Wt 188.0 lb

## 2024-01-21 DIAGNOSIS — I700 Atherosclerosis of aorta: Secondary | ICD-10-CM | POA: Diagnosis not present

## 2024-01-21 DIAGNOSIS — R011 Cardiac murmur, unspecified: Secondary | ICD-10-CM | POA: Insufficient documentation

## 2024-01-21 DIAGNOSIS — I10 Essential (primary) hypertension: Secondary | ICD-10-CM | POA: Insufficient documentation

## 2024-01-21 DIAGNOSIS — E66811 Obesity, class 1: Secondary | ICD-10-CM | POA: Diagnosis not present

## 2024-01-21 DIAGNOSIS — Z905 Acquired absence of kidney: Secondary | ICD-10-CM

## 2024-01-21 NOTE — Progress Notes (Signed)
 Cardiology Office Note:    Date:  01/21/2024   ID:  Elaine Jenkins, DOB May 25, 1961, MRN [id number]  PCP:  Shelvia Dick, MD  Cardiologist:  Nelia Balzarine, MD   Referring MD: Shelvia Dick, MD    ASSESSMENT:    1. S/p nephrectomy   2. Essential hypertension   3. Obesity (BMI 30.0-34.9)   4. Murmur   5. Abdominal aortic atherosclerosis (HCC)    PLAN:    In order of problems listed above:  Abdominal aortic atherosclerosis: Secondary prevention stressed with the patient.  Importance of compliance with diet medication stressed and she vocalized understanding.  She is exercise on a regular basis-congratulated her about good protocol. Cardiac murmur: Echocardiogram will be done to assess murmur heard on auscultation. Statin therapy: She needs to be on statin therapy in view of atherosclerotic vascular disease.  Her goal LDL must be also less than 60.  I discussed this with her at length and she wants more time to think about it.  Benefits and potential risks of therapy explained and questions were answered to her satisfaction.  She will get back to us  if she wants to be initiated. Obesity: Weight reduction stressed diet emphasized and she promises to do better.  Risks of obesity explained. Patient will be seen in follow-up appointment in 6 months or earlier if the patient has any concerns.    Medication Adjustments/Labs and Tests Ordered: Current medicines are reviewed at length with the patient today.  Concerns regarding medicines are outlined above.  Orders Placed This Encounter  Procedures   EKG 12-Lead   ECHOCARDIOGRAM COMPLETE   No orders of the defined types were placed in this encounter.    History of Present Illness:    Elaine Jenkins is a 63 y.o. female who is being seen today for the evaluation of aortic atherosclerosis at the request of McGowen, Minetta Aly, MD. patient is a pleasant 63 year old female.  She is a retired Engineer, civil (consulting) by profession.  She has history  of nephrectomy for Wilms tumor when she was 63 years old and also has issues with colon cancer treated.  She is in remission.  She is an active lady.  She denies any history of hypertension dyslipidemia or diabetes mellitus.  She is obese.  She uses her exercise bicycle 25 minutes on a daily basis with no symptoms.  At the time of my evaluation, the patient is alert awake oriented and in no distress.  Past Medical History:  Diagnosis Date   Allergy    Full allergy blood testing panel (environmental + foods) NORMAL 06/2018 (Dr. Arlene Ben).   Anemia    Arthritis    rt, shoulder   Asymptomatic cholelithiasis 2019/2020   Atypical chest pain    cardiac w/u normal   Chronic renal insufficiency, stage 3 (moderate) (HCC)    Stage II/III (GFR 50s-60s).  No proteinuria.  ARB added 12/2010 by nephrologist for renal protection.  Cr 1.1, GFR 55 on labs with onc 07/15/17.   Colon cancer (HCC) 08/2014   (METASTATIC TO LIVER 2018)--Initial dx 09/2014-Low anterior resection of rectum-laparoscopic, + chemo X about 8 cycles with dose reductions for toxicity.  2018--CEA continued to rise so PET scan done; liver lesion showed up. Partial hepatectomy 12/2016.  f/u imaging 07/16/17--no signn of recurrence.  Prominence of left adenexa noted so onc ordered pelvic u/s.   GERD (gastroesophageal reflux disease)    A LITTEL,TAKE TUMS   Gout    Uric acid level decreased  from 9 to 4.1 with addition of uloric  40 mg qd (02/2011).   History of anemia    History of blood transfusion 1963   History of low back pain    History of nephrectomy, unilateral 1964   Wilms tumor at age 73 (right kidney)   History of subacute thyroiditis    HTN (hypertension)    since age 33.  Also hx of PIH.   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  Stable on CT w/contrast 07/16/17, 05/2018, and 09/08/2018.   Postmenopausal    Prediabetes    June 2024 fasting gluc 106 and Hba1c 5.8%   Premature ventricular contractions (PVCs) (VPCs) 2021   stress test normal, echo normal.  Rhythm monitoring ordered but not scheduled as of 08/2020.   Right shoulder pain 2017   Dr. Agatha Horsfall: pt did well with injection and PT.  Adhesive capsulitis vs cuff pathology: another injection by Dr. Agatha Horsfall 05/20/16 helped x 2 wks.  MRI is next step: pt is considering this.   Seborrheic dermatitis    Face; consider contact derm (around eyes); [facility] 09/2012--desonide 0.05% cream rx'd   Solitary pulmonary nodule 2016/17   Stable on f/u CT imaging as of 10/2015.  Stable/unchanged 4 mm nodule RML on CT chest w/contrast 07/16/17.   Systolic murmur 06/2020   echo normal    Past Surgical History:  Procedure Laterality Date   APPENDECTOMY  1964   BREAST REDUCTION SURGERY  2003   bilateral   CARDIOVASCULAR STRESS TEST  05/31/2020   myoc perf img NORMAL/LOW RISK   CESAREAN SECTION  1998   COLON SURGERY  09/2014   [facility]   COLONOSCOPY N/A 08/17/2014   Procedure: COLONOSCOPY;  Surgeon: Asencion Blacksmith, MD;  Location: [facility];  Service: Endoscopy;  Laterality: N/A;   COLONOSCOPY  01/2018   Polypectomy--tubular adenoma with NO high grade dysplasia.  Repeat 1-2 yrs for surveillance.   COLONOSCOPY  05/16/2022   multiple polyps-->recall 14yrs   COLONOSCOPY W/ POLYPECTOMY  11/12/2016   Multiple polyps,some adenomatous but no high grade dysplasia: recall 1 yr for surveillance.   LAPAROSCOPIC PARTIAL HEPATECTOMY  12/11/2016   liver mass excision     LIVER SURGERY     low anter resect rectum  09/12/2014   for colon cancer; [facility], Dr. Pinkie Brigham   LUMBAR LAMINECTOMY  2004   microdiscectomy   LUMBAR LAMINECTOMY/DECOMPRESSION MICRODISCECTOMY Left 05/21/2013   Procedure: LUMBAR  LAMINECTOMY MICRODISCECTOMY L5-S1 LEFT ;  Surgeon: Florencia Hunter, MD;  Location: [facility];  Service: Orthopedics;  Laterality: Left;   NEPHRECTOMY  1964   Wilms tumor (left)   POLYPECTOMY     TRANSTHORACIC ECHOCARDIOGRAM  06/2020   Normal except grd II DD    Current Medications: Current Meds  Medication Sig   acetaminophen  (TYLENOL ) 500 MG tablet Take 1,000 mg by mouth every 6 (six) hours as needed for pain.   acyclovir  ointment (ZOVIRAX ) 5 % Apply 1 Application topically every 3 (three) hours.   calcium carbonate (TUMS - DOSED IN MG ELEMENTAL CALCIUM) 500 MG chewable tablet Chew 1 tablet by mouth daily as needed for indigestion or heartburn.   colchicine  0.6 MG tablet Take 1 tablet (0.6 mg total) by  mouth 2 (two) times daily as needed. for gout flare   EPINEPHrine  (AUVI-Q ) 0.3 mg/0.3 mL IJ SOAJ injection Use as directed for severe allergic reaction   febuxostat  (ULORIC ) 40 MG tablet TAKE ONE TABLET BY MOUTH EVERY MORNING. DUE FOR APPT JULY   irbesartan  (AVAPRO ) 150 MG tablet TAKE 1 TABLET(150 MG) BY MOUTH DAILY   mometasone (ELOCON) 0.1 % cream Apply topically 2 (two) times daily as needed.   montelukast  (SINGULAIR ) 10 MG tablet Take 1 tablet (10 mg total) by mouth at bedtime.   neomycin -polymyxin-hydrocortisone (CORTISPORIN) 3.5-10000-1 OTIC suspension    predniSONE  (STERAPRED UNI-PAK 21 TAB) 10 MG (21) TBPK tablet Take by mouth daily. Take 6 tabs by mouth daily  for 1 days, then 5 tabs for 1 days, then 4 tabs for 1 days, then 3 tabs for 1 days, 2 tabs for 1 days, then 1 tab by mouth daily for 1 days   valACYclovir  (VALTREX ) 1000 MG tablet 2 tabs po q12h x 2 doses     Allergies:   Aldomet [methyldopa], Latex, Banana, Benzthiazide, Kiwi extract, Other, Peach [prunus persica], Plum pulp, Thiazide-type diuretics, Crab [shellfish allergy], and Tape   Social History   Socioeconomic History   Marital status: Married    Spouse name: Not on file   Number of children: Not on file   Years  of education: Not on file   Highest education level: Not on file  Occupational History   Not on file  Tobacco Use   Smoking status: Former    Types: Cigarettes    Passive exposure: Past (MOTHER SMOKER)   Smokeless tobacco: Never   Tobacco comments:    light in college  Vaping Use   Vaping status: Never Used  Substance and Sexual Activity   Alcohol use: Yes    Alcohol/week: 1.0 standard drink of alcohol    Types: 1 Cans of beer per week    Comment: holidays and special occasions; 3-4 times yearly   Drug use: No    Frequency: 1.0 times per week   Sexual activity: Yes  Other Topics Concern   Not on file  Social History Narrative   Married, one child.   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   Relocated to  from [place], [place] 2013.    No Tobacco.  Rare alcohol.  No drug use.    Social Drivers of Corporate investment banker Strain: Not on file  Food Insecurity: Not on file  Transportation Needs: Not on file  Physical Activity: Not on file  Stress: Not on file  Social Connections: Not on file     Family History: The patient's family history includes Alzheimer's disease in her father; COPD in her mother; Colon cancer in her father; Colon polyps in her brother and sister; Crohn's disease in her father; Heart disease in her father and mother; Hypertension in her father and mother. There is no history of Esophageal cancer, Rectal cancer, Stomach cancer, or Ulcerative colitis.  ROS:   Please see the history of present illness.    All other systems reviewed and are negative.  EKGs/Labs/Other Studies Reviewed:    The following studies were reviewed today:  EKG Interpretation Date/Time:  Wednesday January 21 2024 12:53:06 EDT Ventricular Rate:  78 PR Interval:  150 QRS Duration:  72 QT Interval:  364 QTC Calculation: 414 R Axis:   17  Text Interpretation: Normal sinus rhythm Normal ECG When compared with ECG of 20-May-2013 13:32, No significant change was found  Confirmed by Hillis Lu [phone]) on 01/21/2024 1:12:36 PM     Recent Labs: 02/03/2023: ALT 16; BUN 21; Creatinine, Ser 1.16; Hemoglobin 12.6; Platelets 198.0; Potassium 4.5; Sodium 139  Recent Lipid Panel    Component Value Date/Time   CHOL 167 02/03/2023 1020   TRIG 101.0 02/03/2023 1020   HDL 51.90 02/03/2023 1020   CHOLHDL 3 02/03/2023 1020   VLDL 20.2 02/03/2023 1020   LDLCALC 95 02/03/2023 1020    Physical Exam:    VS:  BP 138/88   Pulse 78   Ht 5' 5 (1.651 m)   Wt 188 lb (85.3 kg)   SpO2 97%   BMI 31.28 kg/m     Wt Readings from Last 3 Encounters:  01/21/24 188 lb (85.3 kg)  12/08/23 188 lb (85.3 kg)  08/04/23 188 lb (85.3 kg)     GEN: Patient is in no acute distress HEENT: Normal NECK: No JVD; No carotid bruits LYMPHATICS: No lymphadenopathy CARDIAC: S1 S2 regular, 2/6 systolic murmur at the apex. RESPIRATORY:  Clear to auscultation without rales, wheezing or rhonchi  ABDOMEN: Soft, non-tender, non-distended MUSCULOSKELETAL:  No edema; No deformity  SKIN: Warm and dry NEUROLOGIC:  Alert and oriented x 3 PSYCHIATRIC:  Normal affect    Signed, Nelia Balzarine, MD  01/21/2024 1:35 PM    [facility]

## 2024-01-21 NOTE — Patient Instructions (Addendum)
 Medication Instructions:  Your physician recommends that you continue on your current medications as directed. Please refer to the Current Medication list given to you today.  *If you need a refill on your cardiac medications before your next appointment, please call your pharmacy*  Lab Work: None ordered.  You may go to any [facility] Location for your lab work:  [facility] - [address] ([facility]) - [address] [id number]  [place] - [address]   [place]  - [address]   [place] - [address] - [address] [employer] [facility]  - [address] [place] - [address] ([facility]   If you have labs (blood work) drawn today and your tests are completely normal, you will receive your results only by: [employer] (if you have MyChart)  If you have any lab test that is abnormal or we need to change your treatment, we will call you or send a MyChart message to review the results.  Testing/Procedures: echocardiogram   Follow-Up: At [facility], you and your health needs are our priority.  As part of our continuing mission to provide you with exceptional heart care, we have created designated Provider Care Teams.  These Care Teams include your primary Cardiologist (physician) and Advanced Practice Providers (APPs -  Physician Assistants and Nurse Practitioners) who all work together to provide you with the care you need, when you need it.   Your next appointment:   9 months  The format for your next appointment:   In Person  Provider:   Hillis Lu, MD

## 2024-02-05 ENCOUNTER — Encounter: Payer: BC Managed Care – PPO | Admitting: Family Medicine

## 2024-02-09 ENCOUNTER — Encounter: Payer: Self-pay | Admitting: Family Medicine

## 2024-02-09 ENCOUNTER — Ambulatory Visit: Admitting: Family Medicine

## 2024-02-09 VITALS — BP 130/78 | HR 84 | Temp 97.5°F | Ht 65.0 in | Wt 188.6 lb

## 2024-02-09 DIAGNOSIS — Z0000 Encounter for general adult medical examination without abnormal findings: Secondary | ICD-10-CM

## 2024-02-09 DIAGNOSIS — N182 Chronic kidney disease, stage 2 (mild): Secondary | ICD-10-CM | POA: Diagnosis not present

## 2024-02-09 DIAGNOSIS — R7303 Prediabetes: Secondary | ICD-10-CM

## 2024-02-09 DIAGNOSIS — M109 Gout, unspecified: Secondary | ICD-10-CM

## 2024-02-09 DIAGNOSIS — I10 Essential (primary) hypertension: Secondary | ICD-10-CM

## 2024-02-09 MED ORDER — FEBUXOSTAT 40 MG PO TABS
ORAL_TABLET | ORAL | 3 refills | Status: AC
Start: 2024-02-09 — End: ?

## 2024-02-09 MED ORDER — COLCHICINE 0.6 MG PO TABS: 0.6000 mg | ORAL_TABLET | Freq: Two times a day (BID) | ORAL | 2 refills | Status: AC | PRN

## 2024-02-09 MED ORDER — IRBESARTAN 150 MG PO TABS: ORAL_TABLET | ORAL | 3 refills | Status: AC

## 2024-02-09 NOTE — Patient Instructions (Signed)

## 2024-02-09 NOTE — Progress Notes (Signed)
 Office Note 02/09/2024  CC:  Chief Complaint  Patient presents with   Annual Exam    HPI:  Patient is a 63 y.o. female who is here for annual health maintenance exam and follow-up of hypertension and chronic renal insufficiency stage II/III.  Elaine Jenkins feels like she is doing well. She has 1-2 episodes a week of feeling like she has a little difficulty getting a swallow of food or drink down.  After the first swallow everything seems to be fine.  No pain with swallowing, no regurgitation, no frequent reflux symptoms.  Low back has benefited from recumbent biking recently.  At the dentist recently her blood pressure was in normal range.  At the cardiologist earlier this month it was 138/80.  She intermittently feels a little bit of tiredness in her arms.  Her cardiologist, Dr. Hobart, so she just recently got established with Dr. Edwyna at [facility] on 01/21/2024.  He recommended she start a statin based on HTN and imaging showing aortic atherosclerosis. He has also set her up for an echocardiogram due to detection of a murmur.  Past Medical History:  Diagnosis Date   Allergy    Full allergy blood testing panel (environmental + foods) NORMAL 06/2018 (Dr. Green).   Anemia    Arthritis    rt, shoulder   Asymptomatic cholelithiasis 2019/2020   Atypical chest pain    cardiac w/u normal   Chronic renal insufficiency, stage 3 (moderate) (HCC)    Stage II/III (GFR 50s-60s).  No proteinuria.  ARB added 12/2010 by nephrologist for renal protection.  Cr 1.1, GFR 55 on labs with onc 07/15/17.   Colon cancer (HCC) 08/2014   (METASTATIC TO LIVER 2018)--Initial dx 09/2014-Low anterior resection of rectum-laparoscopic, + chemo X about 8 cycles with dose reductions for toxicity.  2018--CEA continued to rise so PET scan done; liver lesion showed up. Partial hepatectomy 12/2016.  f/u imaging 07/16/17--no signn of recurrence.  Prominence of left adenexa noted so onc ordered pelvic u/s.   GERD  (gastroesophageal reflux disease)    A LITTEL,TAKE TUMS   Gout    Uric acid level decreased from 9 to 4.1 with addition of uloric  40 mg qd (02/2011).   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  Also hx of PIH.   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  Stable on CT w/contrast 07/16/17, 05/2018, and 09/08/2018.   Postmenopausal    Prediabetes    June 2024 fasting gluc 106 and Hba1c 5.8%   Premature ventricular contractions (PVCs) (VPCs) 2021   stress test normal, echo normal.  Rhythm monitoring ordered but not scheduled as of 08/2020.   Right shoulder pain 2017   Dr. Josefina: pt did well with injection and PT.  Adhesive capsulitis vs cuff pathology: another injection by Dr. Josefina 05/20/16 helped x 2 wks.  MRI is next step: pt is considering this.   Seborrheic dermatitis    Face; consider contact derm (around eyes);  [facility] 09/2012--desonide 0.05% cream rx'd   Solitary pulmonary nodule 2016/17   Stable on f/u CT imaging as of 10/2015.  Stable/unchanged 4 mm nodule RML on CT chest w/contrast 07/16/17.   Systolic murmur 06/2020   echo normal    Past Surgical History:  Procedure Laterality Date   APPENDECTOMY  1964   BREAST REDUCTION SURGERY  2003   bilateral   CARDIOVASCULAR STRESS TEST  05/31/2020   myoc perf img NORMAL/LOW RISK   CESAREAN SECTION  1998   COLON SURGERY  09/2014   [facility]   COLONOSCOPY N/A 08/17/2014   Procedure: COLONOSCOPY;  Surgeon: Gwendlyn ONEIDA Buddy, MD;  Location: [facility]  ENDOSCOPY;  Service: Endoscopy;  Laterality: N/A;   COLONOSCOPY  01/2018   Polypectomy--tubular adenoma with NO high grade dysplasia.  Repeat 1-2 yrs for surveillance.   COLONOSCOPY  05/16/2022   multiple polyps-->recall 35yrs   COLONOSCOPY W/ POLYPECTOMY  11/12/2016   Multiple polyps,some adenomatous but no high grade dysplasia: recall 1 yr for surveillance.   LAPAROSCOPIC PARTIAL HEPATECTOMY  12/11/2016   liver mass excision     LIVER SURGERY     low anter resect rectum  09/12/2014   for colon cancer; [facility], Dr. Maurie   LUMBAR LAMINECTOMY  2004   microdiscectomy   LUMBAR LAMINECTOMY/DECOMPRESSION MICRODISCECTOMY Left 05/21/2013   Procedure: LUMBAR LAMINECTOMY MICRODISCECTOMY L5-S1 LEFT ;  Surgeon: Tanda DELENA Heading, MD;  Location: [facility] ORS;  Service: Orthopedics;  Laterality: Left;   NEPHRECTOMY  1964   Wilms tumor (left)   POLYPECTOMY     TRANSTHORACIC ECHOCARDIOGRAM  06/2020   Normal except grd II DD    Family History  Problem Relation Age of Onset   Hypertension Mother    Heart disease Mother    COPD Mother    Hypertension Father    Heart disease Father    Alzheimer's disease Father    Crohn's disease Father    Colon cancer Father        diagnosed 99's   Colon polyps Sister    Colon polyps Brother    Esophageal cancer Neg Hx    Rectal cancer Neg Hx    Stomach cancer Neg Hx    Ulcerative colitis Neg Hx     Social History   Socioeconomic History   Marital status: Married    Spouse name: Not on file   Number of children: Not on file   Years of education: Not on file   Highest education level: Not on file  Occupational History   Not on file  Tobacco Use   Smoking status: Former    Types: Cigarettes    Passive exposure: Past (MOTHER SMOKER)   Smokeless tobacco: Never   Tobacco comments:    light in college  Vaping Use   Vaping status: Never Used  Substance and Sexual Activity   Alcohol use: Yes    Alcohol/week: 1.0 standard drink of alcohol    Types: 1 Cans  of beer per week    Comment: holidays and special occasions; 3-4 times yearly   Drug use: No    Frequency: 1.0 times per week   Sexual activity: Yes  Other Topics Concern   Not on file  Social History Narrative   Married, one child.   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   Relocated to [place] from [place], [place] 2013.    No Tobacco.  Rare alcohol.  No drug use.    Social Drivers of Dispensing optician  Resource Strain: Not on file  Food Insecurity: Not on file  Transportation Needs: Not on file  Physical Activity: Not on file  Stress: Not on file  Social Connections: Not on file  Intimate Partner Violence: Not on file    Outpatient Medications Prior to Visit  Medication Sig Dispense Refill   acetaminophen  (TYLENOL ) 500 MG tablet Take 1,000 mg by mouth every 6 (six) hours as needed for pain.     acyclovir  ointment (ZOVIRAX ) 5 % Apply 1 Application topically every 3 (three) hours. 5 g 3   calcium carbonate (TUMS - DOSED IN MG ELEMENTAL CALCIUM) 500 MG chewable tablet Chew 1 tablet by mouth daily as needed for indigestion or heartburn.     mometasone (ELOCON) 0.1 % cream Apply topically 2 (two) times daily as needed.     valACYclovir  (VALTREX ) 1000 MG tablet 2 tabs po q12h x 2 doses 4 tablet 6   EPINEPHrine  (AUVI-Q ) 0.3 mg/0.3 mL IJ SOAJ injection Use as directed for severe allergic reaction (Patient not taking: Reported on 02/09/2024) 2 each 2   colchicine  0.6 MG tablet Take 1 tablet (0.6 mg total) by mouth 2 (two) times daily as needed. for gout flare 60 tablet 2   febuxostat  (ULORIC ) 40 MG tablet TAKE ONE TABLET BY MOUTH EVERY MORNING. DUE FOR APPT JULY 90 tablet 0   irbesartan  (AVAPRO ) 150 MG tablet TAKE 1 TABLET(150 MG) BY MOUTH DAILY 90 tablet 0   montelukast  (SINGULAIR ) 10 MG tablet Take 1 tablet (10 mg total) by mouth at bedtime. 14 tablet 0   neomycin -polymyxin-hydrocortisone (CORTISPORIN) 3.5-10000-1 OTIC suspension  (Patient not taking: Reported on 02/09/2024)      predniSONE  (STERAPRED UNI-PAK 21 TAB) 10 MG (21) TBPK tablet Take by mouth daily. Take 6 tabs by mouth daily  for 1 days, then 5 tabs for 1 days, then 4 tabs for 1 days, then 3 tabs for 1 days, 2 tabs for 1 days, then 1 tab by mouth daily for 1 days 21 tablet 0   No facility-administered medications prior to visit.    Allergies  Allergen Reactions   Aldomet [Methyldopa]     Severe bone suppression   Latex Rash    Blisters, rash   Banana Itching   Benzthiazide Other (See Comments)    HCTZ increased gout flares   Kiwi Extract Other (See Comments)    Scratchy throat and itchy ear   Other Other (See Comments)    Scratchy throat and itchy ear Nectarine- itchy throat Kiwi by allergy test  dermabond- vascular reaction   Peach [Prunus Persica] Itching   Plum Pulp Itching   Thiazide-Type Diuretics Other (See Comments)    HCTZ increased gout flares   Crab [Shellfish Allergy] Rash   Tape Rash    Review of Systems  Constitutional:  Negative for appetite change, chills, fatigue and fever.  HENT:  Negative for congestion, dental problem, ear pain and sore throat.   Eyes:  Negative for discharge, redness and visual disturbance.  Respiratory:  Negative for cough, chest tightness, shortness of breath and wheezing.   Cardiovascular:  Negative for chest pain, palpitations and leg swelling.  Gastrointestinal:  Negative for abdominal pain, blood in stool, diarrhea, nausea and vomiting.  Genitourinary:  Negative for difficulty urinating, dysuria, flank pain, frequency, hematuria and urgency.  Musculoskeletal:  Negative for arthralgias, back pain, joint swelling, myalgias and neck stiffness.  Skin:  Negative for pallor and rash.  Neurological:  Negative for dizziness, speech difficulty, weakness and headaches.  Hematological:  Negative for adenopathy. Does not bruise/bleed easily.  Psychiatric/Behavioral:  Negative for confusion and sleep disturbance. The patient is not nervous/anxious.      PE;    02/09/2024    1:27 PM 02/09/2024    1:01 PM 01/21/2024   12:54 PM  Vitals with BMI  Height  5' 5 5' 5  Weight  188 lbs 10 oz 188 lbs  BMI  31.38 31.28  Systolic 130 150 861  Diastolic 78 75 88  Pulse  84 78   Exam chaperoned by Bobbetta Degree, CMA.  Gen: Alert, well appearing.  Patient is oriented to person, place, time, and situation. AFFECT: pleasant, lucid thought and speech. ENT: Ears: EACs clear, normal epithelium.  TMs with good light reflex and landmarks bilaterally.  Eyes: no injection, icteris, swelling, or exudate.  EOMI, PERRLA. Nose: no drainage or turbinate edema/swelling.  No injection or focal lesion.  Mouth: lips without lesion/swelling.  Oral mucosa pink and moist.  Dentition intact and without obvious caries or gingival swelling.  Oropharynx without erythema, exudate, or swelling.  Neck: supple/nontender.  No LAD, mass, or TM.  Carotid pulses 2+ bilaterally, without bruits. CV: RRR, no m/r/g.   LUNGS: CTA bilat, nonlabored resps, good aeration in all lung fields. ABD: soft, NT, ND, BS normal.  No hepatospenomegaly or mass.  No bruits. EXT: no clubbing, cyanosis, or edema.  Musculoskeletal: no joint swelling, erythema, warmth, or tenderness.  ROM of all joints intact. Skin - no sores or suspicious lesions or rashes or color changes  Pertinent labs:  Lab Results  Component Value Date   TSH 3.14 05/29/2021   Lab Results  Component Value Date   WBC 6.7 02/03/2023   HGB 12.6 02/03/2023   HCT 38.8 02/03/2023   MCV 94.1 02/03/2023   PLT 198.0 02/03/2023   Lab Results  Component Value Date   CREATININE 1.16 02/03/2023   BUN 21 02/03/2023   NA 139 02/03/2023   K 4.5 02/03/2023   CL 108 02/03/2023   CO2 25 02/03/2023   Lab Results  Component Value Date   ALT 16 02/03/2023   AST 19 02/03/2023   ALKPHOS 80 02/03/2023   BILITOT 0.8 02/03/2023   Lab Results  Component Value Date   CHOL 167 02/03/2023   Lab Results  Component Value Date    HDL 51.90 02/03/2023   Lab Results  Component Value Date   LDLCALC 95 02/03/2023   Lab Results  Component Value Date   TRIG 101.0 02/03/2023   Lab Results  Component Value Date   CHOLHDL 3 02/03/2023   Lab Results  Component Value Date   HGBA1C 5.8 02/04/2023   ASSESSMENT AND PLAN:   #1 health maintenance exam: Reviewed age and gender appropriate health maintenance issues (prudent diet, regular exercise, health risks of tobacco and excessive alcohol, use of seatbelts, fire alarms in home, use of sunscreen).  Also reviewed age and gender appropriate health screening as well as vaccine recommendations. Vaccines:  Shingrix-> she deferred for now. Labs: fasting HP Cervical ca screening: per GYN. Breast ca screening: UTD 01/20/24, normal mammo. Colon ca screening: hx colon ca + resection, last colonoscopy 05/16/22 with multiple polyps, repeat colonoscopy in 2 to 3 years was recommended. Osteoporosis screening: Her bone density scan on 01/20/2024 showed T-score of -2.0 in the left hip.  Plan repeat 2 years.  #2 hypertension, well-controlled on irbesartan  150 mg a day. Electrolytes and creatinine monitoring today.  3.  Prediabetes. Her last hemoglobin A1c  was 5.8% 1 year ago. Fasting glucose and hemoglobin A1c today.  4.  Aortic atherosclerosis. Her cardiologist has recommended statin.  Goal LDL less than 60. Monitor lipids today.  She is still thinking about the recommendation of statin.  #5 gout. Long-term stability on Uloric  40 mg daily.  An After Visit Summary was printed and given to the patient.  FOLLOW UP:  Return in about 1 year (around 02/08/2025) for annual CPE (fasting).  Signed:  Gerlene Hockey, MD           02/09/2024

## 2024-02-10 ENCOUNTER — Ambulatory Visit: Payer: Self-pay | Admitting: Family Medicine

## 2024-02-10 DIAGNOSIS — R7989 Other specified abnormal findings of blood chemistry: Secondary | ICD-10-CM

## 2024-02-10 DIAGNOSIS — E875 Hyperkalemia: Secondary | ICD-10-CM

## 2024-02-10 DIAGNOSIS — N183 Chronic kidney disease, stage 3 unspecified: Secondary | ICD-10-CM

## 2024-02-10 LAB — HEMOGLOBIN A1C
Hgb A1c MFr Bld: 5.9 % — ABNORMAL HIGH (ref <5.7)
Mean Plasma Glucose: 123 mg/dL
eAG (mmol/L): 6.8 mmol/L

## 2024-02-10 LAB — COMPREHENSIVE METABOLIC PANEL WITH GFR
AG Ratio: 1.6 (calc) (ref 1.0–2.5)
ALT: 21 U/L (ref 6–29)
AST: 23 U/L (ref 10–35)
Albumin: 4.4 g/dL (ref 3.6–5.1)
Alkaline phosphatase (APISO): 90 U/L (ref 37–153)
BUN/Creatinine Ratio: 18 (calc) (ref 6–22)
BUN: 25 mg/dL (ref 7–25)
CO2: 26 mmol/L (ref 20–32)
Calcium: 10 mg/dL (ref 8.6–10.4)
Chloride: 102 mmol/L (ref 98–110)
Creat: 1.36 mg/dL — ABNORMAL HIGH (ref 0.50–1.05)
Globulin: 2.7 g/dL (ref 1.9–3.7)
Glucose, Bld: 102 mg/dL — ABNORMAL HIGH (ref 65–99)
Potassium: 5.5 mmol/L — ABNORMAL HIGH (ref 3.5–5.3)
Sodium: 136 mmol/L (ref 135–146)
Total Bilirubin: 0.7 mg/dL (ref 0.2–1.2)
Total Protein: 7.1 g/dL (ref 6.1–8.1)
eGFR: 44 mL/min/{1.73_m2} — ABNORMAL LOW (ref >=60)

## 2024-02-10 LAB — LIPID PANEL
Cholesterol: 186 mg/dL (ref <200)
HDL: 58 mg/dL (ref >=50)
LDL Cholesterol (Calc): 102 mg/dL — ABNORMAL HIGH
Non-HDL Cholesterol (Calc): 128 mg/dL (ref <130)
Total CHOL/HDL Ratio: 3.2 (calc) (ref <5.0)
Triglycerides: 160 mg/dL — ABNORMAL HIGH (ref <150)

## 2024-02-10 LAB — CBC WITH DIFFERENTIAL/PLATELET
Absolute Lymphocytes: 1973 {cells}/uL (ref 850–3900)
Absolute Monocytes: 429 {cells}/uL (ref 200–950)
Basophils Absolute: 53 {cells}/uL (ref 0–200)
Basophils Relative: 0.8 %
Eosinophils Absolute: 198 {cells}/uL (ref 15–500)
Eosinophils Relative: 3 %
HCT: 41.2 % (ref 35.0–45.0)
Hemoglobin: 13.5 g/dL (ref 11.7–15.5)
MCH: 31 pg (ref 27.0–33.0)
MCHC: 32.8 g/dL (ref 32.0–36.0)
MCV: 94.7 fL (ref 80.0–100.0)
MPV: 10.4 fL (ref 7.5–12.5)
Monocytes Relative: 6.5 %
Neutro Abs: 3947 {cells}/uL (ref 1500–7800)
Neutrophils Relative %: 59.8 %
Platelets: 216 10*3/uL (ref 140–400)
RBC: 4.35 10*6/uL (ref 3.80–5.10)
RDW: 13.4 % (ref 11.0–15.0)
Total Lymphocyte: 29.9 %
WBC: 6.6 10*3/uL (ref 3.8–10.8)

## 2024-02-12 ENCOUNTER — Other Ambulatory Visit: Payer: Self-pay | Admitting: Family Medicine

## 2024-02-12 DIAGNOSIS — I10 Essential (primary) hypertension: Secondary | ICD-10-CM

## 2024-02-19 ENCOUNTER — Other Ambulatory Visit

## 2024-02-23 ENCOUNTER — Other Ambulatory Visit (INDEPENDENT_AMBULATORY_CARE_PROVIDER_SITE_OTHER)

## 2024-02-23 DIAGNOSIS — N183 Chronic kidney disease, stage 3 unspecified: Secondary | ICD-10-CM

## 2024-02-23 DIAGNOSIS — R7989 Other specified abnormal findings of blood chemistry: Secondary | ICD-10-CM

## 2024-02-23 DIAGNOSIS — E875 Hyperkalemia: Secondary | ICD-10-CM | POA: Diagnosis not present

## 2024-02-23 LAB — BASIC METABOLIC PANEL WITH GFR
BUN: 20 mg/dL (ref 6–23)
CO2: 26 meq/L (ref 19–32)
Calcium: 9.5 mg/dL (ref 8.4–10.5)
Chloride: 102 meq/L (ref 96–112)
Creatinine, Ser: 1.27 mg/dL — ABNORMAL HIGH (ref 0.40–1.20)
GFR: 44.96 mL/min — ABNORMAL LOW (ref >60.00)
Glucose, Bld: 90 mg/dL (ref 70–99)
Potassium: 4.5 meq/L (ref 3.5–5.1)
Sodium: 136 meq/L (ref 135–145)

## 2024-02-24 ENCOUNTER — Ambulatory Visit: Payer: Self-pay | Admitting: Family Medicine

## 2024-02-26 NOTE — Telephone Encounter (Signed)
 Communication  Reason for CRM: pt called in and I relayed lab results. Pt verbalized understanding.    Noted. No further action needed at this time.

## 2024-03-02 ENCOUNTER — Ambulatory Visit (HOSPITAL_BASED_OUTPATIENT_CLINIC_OR_DEPARTMENT_OTHER)

## 2024-03-30 ENCOUNTER — Ambulatory Visit (HOSPITAL_BASED_OUTPATIENT_CLINIC_OR_DEPARTMENT_OTHER)
Admission: RE | Admit: 2024-03-30 | Discharge: 2024-03-30 | Disposition: A | Discharge status: Home / Self Care | Source: Ambulatory Visit | Attending: Family Medicine | Admitting: Family Medicine

## 2024-03-30 DIAGNOSIS — I5030 Unspecified diastolic (congestive) heart failure: Secondary | ICD-10-CM | POA: Diagnosis not present

## 2024-03-30 DIAGNOSIS — R011 Cardiac murmur, unspecified: Secondary | ICD-10-CM | POA: Insufficient documentation

## 2024-03-30 LAB — ECHOCARDIOGRAM COMPLETE
AR max vel: 2.4 cm2
AV Area VTI: 2.73 cm2
AV Area mean vel: 2.43 cm2
AV Mean grad: 2 mmHg
AV Peak grad: 3.8 mmHg
Ao pk vel: 0.97 m/s
Area-P 1/2: 4.17 cm2
Calc EF: 60.9 %
S' Lateral: 2.2 cm
Single Plane A2C EF: 61.4 %
Single Plane A4C EF: 59.9 %

## 2024-03-31 ENCOUNTER — Ambulatory Visit: Payer: Self-pay | Admitting: Cardiology

## 2024-04-07 ENCOUNTER — Encounter: Payer: Self-pay | Admitting: Family Medicine

## 2024-04-07 NOTE — Progress Notes (Signed)
EMR updated.

## 2024-05-24 ENCOUNTER — Ambulatory Visit

## 2024-05-25 ENCOUNTER — Ambulatory Visit (INDEPENDENT_AMBULATORY_CARE_PROVIDER_SITE_OTHER)

## 2024-05-25 DIAGNOSIS — Z23 Encounter for immunization: Secondary | ICD-10-CM | POA: Diagnosis not present

## 2024-05-25 NOTE — Progress Notes (Signed)
 Pt in for regular dose flu vaccine.   Injection tolerated well.  Vaccine handout given

## 2025-02-09 ENCOUNTER — Encounter: Admitting: Family Medicine
